# Patient Record
Sex: Female | Born: 1990 | Race: Black or African American | Hispanic: No | Marital: Single | Smoking: Former smoker
Health system: Southern US, Community
[De-identification: ages and names within clinical notes are randomized; demographics above are authoritative.]

## PROBLEM LIST (undated history)

## (undated) ENCOUNTER — Inpatient Hospital Stay (HOSPITAL_COMMUNITY): Payer: Self-pay

## (undated) DIAGNOSIS — B999 Unspecified infectious disease: Secondary | ICD-10-CM

## (undated) DIAGNOSIS — R87629 Unspecified abnormal cytological findings in specimens from vagina: Secondary | ICD-10-CM

## (undated) DIAGNOSIS — A749 Chlamydial infection, unspecified: Secondary | ICD-10-CM

## (undated) DIAGNOSIS — N390 Urinary tract infection, site not specified: Secondary | ICD-10-CM

## (undated) DIAGNOSIS — I2699 Other pulmonary embolism without acute cor pulmonale: Secondary | ICD-10-CM

## (undated) DIAGNOSIS — I10 Essential (primary) hypertension: Secondary | ICD-10-CM

## (undated) HISTORY — PX: COLPOSCOPY: SHX161

## (undated) HISTORY — DX: Morbid (severe) obesity due to excess calories: E66.01

---
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
## 2009-04-24 ENCOUNTER — Emergency Department (HOSPITAL_COMMUNITY): Admission: EM | Admit: 2009-04-24 | Discharge: 2009-04-24 | Payer: Self-pay | Admitting: Emergency Medicine

## 2009-06-18 ENCOUNTER — Emergency Department (HOSPITAL_BASED_OUTPATIENT_CLINIC_OR_DEPARTMENT_OTHER): Admission: EM | Admit: 2009-06-18 | Discharge: 2009-06-18 | Payer: Self-pay | Admitting: Emergency Medicine

## 2009-06-18 ENCOUNTER — Ambulatory Visit: Payer: Self-pay | Admitting: Interventional Radiology

## 2010-09-17 NOTE — H&P (Signed)
Faith [name], Faith [name] NO.:  [id number]   MEDICAL RECORD NO.:  [id number]          PATIENT TYPE:  INP   LOCATION:  9372                          [facility]ILITY:  [facility]   PHYSICIAN:  Kathreen Cosier, M.D.DATE OF BIRTH:  04-25-1991   DATE OF ADMISSION:  12/21/2006  DATE OF DISCHARGE:                              HISTORY & PHYSICAL   The patient is a 20 year old primigravida, ADC December 19, 2006.  She was  admitted in labor.  GBS was negative.  At 7:20 a.m. on December 21, 2006,  her cervix was 6 cm, 100%, vertex -1.  Membranes were artificially  ruptured.  Her fluid was clear.  An IUPC was inserted.  She had an  epidural at that time.  The patient, at 11:55 a.m., was __________  and  molded to +1 station.  Potassium was 3.1.  She was started on 40 mEq of  potassium IV.  Her diastolic blood pressure was up to 103, and she was  started on magnesium sulfate 4 g loading, 2 g per hour.  PIH labs were  normal.  By 4 p.m., she had pushed for 2 hours and 40 minutes and had  not brought it down beyond a +1-2 station with a lot of molding.  It was  decided that she would delivery by C-section for CPD.   PHYSICAL EXAMINATION:  GENERAL:  Well-developed female in labor.  HEENT:  Negative.  LUNGS:  Clear.  HEART:  Regular rhythm.  No murmurs or gallops.  BREASTS:  No masses.  ABDOMEN:  Term.  Estimated fetal weight 7 pounds 7 ounces.  PELVIC:  As described above.  EXTREMITIES:  Negative.           ______________________________  Kathreen Cosier, M.D.     BAM/MEDQ  D:  12/21/2006  T:  12/22/2006  Job:  [id number]

## 2010-09-17 NOTE — Op Note (Signed)
NAMEANGLES, TREVIZO NO.:  [id number]   MEDICAL RECORD NO.:  [id number]          PATIENT TYPE:  INP   LOCATION:  9372                          [facility]   PHYSICIAN:  Kathreen Cosier, M.D.DATE OF BIRTH:  06-Jun-1990   DATE OF PROCEDURE:  12/21/2006  DATE OF DISCHARGE:                               OPERATIVE REPORT   PREOPERATIVE DIAGNOSIS:  Cephalopelvic disproportion.   POSTOPERATIVE DIAGNOSIS:   OPERATION PERFORMED:   SURGEON:  Kathreen Cosier, M.D.   ANESTHESIA:  Epidural.   DESCRIPTION OF PROCEDURE:  Patient placed on the operating table in  supine position.  Abdomen prepped and draped.  Bladder emptied with  Foley catheter.  Transverse suprapubic incision made and carried down to  the rectus fascia.  Fascia cleaned and incised the length of the  incision.  Recti muscles were retracted laterally.  Peritoneum incised  longitudinally.  Transverse incision made in viscera peritoneum over the  bladder.  Bladder mobilized and retrieved.  Transverse lower uterine  incision made.  It was not possible to deliver the baby abdominally, so  the patient's legs were frogged and the nurse pushed on the vertex from  below until delivery was effected above.  She had a female, Apgars 9 and  9 weighing 8 pounds 15 ounces from the OP position.  Fluid was clear.  Team was in attendance.  Placenta was anterior mobile, sent to labor and  delivery.  Uterine cavity clean and dry.  Uterine incision closed in one  layer with continuous suture of #1 chromic.  Hemostasis satisfactory.  Bladder flap reattached with 2-0 chromic.  Uterus was contracted.  Tubes  and ovaries normal.  Abdomen closed in layers, peritoneum continuous  suture of 0 chromic, fascia continuous suture 0 Dexon.  Skin closed with  subcuticular stitch of 4-0 Monocryl. Blood loss was 500 mL.  The patient  tolerated procedure well, taken to recovery room in good condition.     ______________________________  Kathreen Cosier, M.D.     BAM/MEDQ  D:  12/21/2006  T:  12/22/2006  Job:  [id number]

## 2010-09-20 NOTE — Discharge Summary (Signed)
NAMETYNETTA, BACHMANN NO.:  [id number]   MEDICAL RECORD NO.:  [id number]          PATIENT TYPE:  INP   LOCATION:  9126                          [facility]   PHYSICIAN:  Kathreen Cosier, M.D.DATE OF BIRTH:  07-13-1990   DATE OF ADMISSION:  12/21/2006  DATE OF DISCHARGE:  12/24/2006                               DISCHARGE SUMMARY   Patient is a 20 year old primigravida, EDC December 19, 2006; negative  GBS, who was admitted in labor, 6 cm, 100%, vertex, -1.  Patient  developed PIH and was started on magnesium sulfate 4 g loading, 2 g per  hour.  After she became fully dilated, she pushed for two hours to +2  station and there was no progress.  She was delivered by C-section of a  female, Apgars 9 and 9, weight 8 pounds 15 ounces, from the OP position.  Postoperative hemoglobin was 7.9 and diastolics were between 90 and 70  and her magnesium sulfate was continued until day #2, when she was  started on ferrous sulfate twice daily.  She did well and the third day  she was discharged home on Tylox one p.o. q. 4 hours p.r.n., ferrous  sulfate one p.o. daily.   DISCHARGE DIAGNOSES:  Status post primary low transverse cesarean  section, term, for cephalopelvic disproportion and pregnancy-induced  hypertension.           ______________________________  Kathreen Cosier, M.D.     BAM/MEDQ  D:  01/20/2007  T:  01/20/2007  Job:  [id number]

## 2011-02-03 LAB — DIFFERENTIAL
Basophils Absolute: 0
Basophils Relative: 0
Lymphocytes Relative: 11 — ABNORMAL LOW
Monocytes Absolute: 0.6
Monocytes Relative: 5
Neutro Abs: 10.2 — ABNORMAL HIGH
Neutrophils Relative %: 84 — ABNORMAL HIGH

## 2011-02-03 LAB — RAPID URINE DRUG SCREEN, HOSP PERFORMED
Amphetamines: NOT DETECTED
Barbiturates: NOT DETECTED
Benzodiazepines: NOT DETECTED
Opiates: NOT DETECTED
Tetrahydrocannabinol: NOT DETECTED

## 2011-02-03 LAB — RAPID HIV SCREEN (WH-MAU): Rapid HIV Screen: NONREACTIVE

## 2011-02-03 LAB — URINALYSIS, ROUTINE W REFLEX MICROSCOPIC
Ketones, ur: 40 — AB
Nitrite: POSITIVE — AB
Protein, ur: 100 — AB
Specific Gravity, Urine: 1.02
Urobilinogen, UA: 1

## 2011-02-03 LAB — COMPREHENSIVE METABOLIC PANEL
ALT: 9
AST: 17
Alkaline Phosphatase: 180 — ABNORMAL HIGH
CO2: 21
Calcium: 9
Chloride: 106
GFR calc Af Amer: >60
GFR calc non Af Amer: >60
Potassium: 3.9
Sodium: 135

## 2011-02-03 LAB — RPR: RPR Ser Ql: NONREACTIVE

## 2011-02-03 LAB — CBC
HCT: 27.1 — ABNORMAL LOW
Hemoglobin: 8.7 — ABNORMAL LOW
MCV: 78.7
Platelets: 197
RBC: 3.44 — ABNORMAL LOW
RBC: 4.44
WBC: 12.2 — ABNORMAL HIGH
WBC: 14.9 — ABNORMAL HIGH

## 2011-02-03 LAB — RUBELLA SCREEN: Rubella: 43.3 — ABNORMAL HIGH

## 2011-02-03 LAB — URINE CULTURE: Colony Count: >=100000

## 2011-02-03 LAB — URINE MICROSCOPIC-ADD ON

## 2011-02-03 LAB — TYPE AND SCREEN

## 2011-02-14 LAB — URINE MICROSCOPIC-ADD ON

## 2011-02-14 LAB — COMPREHENSIVE METABOLIC PANEL
ALT: <8
ALT: <8
AST: 20
AST: 24
Albumin: 1.9 — ABNORMAL LOW
Albumin: 2.8 — ABNORMAL LOW
Alkaline Phosphatase: 118
BUN: 1 — ABNORMAL LOW
CO2: 22
Calcium: 9.1
Chloride: 105
Chloride: 107
Chloride: 108
Creatinine, Ser: 0.47
Creatinine, Ser: 0.59
Creatinine, Ser: 0.71
Potassium: 3.7
Sodium: 137
Sodium: 138
Total Bilirubin: 0.5
Total Bilirubin: 0.6
Total Bilirubin: 0.6

## 2011-02-14 LAB — URINALYSIS, ROUTINE W REFLEX MICROSCOPIC
Glucose, UA: NEGATIVE
Ketones, ur: NEGATIVE
Protein, ur: >300 — AB

## 2011-02-14 LAB — CBC
HCT: 30.3 — ABNORMAL LOW
Hemoglobin: 10.4 — ABNORMAL LOW
MCHC: 33.6
MCHC: 33.7
MCV: 81.1 — ABNORMAL LOW
Platelets: 179
RBC: 3.73 — ABNORMAL LOW
RBC: 3.8
WBC: 11.4 — ABNORMAL HIGH
WBC: 13.1 — ABNORMAL HIGH
WBC: 23.1 — ABNORMAL HIGH

## 2011-02-14 LAB — RPR: RPR Ser Ql: NONREACTIVE

## 2011-02-14 LAB — URINE CULTURE: Colony Count: 4000

## 2011-02-14 LAB — LACTATE DEHYDROGENASE
LDH: 123
LDH: 297 — ABNORMAL HIGH

## 2011-02-14 LAB — URIC ACID: Uric Acid, Serum: 5.3

## 2011-04-14 ENCOUNTER — Encounter (HOSPITAL_COMMUNITY): Payer: Self-pay | Admitting: *Deleted

## 2011-04-14 ENCOUNTER — Inpatient Hospital Stay (HOSPITAL_COMMUNITY)
Admission: AD | Admit: 2011-04-14 | Discharge: 2011-04-14 | Disposition: A | Discharge status: Home / Self Care | Payer: Self-pay | Source: Ambulatory Visit | Attending: Obstetrics & Gynecology | Admitting: Obstetrics & Gynecology

## 2011-04-14 DIAGNOSIS — R102 Pelvic and perineal pain: Secondary | ICD-10-CM

## 2011-04-14 DIAGNOSIS — N72 Inflammatory disease of cervix uteri: Secondary | ICD-10-CM | POA: Insufficient documentation

## 2011-04-14 DIAGNOSIS — N949 Unspecified condition associated with female genital organs and menstrual cycle: Secondary | ICD-10-CM | POA: Insufficient documentation

## 2011-04-14 DIAGNOSIS — Z30432 Encounter for removal of intrauterine contraceptive device: Secondary | ICD-10-CM | POA: Insufficient documentation

## 2011-04-14 DIAGNOSIS — A5901 Trichomonal vulvovaginitis: Secondary | ICD-10-CM | POA: Insufficient documentation

## 2011-04-14 DIAGNOSIS — R109 Unspecified abdominal pain: Secondary | ICD-10-CM | POA: Insufficient documentation

## 2011-04-14 HISTORY — DX: Urinary tract infection, site not specified: N39.0

## 2011-04-14 HISTORY — DX: Essential (primary) hypertension: I10

## 2011-04-14 HISTORY — DX: Chlamydial infection, unspecified: A74.9

## 2011-04-14 LAB — URINE MICROSCOPIC-ADD ON

## 2011-04-14 LAB — DIFFERENTIAL
Basophils Absolute: 0 10*3/uL (ref 0.0–0.1)
Basophils Relative: 0 % (ref 0–1)
Neutro Abs: 5.1 10*3/uL (ref 1.7–7.7)
Neutrophils Relative %: 58 % (ref 43–77)

## 2011-04-14 LAB — URINALYSIS, ROUTINE W REFLEX MICROSCOPIC
Bilirubin Urine: NEGATIVE
Glucose, UA: NEGATIVE mg/dL
Ketones, ur: NEGATIVE mg/dL
Nitrite: NEGATIVE
Protein, ur: NEGATIVE mg/dL
Specific Gravity, Urine: 1.01 (ref 1.005–1.030)
Urobilinogen, UA: 0.2 mg/dL (ref 0.0–1.0)
pH: 7 (ref 5.0–8.0)

## 2011-04-14 LAB — POCT PREGNANCY, URINE: Preg Test, Ur: NEGATIVE

## 2011-04-14 LAB — CBC
Hemoglobin: 12.4 g/dL (ref 12.0–15.0)
MCHC: 31.9 g/dL (ref 30.0–36.0)
Platelets: 277 10*3/uL (ref 150–400)
RDW: 13.7 % (ref 11.5–15.5)

## 2011-04-14 LAB — WET PREP, GENITAL: Yeast Wet Prep HPF POC: NONE SEEN

## 2011-04-14 MED ORDER — CEFTRIAXONE SODIUM 250 MG IJ SOLR
250.0000 mg | Freq: Once | INTRAMUSCULAR | Status: AC
  Administered 2011-04-14: 250 mg via INTRAMUSCULAR
  Filled 2011-04-14: qty 250

## 2011-04-14 MED ORDER — METRONIDAZOLE 500 MG PO TABS: 500.0000 mg | ORAL_TABLET | Freq: Two times a day (BID) | ORAL | Status: AC

## 2011-04-14 MED ORDER — AZITHROMYCIN 250 MG PO TABS
1000.0000 mg | ORAL_TABLET | Freq: Once | ORAL | Status: AC
  Administered 2011-04-14: 1000 mg via ORAL
  Filled 2011-04-14: qty 1
  Filled 2011-04-14: qty 3

## 2011-04-14 MED ORDER — KETOROLAC TROMETHAMINE 60 MG/2ML IM SOLN
60.0000 mg | Freq: Once | INTRAMUSCULAR | Status: AC
  Administered 2011-04-14: 60 mg via INTRAMUSCULAR
  Filled 2011-04-14: qty 2

## 2011-04-14 NOTE — ED Provider Notes (Signed)
History     CSN: [id number] Arrival date & time: 04/14/2011  3:06 PM   None     Chief Complaint  Patient presents with  . Abdominal Pain    HPI Faith [name] is a 20 y.o. female who presents to [facility] for [facility]. [facility] and has been having problems for the past 6 months with cramping and pain with sexual intercourse, spotting. Complains of nausea. The history was provided by the patient.  Past Medical History  Diagnosis Date  . Hypertension   . Urinary tract infection   . Chlamydia     Past Surgical History  Procedure Date  . Cesarean section     Family History  Problem Relation Age of Onset  . Anesthesia problems Neg Hx     History  Substance Use Topics  . Smoking status: Current Some Day Smoker -- 0.2 packs/day  . Smokeless tobacco: Never Used  . Alcohol Use: Yes     Every other weekend    OB History    Grav Para Term Preterm Abortions TAB SAB Ect Mult Living   2 2 2  0 0 0 0 0 0 2      Review of Systems  Constitutional: Negative for fever, chills, diaphoresis and fatigue.  HENT: Negative for ear pain, congestion, sore throat, facial swelling, neck pain, neck stiffness, dental problem and sinus pressure.   Eyes: Negative for photophobia, pain and discharge.  Respiratory: Negative for cough, chest tightness and wheezing.   Cardiovascular: Negative.   Gastrointestinal: Positive for nausea. Negative for vomiting, diarrhea, constipation and abdominal distention.       Cramping  Genitourinary: Positive for vaginal bleeding, vaginal discharge and pelvic pain. Negative for dysuria, frequency, flank pain and difficulty urinating.  Musculoskeletal: Positive for back pain. Negative for myalgias and gait problem.  Skin: Negative for color change and rash.  Neurological: Negative for dizziness, speech difficulty, weakness, light-headedness, numbness and headaches.  Psychiatric/Behavioral: Negative for confusion and  agitation. The patient is not nervous/anxious.     Allergies  Review of patient's allergies indicates no known allergies.  Home Medications  No current outpatient prescriptions on file.  BP 119/79  Pulse 86  Temp(Src) 98.5 F (36.9 C) (Oral)  Resp 20  Ht 5\' 2"  (1.575 m)  Wt 215 lb 9.6 oz (97.796 kg)  BMI 39.43 kg/m2  SpO2 99%  Physical Exam  Nursing note and vitals reviewed. Constitutional: She is oriented to person, place, and time. She appears well-developed and well-nourished.  HENT:  Head: Normocephalic.  Eyes: EOM are normal.  Neck: Neck supple.  Cardiovascular: Normal rate.   Pulmonary/Chest: Effort normal.  Abdominal: Soft. There is no tenderness.  Genitourinary:       External genitalia without lesions. Scant blood vaginal vault, yellow discharge, cervix friable, positive CMT, no adnexal mass palpated. Uterus without palpable enlargement. IUD string visualized.  Musculoskeletal: Normal range of motion.  Neurological: She is alert and oriented to person, place, and time. No cranial nerve deficit.  Skin: Skin is warm and dry.  Psychiatric: She has a normal mood and affect. Her behavior is normal. Judgment and thought content normal.   Assessment: Cervicitis   Pelvic pain   Trichomonas    Plan:  IUD removal   Rocephin 250 mg IM   Zithromax 1 gram po   Toradol 60 mg IM   GC, Chlamydia cultures pending   Rx Flagyl   ED Course  Procedures  Using ring forceps IUD removed without difficulty. Discussed in detail with patient need for alternative birth control since IUD has been removed. She will follow up with [facility].   MDM          Kerrie Buffalo, NP 04/14/11 [phone]

## 2011-04-14 NOTE — Progress Notes (Signed)
Patient states she has had a Mirena since 2009 that was placed by [facility]. Had an appointment today and was late and could not be seen. States she has been having abdominal pain for a while and wants the IUD taken out. Having pain with intercourse.

## 2011-04-15 LAB — GC/CHLAMYDIA PROBE AMP, GENITAL
Chlamydia, DNA Probe: POSITIVE — AB
GC Probe Amp, Genital: NEGATIVE

## 2011-04-16 ENCOUNTER — Telehealth (HOSPITAL_COMMUNITY): Payer: Self-pay | Admitting: Nurse Practitioner

## 2011-04-16 NOTE — Telephone Encounter (Signed)
Telephone call to patient regarding positive chlamydia culture, patient notified.  Patient was treated at time of visit.  Instructed patient to notify her partner for treatment.  Report of treatment [facility]ed to [facility].  

## 2011-04-17 ENCOUNTER — Other Ambulatory Visit: Payer: Self-pay | Admitting: Obstetrics & Gynecology

## 2011-04-17 ENCOUNTER — Telehealth: Payer: Self-pay | Admitting: *Deleted

## 2011-04-17 DIAGNOSIS — A749 Chlamydial infection, unspecified: Secondary | ICD-10-CM

## 2011-04-17 MED ORDER — AZITHROMYCIN 1 G PO PACK: 1.0000 | PACK | Freq: Once | ORAL | Status: AC

## 2011-04-17 NOTE — Telephone Encounter (Signed)
Message left for pt that I am calling with test results and treatment information. Please call back and leave a message of when we can speak with you.  *Note: pt needs to be informed of + chlamydia, Rx to be called in as prescribed and partner needs treatment.

## 2011-04-17 NOTE — Telephone Encounter (Signed)
Message copied by Jill Side on Thu Apr 17, 2011  4:10 PM ------      Message from: Adam Phenix      Created: Thu Apr 17, 2011  9:03 AM       Pos CT, needs tx with Zithromax, ordered

## 2011-04-22 NOTE — Telephone Encounter (Signed)
Upon chart review there is a note from [facility] stating that patient was already treated at the time of visit and was notified. See Tel Note from [facility].

## 2011-06-09 ENCOUNTER — Telehealth: Payer: Self-pay | Admitting: Obstetrics and Gynecology

## 2011-06-09 NOTE — Telephone Encounter (Signed)
Returned call to patient. No answer- left message that we are returning call and to call us back. In her message that she left; there was no request other than for [place] to call her back.

## 2011-06-10 NOTE — Telephone Encounter (Signed)
Called pt and left message indicating that this is our second attempt on returning her call.  If she still has questions or concerns to please give [place] a call and leave a message on our nurse callback line.

## 2011-06-15 ENCOUNTER — Encounter (HOSPITAL_COMMUNITY): Payer: Self-pay | Admitting: Adult Health

## 2011-06-15 ENCOUNTER — Emergency Department (HOSPITAL_COMMUNITY)
Admission: EM | Admit: 2011-06-15 | Discharge: 2011-06-15 | Disposition: A | Discharge status: Home / Self Care | Payer: Self-pay | Attending: Emergency Medicine | Admitting: Emergency Medicine

## 2011-06-15 DIAGNOSIS — H5789 Other specified disorders of eye and adnexa: Secondary | ICD-10-CM | POA: Insufficient documentation

## 2011-06-15 DIAGNOSIS — F17200 Nicotine dependence, unspecified, uncomplicated: Secondary | ICD-10-CM | POA: Insufficient documentation

## 2011-06-15 DIAGNOSIS — R300 Dysuria: Secondary | ICD-10-CM | POA: Insufficient documentation

## 2011-06-15 DIAGNOSIS — R599 Enlarged lymph nodes, unspecified: Secondary | ICD-10-CM | POA: Insufficient documentation

## 2011-06-15 DIAGNOSIS — J029 Acute pharyngitis, unspecified: Secondary | ICD-10-CM | POA: Insufficient documentation

## 2011-06-15 DIAGNOSIS — H109 Unspecified conjunctivitis: Secondary | ICD-10-CM | POA: Insufficient documentation

## 2011-06-15 DIAGNOSIS — I10 Essential (primary) hypertension: Secondary | ICD-10-CM | POA: Insufficient documentation

## 2011-06-15 DIAGNOSIS — H5710 Ocular pain, unspecified eye: Secondary | ICD-10-CM | POA: Insufficient documentation

## 2011-06-15 DIAGNOSIS — N390 Urinary tract infection, site not specified: Secondary | ICD-10-CM | POA: Insufficient documentation

## 2011-06-15 DIAGNOSIS — H579 Unspecified disorder of eye and adnexa: Secondary | ICD-10-CM | POA: Insufficient documentation

## 2011-06-15 DIAGNOSIS — R10819 Abdominal tenderness, unspecified site: Secondary | ICD-10-CM | POA: Insufficient documentation

## 2011-06-15 DIAGNOSIS — H02849 Edema of unspecified eye, unspecified eyelid: Secondary | ICD-10-CM | POA: Insufficient documentation

## 2011-06-15 LAB — URINALYSIS, ROUTINE W REFLEX MICROSCOPIC
Bilirubin Urine: NEGATIVE
Hgb urine dipstick: NEGATIVE
Specific Gravity, Urine: 1.032 — ABNORMAL HIGH (ref 1.005–1.030)
pH: 7 (ref 5.0–8.0)

## 2011-06-15 LAB — URINE MICROSCOPIC-ADD ON

## 2011-06-15 MED ORDER — PENICILLIN G BENZATHINE & PROC 1200000 UNIT/2ML IM SUSP: 1.2000 10*6.[IU] | Freq: Once | INTRAMUSCULAR | Status: DC

## 2011-06-15 MED ORDER — POLYMYXIN B-TRIMETHOPRIM 10000-0.1 UNIT/ML-% OP SOLN: 2.0000 [drp] | OPHTHALMIC | Status: AC

## 2011-06-15 MED ORDER — PHENAZOPYRIDINE HCL 200 MG PO TABS: 200.0000 mg | ORAL_TABLET | Freq: Three times a day (TID) | ORAL | Status: AC

## 2011-06-15 MED ORDER — PENICILLIN G BENZATHINE 1200000 UNIT/2ML IM SUSP: 1.2000 10*6.[IU] | Freq: Once | INTRAMUSCULAR | Status: DC

## 2011-06-15 MED ORDER — SULFAMETHOXAZOLE-TRIMETHOPRIM 800-160 MG PO TABS: 1.0000 | ORAL_TABLET | Freq: Two times a day (BID) | ORAL | Status: AC

## 2011-06-15 MED ORDER — PENICILLIN G BENZATHINE 1200000 UNIT/2ML IM SUSP
1.2000 10*6.[IU] | Freq: Once | INTRAMUSCULAR | Status: AC
  Administered 2011-06-15: 1.2 10*6.[IU] via INTRAMUSCULAR
  Filled 2011-06-15: qty 2

## 2011-06-15 NOTE — ED Provider Notes (Signed)
History     CSN: [id number]  Arrival date & time 06/15/11  1326   First MD Initiated Contact with Patient 06/15/11 1414      Chief Complaint  Patient presents with  . Sore Throat  . Conjunctivitis    (Consider location/radiation/quality/duration/timing/severity/associated sxs/prior treatment) HPI Comments: Patient reports she has had sore throat for one week, with pain with swallowing. She has also had dysuria for one week. Yesterday she began to have pain, itching, swelling and discharge from her right eye.  Patient states that her throat is sore but she is not having difficulty eating or drinking. Patient says she is having dysuria but is not having urinary frequency or urgency. She is also not having fevers, vomiting, abdominal pain, or abnormal vaginal discharge or bleeding. Last menstrual period was February 1.    Patient is a 21 y.o. female presenting with pharyngitis and conjunctivitis. The history is provided by the patient.  Sore Throat  Conjunctivitis   Sore Throat    Past Medical History  Diagnosis Date  . Hypertension   . Urinary tract infection   . Chlamydia     Past Surgical History  Procedure Date  . Cesarean section     Family History  Problem Relation Age of Onset  . Anesthesia problems Neg Hx     History  Substance Use Topics  . Smoking status: Current Some Day Smoker -- 0.2 packs/day  . Smokeless tobacco: Never Used  . Alcohol Use: Yes     Every other weekend    OB History    Grav Para Term Preterm Abortions TAB SAB Ect Mult Living   2 2 2  0 0 0 0 0 0 2      Review of Systems  All other systems reviewed and are negative.    Allergies  Review of patient's allergies indicates no known allergies.  Home Medications  No current outpatient prescriptions on file.  BP 123/62  Pulse 106  Temp(Src) 98 F (36.7 C) (Oral)  Resp 20  SpO2 100%  Physical Exam  Constitutional: She is oriented to person, place, and time. She appears  well-developed and well-nourished. No distress.  HENT:  Head: Normocephalic and atraumatic.  Mouth/Throat: Uvula is midline. No uvula swelling. Oropharyngeal exudate and posterior oropharyngeal erythema present. No posterior oropharyngeal edema or tonsillar abscesses.  Eyes: EOM are normal. Pupils are equal, round, and reactive to light. Right eye exhibits discharge and exudate. Left eye exhibits no discharge and no exudate. Right conjunctiva is injected. Right conjunctiva has no hemorrhage. Left conjunctiva is not injected. Left conjunctiva has no hemorrhage. No scleral icterus.       Right eye with eyelid edema, injected sclera, small amount of white discharge.    Neck: Trachea normal, normal range of motion and phonation normal. Neck supple. No tracheal tenderness present. No rigidity. No tracheal deviation and normal range of motion present.  Cardiovascular: Normal rate and regular rhythm.   Pulmonary/Chest: Effort normal and breath sounds normal. No stridor. No respiratory distress. She has no wheezes. She has no rales.  Abdominal: Soft. She exhibits no distension. There is tenderness in the suprapubic area. There is no rebound and no guarding.       Mild suprapubic tenderness  Lymphadenopathy:    She has cervical adenopathy.  Neurological: She is alert and oriented to person, place, and time.  Psychiatric: She has a normal mood and affect. Her behavior is normal. Judgment and thought content normal.    ED Course  Procedures (including critical care time)  Labs Reviewed  URINALYSIS, ROUTINE W REFLEX MICROSCOPIC - Abnormal; Notable for the following:    APPearance CLOUDY (*)    Specific Gravity, Urine 1.032 (*)    Leukocytes, UA SMALL (*)    All other components within normal limits  URINE MICROSCOPIC-ADD ON - Abnormal; Notable for the following:    Bacteria, UA MANY (*)    All other components within normal limits  RAPID STREP SCREEN  LAB REPORT - SCANNED   No results  found.   1. Pharyngitis   2. Conjunctivitis, right eye   3. UTI (lower urinary tract infection)       MDM  Nontoxic patient with upper respiratory symptoms for 1 week, now with right eye c/w conjunctivitis. Per Cent[facility] criteria - patient has tonsillar exudate, anterior cervical lymphadenopathy, and absence of cough - will treat patient for strep throat.  Patient also with dysuria, UA c/w UTI.  Pt denied any abnormal vaginal discharge or complaints and states she was just treated for GC/Chlamydia a few weeks ago, her partner was also treated at the same time and they abstained from sex for at least a week following treatment.  Pt discharged home.  Patient verbalizes understanding and agrees with plan.         Dillard Cannon [place], [place] 06/16/11 2154

## 2011-06-15 NOTE — ED Notes (Signed)
Sore throat for one week associated with a headache, redness, itching to right eye that began yesterday.

## 2011-06-17 NOTE — ED Provider Notes (Signed)
Medical screening examination/treatment/procedure(s) were performed by non-physician practitioner and as supervising physician I was immediately available for consultation/collaboration.  Doug Sou, MD 06/17/11 [phone]

## 2011-08-16 ENCOUNTER — Encounter (HOSPITAL_COMMUNITY): Payer: Self-pay | Admitting: Emergency Medicine

## 2011-08-16 ENCOUNTER — Emergency Department (HOSPITAL_COMMUNITY)
Admission: EM | Admit: 2011-08-16 | Discharge: 2011-08-16 | Disposition: A | Discharge status: Home / Self Care | Payer: Self-pay | Attending: Emergency Medicine | Admitting: Emergency Medicine

## 2011-08-16 DIAGNOSIS — IMO0002 Reserved for concepts with insufficient information to code with codable children: Secondary | ICD-10-CM | POA: Insufficient documentation

## 2011-08-16 DIAGNOSIS — Y9229 Other specified public building as the place of occurrence of the external cause: Secondary | ICD-10-CM | POA: Insufficient documentation

## 2011-08-16 DIAGNOSIS — S1093XA Contusion of unspecified part of neck, initial encounter: Secondary | ICD-10-CM | POA: Insufficient documentation

## 2011-08-16 DIAGNOSIS — R51 Headache: Secondary | ICD-10-CM | POA: Insufficient documentation

## 2011-08-16 DIAGNOSIS — S0003XA Contusion of scalp, initial encounter: Secondary | ICD-10-CM | POA: Insufficient documentation

## 2011-08-16 DIAGNOSIS — S0990XA Unspecified injury of head, initial encounter: Secondary | ICD-10-CM | POA: Insufficient documentation

## 2011-08-16 MED ORDER — NAPROXEN 500 MG PO TABS: 500.0000 mg | ORAL_TABLET | Freq: Two times a day (BID) | ORAL | Status: DC

## 2011-08-16 MED ORDER — IBUPROFEN 800 MG PO TABS
800.0000 mg | ORAL_TABLET | Freq: Once | ORAL | Status: AC
  Administered 2011-08-16: 800 mg via ORAL
  Filled 2011-08-16: qty 1
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
## 2011-08-20 ENCOUNTER — Encounter (HOSPITAL_COMMUNITY): Payer: Self-pay | Admitting: *Deleted

## 2011-08-20 ENCOUNTER — Emergency Department (HOSPITAL_COMMUNITY): Payer: Self-pay

## 2011-08-20 ENCOUNTER — Emergency Department (HOSPITAL_COMMUNITY)
Admission: EM | Admit: 2011-08-20 | Discharge: 2011-08-20 | Disposition: A | Discharge status: Home / Self Care | Payer: Self-pay | Attending: Emergency Medicine | Admitting: Emergency Medicine

## 2011-08-20 DIAGNOSIS — F17200 Nicotine dependence, unspecified, uncomplicated: Secondary | ICD-10-CM | POA: Insufficient documentation

## 2011-08-20 DIAGNOSIS — R42 Dizziness and giddiness: Secondary | ICD-10-CM | POA: Insufficient documentation

## 2011-08-20 DIAGNOSIS — S0990XA Unspecified injury of head, initial encounter: Secondary | ICD-10-CM | POA: Insufficient documentation

## 2011-08-20 DIAGNOSIS — IMO0002 Reserved for concepts with insufficient information to code with codable children: Secondary | ICD-10-CM | POA: Insufficient documentation

## 2011-08-20 DIAGNOSIS — R110 Nausea: Secondary | ICD-10-CM | POA: Insufficient documentation

## 2011-08-20 DIAGNOSIS — I10 Essential (primary) hypertension: Secondary | ICD-10-CM | POA: Insufficient documentation

## 2011-08-20 DIAGNOSIS — R51 Headache: Secondary | ICD-10-CM | POA: Insufficient documentation

## 2011-08-20 MED ORDER — OXYCODONE-ACETAMINOPHEN 5-325 MG PO TABS
1.0000 | ORAL_TABLET | Freq: Once | ORAL | Status: AC
  Administered 2011-08-20: 1 via ORAL
  Filled 2011-08-20: qty 1

## 2011-08-20 MED ORDER — TRAMADOL HCL 50 MG PO TABS: 50.0000 mg | ORAL_TABLET | Freq: Four times a day (QID) | ORAL | Status: AC | PRN

## 2011-08-20 NOTE — Discharge Instructions (Signed)
RESOURCE GUIDE  Dental Problems  Patients with Medicaid: [facility]                     [facility] [address]                                           [address] Phone:  [phone]                                                  Phone:  [phone]  If unable to pay or uninsured, contact:  [facility] or [facility] to become qualified for the adult dental clinic.  Chronic Pain Problems Contact [facility]  [phone] Patients need to be referred by their primary care doctor.  Insufficient Money for Medicine Contact [employer]:  call "211" or [facility] 271-5999.  No Primary Care Doctor Call [facility]  832-8000 Other agencies that provide inexpensive medical care    [facility]  832-8035    [facility]  [phone]    [facility]  [phone]    Women[facility] Clinic  832-4777    [facility]  [phone]    [facility]  [phone]  Psychological Services [facility]  [phone] [facility]  [phone] [facility]   [phone] (emergency services 641-4993)  Substance Abuse Resources [facility]  336-882-2125 [facility] 336-784-9470 [facility] 336-285-9073 [facility] 336-845-3988 Residential & Outpatient Substance Abuse Program  800-659-3381  Abuse/Neglect [place] Child Abuse Hotline (336) 641-3795 [place] Child Abuse Hotline 800-378-5315 (After Hours)  Emergency Shelter [facility] (336) 271-5985  Maternity Homes [facility] (336) 275-9566 [facility] (704) 372-4663  MRSA Hotline #:   832-7006    [place] Resources  [facility] [place]     [employer]                          [facility] [address] [place]                       [address]      [address]  [place]                                                [place]                            [place] Phone:  [phone]                                   Phone:  [phone]                 Phone:  [phone]  [facility] Phone:  [phone]    [facility] Child Abuse Hotline 9041123495 908 064 3974 (After Hours)    Take the prescription as directed.  No sports for the next 2 weeks, until your are seen by your regular medical doctor in follow up.  Call your regular medical doctor today to schedule a follow up appointment within the next week.  Return to the Emergency Department immediately sooner if worsening.
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
## 2011-08-20 NOTE — Progress Notes (Signed)
56 F self pay Came in with headache since after 08/16/11 ED visit CT on 08/20/11 with normal results Pt listed as self pay with no insurance coverage Pt confirms she is self pay [place] resident.  CM and [facility] community liaison spoke with her Pt offered [facility] services to assist with finding a [place] provider Accepted [facility] information
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
## 2012-02-15 ENCOUNTER — Emergency Department (HOSPITAL_COMMUNITY)
Admission: EM | Admit: 2012-02-15 | Discharge: 2012-02-15 | Disposition: A | Discharge status: Home / Self Care | Payer: Self-pay | Attending: Emergency Medicine | Admitting: Emergency Medicine

## 2012-02-15 ENCOUNTER — Encounter (HOSPITAL_COMMUNITY): Payer: Self-pay | Admitting: Emergency Medicine

## 2012-02-15 DIAGNOSIS — N61 Mastitis without abscess: Secondary | ICD-10-CM | POA: Insufficient documentation

## 2012-02-15 DIAGNOSIS — I10 Essential (primary) hypertension: Secondary | ICD-10-CM | POA: Insufficient documentation

## 2012-02-15 DIAGNOSIS — F17200 Nicotine dependence, unspecified, uncomplicated: Secondary | ICD-10-CM | POA: Insufficient documentation

## 2012-02-15 DIAGNOSIS — L0390 Cellulitis, unspecified: Secondary | ICD-10-CM

## 2012-02-15 MED ORDER — CEPHALEXIN 500 MG PO CAPS: 500.0000 mg | ORAL_CAPSULE | Freq: Four times a day (QID) | ORAL | Status: DC

## 2012-02-15 MED ORDER — SULFAMETHOXAZOLE-TRIMETHOPRIM 800-160 MG PO TABS: 1.0000 | ORAL_TABLET | Freq: Two times a day (BID) | ORAL | Status: DC

## 2012-02-15 NOTE — ED Notes (Signed)
Pt. Stated, I have a knot that is painful on my rt. Breast for 2 days

## 2012-02-15 NOTE — ED Notes (Signed)
Pt discharged to home with prescriptions. Pt voiced understanding of discharge instructions. NAD 

## 2012-02-15 NOTE — ED Provider Notes (Signed)
History   This chart was scribed for Faith Kaplan, MD by Faith [name] . The patient was seen in room TR07C/TR07C. Patient's care was started at 1409.    CSN: [id number]  Arrival date & time 02/15/12  1409   First MD Initiated Contact with Patient 02/15/12 1424      Chief Complaint  Patient presents with  . Abscess    (Consider location/radiation/quality/duration/timing/severity/associated sxs/prior treatment) HPI Comments: Faith [name] is a 21 y.o. female who presents to the Emergency Department complaining of lesion on her right breast that began 2 days ago. Patient reports no h/o similar symptoms. She reports associated symptoms of nausea, pain, erythema. She denies any vomiting, drainage, itching, fever and chills. Patients states the abscess changes in size, fluctuating with different positions of her breast. Patient denies history of yeast infections, fungal infections around breast or abscess. She is not currently pregnant or breast feeding. No family h/o breast cancer.     Patient is a 21 y.o. female presenting with abscess. The history is provided by the patient. No language interpreter was used.  Abscess  This is a new problem. The current episode started yesterday. The onset was gradual. The abscess is present on the torso. The problem is mild. Pertinent negatives include no fever and no vomiting.    Past Medical History  Diagnosis Date  . Hypertension   . Urinary tract infection   . Chlamydia     Past Surgical History  Procedure Date  . Cesarean section     Family History  Problem Relation Age of Onset  . Anesthesia problems Neg Hx     History  Substance Use Topics  . Smoking status: Current Some Day Smoker -- 0.2 packs/day  . Smokeless tobacco: Never Used  . Alcohol Use: Yes     Every other weekend    OB History    Grav Para Term Preterm Abortions TAB SAB Ect Mult Living   2 2 2  0 0 0 0 0 0 2      Review of Systems    Constitutional: Negative for fever and chills.  Respiratory: Negative for shortness of breath.   Cardiovascular: Negative for chest pain.  Gastrointestinal: Positive for nausea. Negative for vomiting.  Skin: Positive for wound.  Neurological: Negative for weakness.  All other systems reviewed and are negative.    Allergies  Review of patient's allergies indicates no known allergies.  Home Medications  No current outpatient prescriptions on file.  BP 120/57  Pulse 73  Temp 98.3 F (36.8 C) (Oral)  Resp 15  SpO2 98%  LMP 02/10/2012  Physical Exam  Nursing note and vitals reviewed. Constitutional: She is oriented to person, place, and time. She appears well-developed and well-nourished. No distress.  HENT:  Head: Normocephalic and atraumatic.  Eyes: EOM are normal.  Neck: Neck supple. No tracheal deviation present.  Cardiovascular: Normal rate, regular rhythm and normal heart sounds.   No murmur heard. Pulmonary/Chest: Effort normal and breath sounds normal. No respiratory distress. She has no wheezes. She has no rhonchi. She has no rales.       Lungs clear to auscultation bilaterally.   Musculoskeletal: Normal range of motion.  Lymphadenopathy:    She has no axillary adenopathy.  Neurological: She is alert and oriented to person, place, and time.  Skin: Skin is warm and dry.       On right breast over folds at 4 o'clock position, a 3 cm knot with mild overlying erythema.  Mild tenderness to palpation. No fluctuance. Lesion is soft but fixed.   Psychiatric: She has a normal mood and affect. Her behavior is normal.    ED Course  Procedures (including critical care time)  DIAGNOSTIC STUDIES: Oxygen Saturation is 98% on room air, normal by my interpretation.    COORDINATION OF CARE:  14:40-Discussed planned course of treatment with the patient including d/c home with antibiotics, who is agreeable at this time. Discussed f/u with PCP or ED for worsening symptoms.      Labs Reviewed - No data to display No results found.   No diagnosis found.    MDM  Medical screening examination/treatment/procedure(s) were performed by me as the supervising physician. Scribe service was utilized for documentation only.  Pt comes in with cc of nodule to the right breast. The nodule doesn't appear cancerous, although i explained to the patient that she needs to see a primary care doctor for a better answer on whether she has cancer or not, The nodule is tender, there is some erythema, no fluctuance - so i think she has a mild infection no abscess that is discernable on exam.  The plan is to start her on AB. She has no pcp, so i have advised her that if the symptoms get worse, or have not resolved in 2 weeks, she should return to the ER, as we might have to get an Ultrasound or specialist to see her for this cosmetically sensitive area evaluation.        Faith Kaplan, MD 02/15/12 1507

## 2012-02-16 ENCOUNTER — Inpatient Hospital Stay (HOSPITAL_COMMUNITY)
Admission: AD | Admit: 2012-02-16 | Discharge: 2012-02-16 | Disposition: A | Discharge status: Home / Self Care | Payer: Self-pay | Source: Ambulatory Visit | Attending: Obstetrics and Gynecology | Admitting: Obstetrics and Gynecology

## 2012-02-16 ENCOUNTER — Encounter (HOSPITAL_COMMUNITY): Payer: Self-pay | Admitting: *Deleted

## 2012-02-16 DIAGNOSIS — N6459 Other signs and symptoms in breast: Secondary | ICD-10-CM | POA: Insufficient documentation

## 2012-02-16 DIAGNOSIS — N61 Mastitis without abscess: Secondary | ICD-10-CM | POA: Insufficient documentation

## 2012-02-16 MED ORDER — HYDROCODONE-ACETAMINOPHEN 5-325 MG PO TABS: 1.0000 | ORAL_TABLET | Freq: Four times a day (QID) | ORAL | Status: DC | PRN

## 2012-02-16 NOTE — MAU Note (Signed)
Pt states she has had extreme pain & tenderness in her R breast x 2 days, was seen @ [facility] last night, does not feel like her questions were answered.  Unsure if pregnant.

## 2012-02-16 NOTE — MAU Note (Signed)
Pt has a swollen area under her right breast and it is warm to touch.

## 2012-02-16 NOTE — MAU Provider Note (Signed)
History     CSN: [id number]  Arrival date and time: 02/16/12 1157   First Provider Initiated Contact with Patient 02/16/12 1244      Chief Complaint  Patient presents with  . Breast Pain   HPI 21 y.o. [id number] with painful erythematous "lump" under right breast, seen at [facility] ED last night for same c/o, diagnosed with skin infection, given rx for two antibiotics, started those today.    Past Medical History  Diagnosis Date  . Hypertension   . Urinary tract infection   . Chlamydia     Past Surgical History  Procedure Date  . Cesarean section     Family History  Problem Relation Age of Onset  . Anesthesia problems Neg Hx     History  Substance Use Topics  . Smoking status: Current Some Day Smoker -- 0.2 packs/day  . Smokeless tobacco: Never Used  . Alcohol Use: Yes     Every other weekend    Allergies: No Known Allergies  Prescriptions prior to admission  Medication Sig Dispense Refill  . cephALEXin (KEFLEX) 500 MG capsule Take 1 capsule (500 mg total) by mouth 4 (four) times daily.  40 capsule  0  . sulfamethoxazole-trimethoprim (SEPTRA DS) 800-160 MG per tablet Take 1 tablet by mouth 2 (two) times daily.  28 tablet  0    Review of Systems  Constitutional: Negative.   Respiratory: Negative.   Cardiovascular: Negative.   Gastrointestinal: Negative for nausea, vomiting, abdominal pain, diarrhea and constipation.  Genitourinary: Negative for dysuria, urgency, frequency, hematuria and flank pain.       Negative for vaginal bleeding, vaginal discharge  Musculoskeletal: Negative.   Neurological: Negative.   Psychiatric/Behavioral: Negative.    Physical Exam   Blood pressure 171/92, pulse 96, temperature 97.1 F (36.2 C), temperature source Oral, resp. rate 18, height 5\' 1"  (1.549 m), weight 216 lb 3.2 oz (98.068 kg), last menstrual period 02/10/2012.  Physical Exam  Nursing note and vitals reviewed. Constitutional: She appears well-developed and  well-nourished. No distress.  Cardiovascular: Normal rate.   Respiratory: Effort normal.    Skin: Skin is warm and dry.    MAU Course  Procedures  Results for orders placed during the hospital encounter of 02/16/12 (from the past 24 hour(s))  POCT PREGNANCY, URINE     Status: Normal   Collection Time   02/16/12 12:17 PM      Component Value Range   Preg Test, Ur NEGATIVE  NEGATIVE     Assessment and Plan   1. Breast infection in female       Medication List     As of 02/16/2012  2:53 PM    START taking these medications         HYDROcodone-acetaminophen 5-325 MG per tablet   Commonly known as: NORCO/VICODIN   Take 1 tablet by mouth every 6 (six) hours as needed for pain.      CONTINUE taking these medications         cephALEXin 500 MG capsule   Commonly known as: KEFLEX   Take 1 capsule (500 mg total) by mouth 4 (four) times daily.      sulfamethoxazole-trimethoprim 800-160 MG per tablet   Commonly known as: BACTRIM DS,SEPTRA DS   Take 1 tablet by mouth 2 (two) times daily.          Where to get your medications    These are the prescriptions that you need to pick up.   You may get  these medications from any pharmacy.         HYDROcodone-acetaminophen 5-325 MG per tablet            Follow-up Information    Follow up with [facility]-EMERGENCY DEPT. (As needed)    Contact information:   [address] [id number] mc [place] [address] [phone]           FRAZIER,NATALIE 02/16/2012, 12:45 PM

## 2012-02-17 NOTE — MAU Provider Note (Signed)
Attestation of Attending Supervision of Advanced Practitioner (CNM/NP): Evaluation and management procedures were performed by the Advanced Practitioner under my supervision and collaboration.  I have reviewed the Advanced Practitioner's note and chart, and I agree with the management and plan.  Brisia Schuermann 02/17/2012 5:20 AM

## 2012-07-25 ENCOUNTER — Encounter (HOSPITAL_COMMUNITY): Payer: Self-pay | Admitting: *Deleted

## 2012-07-25 ENCOUNTER — Inpatient Hospital Stay (HOSPITAL_COMMUNITY)
Admission: AD | Admit: 2012-07-25 | Discharge: 2012-07-26 | Disposition: A | Discharge status: Home / Self Care | Payer: Self-pay | Source: Ambulatory Visit | Attending: Obstetrics and Gynecology | Admitting: Obstetrics and Gynecology

## 2012-07-25 DIAGNOSIS — O98819 Other maternal infectious and parasitic diseases complicating pregnancy, unspecified trimester: Secondary | ICD-10-CM | POA: Insufficient documentation

## 2012-07-25 DIAGNOSIS — A5901 Trichomonal vulvovaginitis: Secondary | ICD-10-CM

## 2012-07-25 DIAGNOSIS — R109 Unspecified abdominal pain: Secondary | ICD-10-CM | POA: Insufficient documentation

## 2012-07-25 LAB — URINALYSIS, ROUTINE W REFLEX MICROSCOPIC
Bilirubin Urine: NEGATIVE
Glucose, UA: NEGATIVE mg/dL
Hgb urine dipstick: NEGATIVE
Leukocytes, UA: NEGATIVE
Specific Gravity, Urine: >1.03 — ABNORMAL HIGH (ref 1.005–1.030)
Urobilinogen, UA: 0.2 mg/dL (ref 0.0–1.0)
pH: 6 (ref 5.0–8.0)

## 2012-07-25 NOTE — MAU Note (Signed)
Pt LMP 06/25/2012, having nausea after eating, fatigue, upper and lower abd pain and pressure and elevated blood pressure.

## 2012-07-25 NOTE — MAU Note (Addendum)
PT SAYS  SHE HAS BEEN NAUSEATED X2 WEEKS - BUT NO VOMITING.    CRAMPING   STARTED ON Friday-  DID NOT FEEL ENOUGH PAIN TO TAKE MED- CRAMPS - NOT CONSTANT.  HAD DIZZINESS ON MON , WED , AND Friday-  .  FEELS TIRED. LAST SEX WAS  Tuesday-    NO BIRTH CONTROL-  BECAUSE OF BP.   HOME PREG TEST ON Friday-  NEG.   ON Friday- NIGHT  HAD 1 SPOT OF LIGHT PINK WHEN SHE WIPED.

## 2012-07-25 NOTE — MAU Provider Note (Signed)
History     CSN: [id number]  Arrival date and time: 07/25/12 2156   First Provider Initiated Contact with Patient 07/25/12 2358      Chief Complaint  Patient presents with  . Fatigue  . Abdominal Pain  . Nausea  . Hypertension   HPI Faith [name] is a 22 y.o. female who presents to MAU with abdominal pain @ 4.[redacted] weeks gestation. The pain started about a week ago. She describes the pain as cramping like a period. The pain comes and goes. At worst the pain is 7/10, currently there is no pain. Patient's last menstrual period was 06/25/2012.   OB History   Grav Para Term Preterm Abortions TAB SAB Ect Mult Living   2 2 2  0 0 0 0 0 0 2      Past Medical History  Diagnosis Date  . Hypertension   . Urinary tract infection   . Chlamydia     Past Surgical History  Procedure Laterality Date  . Cesarean section      Family History  Problem Relation Age of Onset  . Anesthesia problems Neg Hx     History  Substance Use Topics  . Smoking status: Current Some Day Smoker -- 0.25 packs/day  . Smokeless tobacco: Never Used  . Alcohol Use: Yes     Comment: Every other weekend- wine    Allergies: No Known Allergies  Prescriptions prior to admission  Medication Sig Dispense Refill  . cephALEXin (KEFLEX) 500 MG capsule Take 1 capsule (500 mg total) by mouth 4 (four) times daily.  40 capsule  0  . HYDROcodone-acetaminophen (NORCO/VICODIN) 5-325 MG per tablet Take 1 tablet by mouth every 6 (six) hours as needed for pain.  10 tablet  0  . sulfamethoxazole-trimethoprim (SEPTRA DS) 800-160 MG per tablet Take 1 tablet by mouth 2 (two) times daily.  28 tablet  0    Review of Systems  Constitutional: Negative for fever and chills. Malaise/fatigue: feeling tired.  Eyes: Negative for blurred vision.  Respiratory: Negative for cough and wheezing.   Cardiovascular: Negative for chest pain and palpitations.  Gastrointestinal: Positive for nausea and abdominal pain (cramping).  Negative for vomiting.  Musculoskeletal: Negative for back pain.  Skin: Negative for rash.  Neurological: Negative for dizziness and headaches.  Psychiatric/Behavioral: Negative for depression. The patient is not nervous/anxious.    Blood pressure 126/63, pulse 89, temperature 98.3 F (36.8 C), resp. rate 18, height 5\' 1"  (1.549 m), weight 235 lb 3.2 oz (106.686 kg), last menstrual period 06/25/2012.  Physical Exam  Nursing note and vitals reviewed. Constitutional: She is oriented to person, place, and time. She appears well-developed and well-nourished. No distress.  HENT:  Head: Normocephalic and atraumatic.  Eyes: EOM are normal.  Neck: Neck supple.  Cardiovascular: Normal rate.   Respiratory: Effort normal.  GI: Soft. There is no tenderness.  Unable to reproduce the pain the patient has had.  Genitourinary:  External genitalia without lesions. White discharge vaginal vault. Cervix long, closed, no CMT, no adnexal tenderness. Uterus without palpable enlargement.  Musculoskeletal: Normal range of motion.  Neurological: She is alert and oriented to person, place, and time.  Skin: Skin is warm and dry.  Psychiatric: She has a normal mood and affect. Her behavior is normal. Judgment and thought content normal.   Results for orders placed during the hospital encounter of 07/25/12 (from the past 24 hour(s))  URINALYSIS, ROUTINE W REFLEX MICROSCOPIC     Status: Abnormal   Collection  Time    07/25/12 11:00 PM      Result Value Range   Color, Urine YELLOW  YELLOW   APPearance CLEAR  CLEAR   Specific Gravity, Urine >1.030 (*) 1.005 - 1.030   pH 6.0  5.0 - 8.0   Glucose, UA NEGATIVE  NEGATIVE mg/dL   Hgb urine dipstick NEGATIVE  NEGATIVE   Bilirubin Urine NEGATIVE  NEGATIVE   Ketones, ur NEGATIVE  NEGATIVE mg/dL   Protein, ur NEGATIVE  NEGATIVE mg/dL   Urobilinogen, UA 0.2  0.0 - 1.0 mg/dL   Nitrite NEGATIVE  NEGATIVE   Leukocytes, UA NEGATIVE  NEGATIVE  POCT PREGNANCY, URINE      Status: Abnormal   Collection Time    07/25/12 11:25 PM      Result Value Range   Preg Test, Ur POSITIVE (*) NEGATIVE  CBC WITH DIFFERENTIAL     Status: Abnormal   Collection Time    07/25/12 11:51 PM      Result Value Range   WBC 10.2  4.0 - 10.5 K/uL   RBC 4.42  3.87 - 5.11 MIL/uL   Hemoglobin 11.6 (*) 12.0 - 15.0 g/dL   HCT 16.1  09.6 - 04.5 %   MCV 82.8  78.0 - 100.0 fL   MCH 26.2  26.0 - 34.0 pg   MCHC 31.7  30.0 - 36.0 g/dL   RDW 40.9  81.1 - 91.4 %   Platelets 293  150 - 400 K/uL   Neutrophils Relative 60  43 - 77 %   Neutro Abs 6.1  1.7 - 7.7 K/uL   Lymphocytes Relative 34  12 - 46 %   Lymphs Abs 3.4  0.7 - 4.0 K/uL   Monocytes Relative 5  3 - 12 %   Monocytes Absolute 0.5  0.1 - 1.0 K/uL   Eosinophils Relative 2  0 - 5 %   Eosinophils Absolute 0.2  0.0 - 0.7 K/uL   Basophils Relative 0  0 - 1 %   Basophils Absolute 0.0  0.0 - 0.1 K/uL  HCG, QUANTITATIVE, PREGNANCY     Status: Abnormal   Collection Time    07/25/12 11:51 PM      Result Value Range   hCG, Beta Chain, Quant, S 86 (*) <5 mIU/mL  WET PREP, GENITAL     Status: Abnormal   Collection Time    07/26/12 12:10 AM      Result Value Range   Yeast Wet Prep HPF POC NONE SEEN  NONE SEEN   Trich, Wet Prep FEW (*) NONE SEEN   Clue Cells Wet Prep HPF POC NONE SEEN  NONE SEEN   WBC, Wet Prep HPF POC FEW (*) NONE SEEN    Assessment: 22 y.o. female with abdominal cramping by history    Pregnancy symptoms   Positive pregnancy test   Trichomonas vaginosis  Plan:  Rx Flagyl   Rx Phenergan   Return 2 days for Bhcg   Ectopic precautions   Discussed with the patient and all questioned fully answered. She will return  if any problems arise.    Medication List    STOP taking these medications       cephALEXin 500 MG capsule  Commonly known as:  KEFLEX     HYDROcodone-acetaminophen 5-325 MG per tablet  Commonly known as:  NORCO/VICODIN     sulfamethoxazole-trimethoprim 800-160 MG per tablet  Commonly known  as:  SEPTRA DS      TAKE these medications  metroNIDAZOLE 500 MG tablet  Commonly known as:  FLAGYL  Take 1 tablet (500 mg total) by mouth 2 (two) times daily.     promethazine 25 MG tablet  Commonly known as:  PHENERGAN  Take 0.5 tablets (12.5 mg total) by mouth every 6 (six) hours as needed for nausea.        Procedures  Tahmid Stonehocker, RN, FNP, [facility] 07/25/2012, 11:58 PM

## 2012-07-26 LAB — CBC WITH DIFFERENTIAL/PLATELET
Basophils Absolute: 0 10*3/uL (ref 0.0–0.1)
HCT: 36.6 % (ref 36.0–46.0)
Lymphocytes Relative: 34 % (ref 12–46)
Monocytes Absolute: 0.5 10*3/uL (ref 0.1–1.0)
Neutro Abs: 6.1 10*3/uL (ref 1.7–7.7)
Neutrophils Relative %: 60 % (ref 43–77)
RDW: 13.8 % (ref 11.5–15.5)
WBC: 10.2 10*3/uL (ref 4.0–10.5)

## 2012-07-26 LAB — HCG, QUANTITATIVE, PREGNANCY: hCG, Beta Chain, Quant, S: 86 m[IU]/mL — ABNORMAL HIGH (ref <5)

## 2012-07-26 LAB — WET PREP, GENITAL
Clue Cells Wet Prep HPF POC: NONE SEEN
Yeast Wet Prep HPF POC: NONE SEEN

## 2012-07-26 MED ORDER — PROMETHAZINE HCL 25 MG PO TABS: 12.5000 mg | ORAL_TABLET | Freq: Four times a day (QID) | ORAL | Status: DC | PRN

## 2012-07-26 MED ORDER — METRONIDAZOLE 500 MG PO TABS: 500.0000 mg | ORAL_TABLET | Freq: Two times a day (BID) | ORAL | Status: DC

## 2012-07-27 NOTE — MAU Provider Note (Signed)
Attestation of Attending Supervision of Advanced Practitioner: Evaluation and management procedures were performed by the PA/NP/CNM/OB Fellow under my supervision/collaboration. Chart reviewed and agree with management and plan.  Iris Tatsch V 07/27/2012 8:55 PM

## 2012-07-28 ENCOUNTER — Inpatient Hospital Stay (HOSPITAL_COMMUNITY)
Admission: AD | Admit: 2012-07-28 | Discharge: 2012-07-28 | Disposition: A | Discharge status: Home / Self Care | Payer: Self-pay | Source: Ambulatory Visit | Attending: Obstetrics & Gynecology | Admitting: Obstetrics & Gynecology

## 2012-07-28 ENCOUNTER — Encounter (HOSPITAL_COMMUNITY): Payer: Self-pay

## 2012-07-28 DIAGNOSIS — Z09 Encounter for follow-up examination after completed treatment for conditions other than malignant neoplasm: Secondary | ICD-10-CM | POA: Insufficient documentation

## 2012-07-28 DIAGNOSIS — O99891 Other specified diseases and conditions complicating pregnancy: Secondary | ICD-10-CM | POA: Insufficient documentation

## 2012-07-28 DIAGNOSIS — O200 Threatened abortion: Secondary | ICD-10-CM

## 2012-07-28 LAB — HCG, QUANTITATIVE, PREGNANCY: hCG, Beta Chain, Quant, S: 258 m[IU]/mL — ABNORMAL HIGH (ref <5)

## 2012-07-28 NOTE — MAU Provider Note (Signed)
  History     CSN: [id number]  Arrival date and time: 07/28/12 1622   None     Chief Complaint  Patient presents with  . Follow-up   HPI This is a 21 y.o. female at [redacted]w[redacted]d who presents for followup HCG level. She was seen 3 days ago and had a quant and pelvic exam done. Quant was 86.  RN Note: Patient to MAU for repeat BHCG. Patient denies any pain or bleeding.        OB History   Grav Para Term Preterm Abortions TAB SAB Ect Mult Living   3 2 2  0 0 0 0 0 0 2      Past Medical History  Diagnosis Date  . Hypertension   . Urinary tract infection   . Chlamydia     Past Surgical History  Procedure Laterality Date  . Cesarean section      Family History  Problem Relation Age of Onset  . Anesthesia problems Neg Hx     History  Substance Use Topics  . Smoking status: Current Some Day Smoker -- 0.25 packs/day  . Smokeless tobacco: Never Used  . Alcohol Use: Yes     Comment: Every other weekend- wine    Allergies: No Known Allergies  Prescriptions prior to admission  Medication Sig Dispense Refill  . metroNIDAZOLE (FLAGYL) 500 MG tablet Take 1 tablet (500 mg total) by mouth 2 (two) times daily.  14 tablet  0  . promethazine (PHENERGAN) 25 MG tablet Take 0.5 tablets (12.5 mg total) by mouth every 6 (six) hours as needed for nausea.  20 tablet  0    Review of Systems  Constitutional: Negative for fever, chills and malaise/fatigue.  Gastrointestinal: Negative for nausea, vomiting, abdominal pain, diarrhea and constipation.  Genitourinary: Negative for dysuria.  Musculoskeletal: Negative for myalgias.  Neurological: Negative for dizziness.   Physical Exam   Blood pressure 124/71, pulse 95, temperature 98 F (36.7 C), temperature source Oral, resp. rate 18, last menstrual period 06/25/2012, SpO2 100.00%.  Physical Exam  Constitutional: She is oriented to person, place, and time. She appears well-developed and well-nourished. No distress.  Cardiovascular:  Normal rate.   Respiratory: Effort normal.  Neurological: She is alert and oriented to person, place, and time.  Skin: Skin is warm and dry.  Psychiatric: She has a normal mood and affect.    MAU Course  Procedures  MDM Results for orders placed during the hospital encounter of 07/28/12 (from the past 24 hour(s))  HCG, QUANTITATIVE, PREGNANCY     Status: Abnormal   Collection Time    07/28/12  4:55 PM      Result Value Range   hCG, Beta Chain, Quant, S 258 (*) <5 mIU/mL   Quant 3 days ago was 86, this represents more than doubling  Assessment and Plan  A:  Pregnancy at [redacted]w[redacted]d       Doubling Quantitative HCG levels  P:  Plan repeat US in one week, when quant should be around 4000   [facility] 07/28/2012, 6:17 PM

## 2012-07-28 NOTE — MAU Note (Signed)
Patient to MAU for repeat BHCG. Patient denies any pain or bleeding.  

## 2012-08-06 ENCOUNTER — Ambulatory Visit (HOSPITAL_COMMUNITY)
Admission: RE | Admit: 2012-08-06 | Discharge: 2012-08-06 | Disposition: A | Discharge status: Home / Self Care | Payer: Medicaid Other | Source: Ambulatory Visit | Attending: Advanced Practice Midwife | Admitting: Advanced Practice Midwife

## 2012-08-06 ENCOUNTER — Inpatient Hospital Stay (HOSPITAL_COMMUNITY)
Admission: AD | Admit: 2012-08-06 | Discharge: 2012-08-06 | Disposition: A | Discharge status: Home / Self Care | Payer: Medicaid Other | Source: Ambulatory Visit | Attending: Obstetrics and Gynecology | Admitting: Obstetrics and Gynecology

## 2012-08-06 ENCOUNTER — Other Ambulatory Visit (HOSPITAL_COMMUNITY): Payer: Self-pay | Admitting: Advanced Practice Midwife

## 2012-08-06 DIAGNOSIS — O209 Hemorrhage in early pregnancy, unspecified: Secondary | ICD-10-CM | POA: Insufficient documentation

## 2012-08-06 DIAGNOSIS — O200 Threatened abortion: Secondary | ICD-10-CM

## 2012-08-06 DIAGNOSIS — Z09 Encounter for follow-up examination after completed treatment for conditions other than malignant neoplasm: Secondary | ICD-10-CM

## 2012-08-06 DIAGNOSIS — O418X11 Other specified disorders of amniotic fluid and membranes, first trimester, fetus 1: Secondary | ICD-10-CM

## 2012-08-06 DIAGNOSIS — Z3689 Encounter for other specified antenatal screening: Secondary | ICD-10-CM | POA: Insufficient documentation

## 2012-08-06 NOTE — MAU Note (Signed)
Pt here for F/U U/S, pt denies pain or bleeding.

## 2012-08-06 NOTE — MAU Provider Note (Signed)
  History     CSN: [id number]  Arrival date and time: 08/06/12 1552   None     Chief Complaint  Patient presents with  . Follow-up   HPI Faith [name] is 22 y.o. [id number] [redacted]w[redacted]d weeks presenting with follow up ultrasound.  She was seen 3/23-bhcg was 86 and then on 3/26 286.  She denies pain or bleeding today.      Past Medical History  Diagnosis Date  . Hypertension   . Urinary tract infection   . Chlamydia     Past Surgical History  Procedure Laterality Date  . Cesarean section      Family History  Problem Relation Age of Onset  . Anesthesia problems Neg Hx     History  Substance Use Topics  . Smoking status: Current Some Day Smoker -- 0.25 packs/day  . Smokeless tobacco: Never Used  . Alcohol Use: Yes     Comment: Every other weekend- wine    Allergies: No Known Allergies  Prescriptions prior to admission  Medication Sig Dispense Refill  . metroNIDAZOLE (FLAGYL) 500 MG tablet Take 1 tablet (500 mg total) by mouth 2 (two) times daily.  14 tablet  0  . promethazine (PHENERGAN) 25 MG tablet Take 0.5 tablets (12.5 mg total) by mouth every 6 (six) hours as needed for nausea.  20 tablet  0    Review of Systems  Gastrointestinal: Negative for abdominal pain.  Genitourinary:       Neg for vaginal bleeding   Physical Exam   Blood pressure 107/51, pulse 90, temperature 98.4 F (36.9 C), temperature source Oral, resp. rate 16, last menstrual period 06/25/2012.  Physical Exam  Constitutional: She is oriented to person, place, and time. She appears well-developed and well-nourished. No distress.  HENT:  Head: Normocephalic.  Genitourinary:  Not indicated  Neurological: She is alert and oriented to person, place, and time.  Skin: Skin is warm and dry.  Psychiatric: She has a normal mood and affect. Her behavior is normal.    MAU Course  Procedures  MDM  Discussed MSE and U/S results with Dr. Jolayne Panther.  Order given to discuss miscarriage possibility  with U/S findings. Assessment and Plan  A;  Moderate Subchorionic hemorrhage at [redacted]w[redacted]d gestation      No fetal pole or yolk sac yet seen in small IUGS  P:  F/U ultrasound to prove IUP in 10 days     Discussed threatened miscarriage with [facility]  KEY,EVE M 08/06/2012, 4:43 PM

## 2012-08-06 NOTE — MAU Provider Note (Signed)
Attestation of Attending Supervision of Advanced Practitioner (CNM/NP): Evaluation and management procedures were performed by the Advanced Practitioner under my supervision and collaboration.  I have reviewed the Advanced Practitioner's note and chart, and I agree with the management and plan.  Zuri Lascala 08/06/2012 7:51 PM

## 2012-08-18 ENCOUNTER — Inpatient Hospital Stay (HOSPITAL_COMMUNITY)
Admission: AD | Admit: 2012-08-18 | Discharge: 2012-08-18 | Disposition: A | Discharge status: Home / Self Care | Payer: Medicaid Other | Source: Ambulatory Visit | Attending: Obstetrics and Gynecology | Admitting: Obstetrics and Gynecology

## 2012-08-18 ENCOUNTER — Encounter (HOSPITAL_COMMUNITY): Payer: Self-pay

## 2012-08-18 ENCOUNTER — Ambulatory Visit (HOSPITAL_COMMUNITY)
Admission: RE | Admit: 2012-08-18 | Discharge: 2012-08-18 | Disposition: A | Discharge status: Home / Self Care | Payer: Medicaid Other | Source: Ambulatory Visit | Attending: Gynecology | Admitting: Gynecology

## 2012-08-18 ENCOUNTER — Ambulatory Visit (HOSPITAL_COMMUNITY): Payer: Self-pay

## 2012-08-18 DIAGNOSIS — O3680X0 Pregnancy with inconclusive fetal viability, not applicable or unspecified: Secondary | ICD-10-CM | POA: Insufficient documentation

## 2012-08-18 DIAGNOSIS — Z3689 Encounter for other specified antenatal screening: Secondary | ICD-10-CM | POA: Insufficient documentation

## 2012-08-18 DIAGNOSIS — O365990 Maternal care for other known or suspected poor fetal growth, unspecified trimester, not applicable or unspecified: Secondary | ICD-10-CM | POA: Insufficient documentation

## 2012-08-18 DIAGNOSIS — O039 Complete or unspecified spontaneous abortion without complication: Secondary | ICD-10-CM

## 2012-08-18 MED ORDER — OXYCODONE-ACETAMINOPHEN 5-325 MG PO TABS: 2.0000 | ORAL_TABLET | ORAL | Status: DC | PRN

## 2012-08-18 MED ORDER — IBUPROFEN 600 MG PO TABS: 600.0000 mg | ORAL_TABLET | Freq: Four times a day (QID) | ORAL | Status: DC | PRN

## 2012-08-18 NOTE — MAU Provider Note (Signed)
Ms. RAELYNN CORRON is a 22 y.o. G3P2002 at [redacted]w[redacted]d who presents to MAU today for follow-up US for viability. [place] on 08/06/12 showed poor prognostic factors for possible failed pregnancy. The patient denies vaginal bleeding or abdominal pain today. She denies fever. She has some nausea without vomiting that is consistent since last visit and is well-controlled with phenergan.   BP 126/71  Pulse 87  Resp 20  LMP 06/25/2012 GENERAL: Well-developed, well-nourished female in no acute distress. Patient is visibly upset with the news.  HEENT: Normocephalic, atraumatic.   LUNGS: Effort normal HEART: Regular rate  SKIN: Warm, dry and without erythema PSYCH: Normal mood and affect    MDM Discussed with Dr. Jolayne Panther. Follow-up in clinic in 2 weeks  A: Failed pregnancy  P: Discharge home Rx for ibuprofen and percocet given to patient PRN pain, Rx phenergan previously prescribed Patient will follow-up in [facility] clinic in 2 weeks Bleeding precautions discussed Patient may return to MAU as needed or if her condition were to change or worsen  Freddi Starr, PA-C 08/18/2012 2:12 PM

## 2012-08-18 NOTE — MAU Note (Signed)
Patient to MAU after ultrasound. Patient denies pain or bleeding.  

## 2012-08-19 NOTE — MAU Provider Note (Signed)
Attestation of Attending Supervision of Advanced Practitioner (CNM/NP): Evaluation and management procedures were performed by the Advanced Practitioner under my supervision and collaboration.  I have reviewed the Advanced Practitioner's note and chart, and I agree with the management and plan.  Brendan Gadson 08/19/2012 12:55 PM

## 2012-09-01 ENCOUNTER — Ambulatory Visit (INDEPENDENT_AMBULATORY_CARE_PROVIDER_SITE_OTHER): Payer: Self-pay | Admitting: Obstetrics and Gynecology

## 2012-09-01 ENCOUNTER — Encounter: Payer: Self-pay | Admitting: Obstetrics and Gynecology

## 2012-09-01 VITALS — BP 131/82 | HR 88 | Temp 97.3°F | Ht 61.0 in | Wt 236.5 lb

## 2012-09-01 DIAGNOSIS — O021 Missed abortion: Secondary | ICD-10-CM

## 2012-09-01 MED ORDER — MISOPROSTOL 200 MCG PO TABS: ORAL_TABLET | ORAL | Status: DC

## 2012-09-01 NOTE — Progress Notes (Signed)
Patient began spotting on 08/21/12.  She reports passing one clot the size of a quarter.  She is currently bleeding and is not in any pain.

## 2012-09-01 NOTE — Progress Notes (Signed)
  Subjective:    Patient ID: RAFAEL QUESADA, female    DOB: 1990-07-01, 22 y.o.   MRN: [id number]  HPI 22 yo [id number] presenting today as MAU follow up on missed abortion. Patient was diagnosed with a failed pregnancy on 4/16 and chose expectant management. Patient states that she started to bleed on 4/19 until present. Her bleeding was initially heavy but seems to have slowed down. Patient is otherwise doing well. This was not a planned pregnancy and patient is not interested in birth control at this time.  Past Medical History  Diagnosis Date  . Hypertension   . Urinary tract infection   . Chlamydia    Past Surgical History  Procedure Laterality Date  . Cesarean section     Family History  Problem Relation Age of Onset  . Anesthesia problems Neg Hx   . Diabetes Sister   . Hypertension Paternal Grandmother    History  Substance Use Topics  . Smoking status: Current Some Day Smoker -- 0.25 packs/day  . Smokeless tobacco: Never Used  . Alcohol Use: No     Comment: Every other weekend- wine      Review of Systems  All other systems reviewed and are negative.       Objective:   Physical Exam  Declined      Assessment & Plan:  22 yo with missed abortion - Will check quant HCG today - Pelvic ultrasound ordered - Patient provided with a prescription of 1000 mcg cytotec. Patient was instructed to await results of ultrasound and will be contacted as to whether or not to take cytotec if there is retained POC. with missed abortion - Will check quant HCG today - Pelvic ultrasound ordered - Patient provided with a prescription of 1000 mcg cytotec. Patient was instructed to await results of ultrasound and will be contacted as to whether or not to take cytotec if there is retained POC.

## 2012-09-02 ENCOUNTER — Other Ambulatory Visit: Payer: Self-pay | Admitting: Obstetrics and Gynecology

## 2012-09-02 ENCOUNTER — Ambulatory Visit (HOSPITAL_COMMUNITY)
Admission: RE | Admit: 2012-09-02 | Discharge: 2012-09-02 | Disposition: A | Discharge status: Home / Self Care | Payer: Medicaid Other | Source: Ambulatory Visit | Attending: Obstetrics and Gynecology | Admitting: Obstetrics and Gynecology

## 2012-09-02 DIAGNOSIS — O021 Missed abortion: Secondary | ICD-10-CM

## 2012-09-02 DIAGNOSIS — O209 Hemorrhage in early pregnancy, unspecified: Secondary | ICD-10-CM | POA: Insufficient documentation

## 2012-09-02 LAB — HCG, QUANTITATIVE, PREGNANCY: hCG, Beta Chain, Quant, S: 164 m[IU]/mL

## 2012-09-02 MED ORDER — MISOPROSTOL 200 MCG PO TABS: ORAL_TABLET | ORAL | Status: DC

## 2012-09-02 NOTE — Progress Notes (Addendum)
Patient ID: Faith [name], female   DOB: 1990-07-21, 22 y.o.   MRN: [id number] Patient seen in ultrasound suite today secondary to ultrasound demonstrating an incomplete abortion. Patient was informed of the meaning of this findings. Patient agreed to medical management with cytotec, as previously discussed. An appointment will be scheduled for her to follow-up in the office in 1 week. All questions were answered.

## 2012-09-02 NOTE — Addendum Note (Signed)
Addended by: Catalina Antigua on: 09/02/2012 03:01 PM   Modules accepted: Orders

## 2012-09-09 ENCOUNTER — Ambulatory Visit (INDEPENDENT_AMBULATORY_CARE_PROVIDER_SITE_OTHER): Payer: Self-pay | Admitting: Obstetrics & Gynecology

## 2012-09-09 ENCOUNTER — Encounter: Payer: Self-pay | Admitting: Obstetrics & Gynecology

## 2012-09-09 VITALS — BP 135/82 | HR 86 | Temp 98.0°F | Ht 61.0 in | Wt 234.4 lb

## 2012-09-09 DIAGNOSIS — O039 Complete or unspecified spontaneous abortion without complication: Secondary | ICD-10-CM

## 2012-09-09 MED ORDER — PROMETHAZINE HCL 25 MG PO TABS: 12.5000 mg | ORAL_TABLET | Freq: Four times a day (QID) | ORAL | Status: DC | PRN

## 2012-09-09 NOTE — Patient Instructions (Signed)
Return to clinic for any scheduled appointments or for any gynecologic concerns as needed.   

## 2012-09-10 NOTE — Progress Notes (Signed)
GYNECOLOGY CLINIC PROGRESS NOTE  History:  22 y.o. [id number] here today for followup after treatment of SAB with cytotec.  Ultrasound showed IUGS in lower uterine segment and cervical canal, and patient was given cytotec to complete the process. She reported having a little bleeding but did not pass any tissue.  She is concerned it is still there.  No current bleeding. Reports having nausea and wants refill of Phenergan.  The following portions of the patient's history were reviewed and updated as appropriate: allergies, current medications, past family history, past medical history, past social history, past surgical history and problem list.  Review of Systems:  Pertinent items are noted in HPI.  Objective:  Physical Exam BP 135/82  Pulse 86  Temp(Src) 98 F (36.7 C)  Ht 5\' 1"  (1.549 m)  Wt 234 lb 6.4 oz (106.323 kg)  BMI 44.31 kg/m2  LMP 06/25/2012  Breastfeeding? No Gen: NAD Abd: Soft, nontender and nondistended Pelvic: Deferred  Assessment & Plan:  SAB; will obtain ultrasound to verify completion.  If still present, offer 2nd dose of cytotec vs D&E.  Phenergan refilled.

## 2012-09-15 ENCOUNTER — Ambulatory Visit (HOSPITAL_COMMUNITY)
Admission: RE | Admit: 2012-09-15 | Discharge: 2012-09-15 | Disposition: A | Discharge status: Home / Self Care | Payer: Medicaid Other | Source: Ambulatory Visit | Attending: Obstetrics & Gynecology | Admitting: Obstetrics & Gynecology

## 2012-09-15 ENCOUNTER — Other Ambulatory Visit: Payer: Self-pay | Admitting: Obstetrics & Gynecology

## 2012-09-15 DIAGNOSIS — O039 Complete or unspecified spontaneous abortion without complication: Secondary | ICD-10-CM

## 2012-09-15 DIAGNOSIS — O021 Missed abortion: Secondary | ICD-10-CM | POA: Insufficient documentation

## 2012-09-16 ENCOUNTER — Telehealth: Payer: Self-pay

## 2012-09-16 NOTE — Telephone Encounter (Signed)
Called pt and informed pt of her results of [place].  Pt decided that she wanted to have a D&E.  Pt also has appt scheduled already on 09/30/12 @ 1500 for results with [facility] where she can discuss surgical consult.  I advised pt that I would send a message to the provider and if the provider decides to do anything different from her appt on 09/30/12 then we would give her a call.  Pt stated understanding and did not have any other questions.

## 2012-09-16 NOTE — Telephone Encounter (Signed)
Message copied by Faythe Casa on Thu Sep 16, 2012  3:04 PM ------      Message from: Jaynie Collins A      Created: Wed Sep 15, 2012  5:12 PM       Ultrasound shows that the gestational sac is still present in the lower uterine segment.  Please call patient to offer her another dose of misoprostol vs D&E. ------

## 2012-09-29 ENCOUNTER — Ambulatory Visit: Payer: Self-pay | Admitting: Obstetrics and Gynecology

## 2012-09-30 ENCOUNTER — Ambulatory Visit: Payer: Self-pay | Admitting: Obstetrics & Gynecology

## 2012-10-04 ENCOUNTER — Encounter (HOSPITAL_COMMUNITY): Payer: Self-pay | Admitting: *Deleted

## 2012-10-04 ENCOUNTER — Ambulatory Visit (HOSPITAL_COMMUNITY): Payer: Medicaid Other | Admitting: Anesthesiology

## 2012-10-04 ENCOUNTER — Encounter (HOSPITAL_COMMUNITY)
Admission: AD | Disposition: A | Discharge status: Home / Self Care | Payer: Self-pay | Source: Ambulatory Visit | Attending: Obstetrics & Gynecology

## 2012-10-04 ENCOUNTER — Ambulatory Visit (HOSPITAL_COMMUNITY): Payer: Medicaid Other

## 2012-10-04 ENCOUNTER — Ambulatory Visit (HOSPITAL_COMMUNITY)
Admission: AD | Admit: 2012-10-04 | Discharge: 2012-10-04 | Disposition: A | Discharge status: Home / Self Care | Payer: Medicaid Other | Source: Ambulatory Visit | Attending: Obstetrics & Gynecology | Admitting: Obstetrics & Gynecology

## 2012-10-04 ENCOUNTER — Encounter (HOSPITAL_COMMUNITY): Payer: Self-pay | Admitting: Anesthesiology

## 2012-10-04 DIAGNOSIS — O034 Incomplete spontaneous abortion without complication: Secondary | ICD-10-CM | POA: Insufficient documentation

## 2012-10-04 DIAGNOSIS — Z538 Procedure and treatment not carried out for other reasons: Secondary | ICD-10-CM | POA: Insufficient documentation

## 2012-10-04 HISTORY — PX: DILATION AND EVACUATION: SHX1459

## 2012-10-04 LAB — PREGNANCY, URINE: Preg Test, Ur: NEGATIVE

## 2012-10-04 LAB — CBC
MCHC: 32.4 g/dL (ref 30.0–36.0)
MCV: 82.5 fL (ref 78.0–100.0)
Platelets: 284 10*3/uL (ref 150–400)
RDW: 13.6 % (ref 11.5–15.5)
WBC: 6.5 10*3/uL (ref 4.0–10.5)

## 2012-10-04 SURGERY — DILATION AND EVACUATION, UTERUS
Anesthesia: Monitor Anesthesia Care | Site: Vagina | Wound class: Clean Contaminated

## 2012-10-04 MED ORDER — MIDAZOLAM HCL 2 MG/2ML IJ SOLN
INTRAMUSCULAR | Status: AC
  Filled 2012-10-04: qty 2

## 2012-10-04 MED ORDER — ONDANSETRON HCL 4 MG/2ML IJ SOLN
INTRAMUSCULAR | Status: AC
  Filled 2012-10-04: qty 2

## 2012-10-04 MED ORDER — DOXYCYCLINE HYCLATE 100 MG IV SOLR
100.0000 mg | Freq: Once | INTRAVENOUS | Status: DC
  Filled 2012-10-04: qty 100

## 2012-10-04 MED ORDER — FENTANYL CITRATE 0.05 MG/ML IJ SOLN
INTRAMUSCULAR | Status: AC
  Filled 2012-10-04: qty 5

## 2012-10-04 MED ORDER — PROPOFOL 10 MG/ML IV EMUL
INTRAVENOUS | Status: AC
  Filled 2012-10-04: qty 20

## 2012-10-04 MED ORDER — DEXAMETHASONE SODIUM PHOSPHATE 10 MG/ML IJ SOLN
INTRAMUSCULAR | Status: AC
  Filled 2012-10-04: qty 1

## 2012-10-04 MED ORDER — LIDOCAINE HCL (CARDIAC) 20 MG/ML IV SOLN
INTRAVENOUS | Status: AC
  Filled 2012-10-04: qty 5

## 2012-10-04 MED ORDER — LACTATED RINGERS IV SOLN
INTRAVENOUS | Status: DC
  Administered 2012-10-04: 50 mL/h via INTRAVENOUS

## 2012-10-04 SURGICAL SUPPLY — 22 items
CATH ROBINSON RED A/P 16FR (CATHETERS) ×2 IMPLANT
CLOTH BEACON ORANGE TIMEOUT ST (SAFETY) ×2 IMPLANT
DECANTER SPIKE VIAL GLASS SM (MISCELLANEOUS) ×2 IMPLANT
GAUZE PACKING 1 X5 YD ST (GAUZE/BANDAGES/DRESSINGS) ×1 IMPLANT
GLOVE BIO SURGEON STRL SZ7 (GLOVE) ×2 IMPLANT
GLOVE BIOGEL PI IND STRL 7.0 (GLOVE) ×1 IMPLANT
GLOVE BIOGEL PI INDICATOR 7.0 (GLOVE) ×1
GOWN STRL REIN XL XLG (GOWN DISPOSABLE) ×4 IMPLANT
KIT BERKELEY 1ST TRIMESTER 3/8 (MISCELLANEOUS) ×2 IMPLANT
NDL SPNL 22GX3.5 QUINCKE BK (NEEDLE) ×1 IMPLANT
NEEDLE SPNL 22GX3.5 QUINCKE BK (NEEDLE) ×2 IMPLANT
NS IRRIG 1000ML POUR BTL (IV SOLUTION) ×2 IMPLANT
PACK VAGINAL MINOR WOMEN LF (CUSTOM PROCEDURE TRAY) ×2 IMPLANT
PAD OB MATERNITY 4.3X12.25 (PERSONAL CARE ITEMS) ×2 IMPLANT
PAD PREP 24X48 CUFFED NSTRL (MISCELLANEOUS) ×2 IMPLANT
SET BERKELEY SUCTION TUBING (SUCTIONS) ×2 IMPLANT
SYR CONTROL 10ML LL (SYRINGE) ×2 IMPLANT
TOWEL OR 17X24 6PK STRL BLUE (TOWEL DISPOSABLE) ×4 IMPLANT
VACURETTE 10 RIGID CVD (CANNULA) IMPLANT
VACURETTE 7MM CVD STRL WRAP (CANNULA) IMPLANT
VACURETTE 8 RIGID CVD (CANNULA) IMPLANT
VACURETTE 9 RIGID CVD (CANNULA) IMPLANT

## 2012-10-04 NOTE — H&P (Signed)
Faith [name] is an 22 y.o. female [phone] with missed abortion s/p cytotec.  Pt did not pass products based on f/u US.  Pt bled heavily last Saturday (this after the last [place]).  Pt is not bleeding now.  UPT is negative today.  US shows that the gestational has passed.  Lining is 14 mm.  No evidence of retained POC.    Pertinent Gynecological History:   Menstrual History:  Patient's last menstrual period was 06/25/2012.    Past Medical History  Diagnosis Date  . Hypertension   . Urinary tract infection   . Chlamydia     Past Surgical History  Procedure Laterality Date  . Cesarean section      Family History  Problem Relation Age of Onset  . Anesthesia problems Neg Hx   . Diabetes Sister   . Hypertension Paternal Grandmother     Social History:  reports that she has been smoking.  She has never used smokeless tobacco. She reports that she does not drink alcohol or use illicit drugs.  Allergies: No Known Allergies  Prescriptions prior to admission  Medication Sig Dispense Refill  . ibuprofen (ADVIL,MOTRIN) 600 MG tablet Take 1 tablet (600 mg total) by mouth every 6 (six) hours as needed for pain.  30 tablet  0    Review of Systems  Constitutional: Negative.   Respiratory: Negative.   Cardiovascular: Negative.   Gastrointestinal: Negative.   Genitourinary: Negative.     Blood pressure 131/79, pulse 79, temperature 97.9 F (36.6 C), temperature source Oral, resp. rate 18, height 5\' 1"  (1.549 m), weight 236 lb (107.049 kg), last menstrual period 06/25/2012, SpO2 100.00%. Physical Exam  Vitals reviewed. Constitutional: She appears well-developed. No distress.  HENT:  Head: Normocephalic and atraumatic.  Neck: No thyromegaly present.  Cardiovascular: Normal rate and regular rhythm.   Respiratory: Breath sounds normal.  GI: Soft. She exhibits no distension. There is no tenderness. There is no rebound.  Skin: Skin is warm and dry.  Psychiatric: She has a normal  mood and affect.    Results for orders placed during the hospital encounter of 10/04/12 (from the past 24 hour(s))  CBC     Status: Abnormal   Collection Time    10/04/12  9:40 AM      Result Value Range   WBC 6.5  4.0 - 10.5 K/uL   RBC 4.45  3.87 - 5.11 MIL/uL   Hemoglobin 11.9 (*) 12.0 - 15.0 g/dL   HCT 45.4  09.8 - 11.9 %   MCV 82.5  78.0 - 100.0 fL   MCH 26.7  26.0 - 34.0 pg   MCHC 32.4  30.0 - 36.0 g/dL   RDW 14.7  82.9 - 56.2 %   Platelets 284  150 - 400 K/uL    No results found.  Assessment/Plan: 22 yo female [id number] with complete abortion. UPT negative.  US shows trhat gestational sac is passed with no retained POC Pt wants to be pregnant again.  Advised to continue prenatal vitamins and to have one normal menses before trying to conceive.  Pt has f/u appt June 23rd in gyn clinic.  Josehua Hammar H. 10/04/2012, 11:23 AM

## 2012-10-04 NOTE — Anesthesia Preprocedure Evaluation (Addendum)
Anesthesia Evaluation  Patient identified by MRN, date of birth, ID band Patient awake    Reviewed: Allergy & Precautions, H&P , NPO status , Patient's Chart, lab work & pertinent test results, reviewed documented beta blocker date and time   History of Anesthesia Complications Negative for: history of anesthetic complications  Airway Mallampati: I TM Distance: >3 FB Neck ROM: full    Dental  (+) Teeth Intact   Pulmonary Current Smoker,  breath sounds clear to auscultation        Cardiovascular Exercise Tolerance: Good hypertension (h/o, never on meds), Rhythm:regular Rate:Normal     Neuro/Psych negative neurological ROS  negative psych ROS   GI/Hepatic negative GI ROS, Neg liver ROS,   Endo/Other  Morbid obesity  Renal/GU negative Renal ROS  negative genitourinary   Musculoskeletal   Abdominal   Peds  Hematology negative hematology ROS (+)   Anesthesia Other Findings   Reproductive/Obstetrics (+) Pregnancy (incomplete ab at 5 weeks, failed cytotec)                         Anesthesia Physical Anesthesia Plan  ASA: III  Anesthesia Plan: MAC   Post-op Pain Management:    Induction:   Airway Management Planned:   Additional Equipment:   Intra-op Plan:   Post-operative Plan:   Informed Consent: I have reviewed the patients History and Physical, chart, labs and discussed the procedure including the risks, benefits and alternatives for the proposed anesthesia with the patient or authorized representative who has indicated his/her understanding and acceptance.     Plan Discussed with: Surgeon and CRNA  Anesthesia Plan Comments:         Anesthesia Quick Evaluation

## 2012-10-04 NOTE — Preoperative (Signed)
Beta Blockers   Reason not to administer Beta Blockers:Not Applicable 

## 2012-10-04 NOTE — OR Nursing (Signed)
Pt case cancelled per surgeon she no longer needs surgery

## 2012-10-05 ENCOUNTER — Encounter (HOSPITAL_COMMUNITY): Payer: Self-pay | Admitting: Obstetrics & Gynecology

## 2012-10-14 ENCOUNTER — Ambulatory Visit: Payer: Self-pay | Admitting: Obstetrics & Gynecology

## 2012-10-25 ENCOUNTER — Ambulatory Visit: Payer: Self-pay | Admitting: Family Medicine

## 2012-10-25 ENCOUNTER — Ambulatory Visit: Payer: Self-pay | Admitting: Obstetrics & Gynecology

## 2013-01-04 ENCOUNTER — Encounter (HOSPITAL_COMMUNITY): Payer: Self-pay

## 2013-01-04 ENCOUNTER — Inpatient Hospital Stay (HOSPITAL_COMMUNITY)
Admission: AD | Admit: 2013-01-04 | Discharge: 2013-01-04 | Disposition: A | Discharge status: Home / Self Care | Payer: Medicaid Other | Source: Ambulatory Visit | Attending: Obstetrics & Gynecology | Admitting: Obstetrics & Gynecology

## 2013-01-04 ENCOUNTER — Inpatient Hospital Stay (HOSPITAL_COMMUNITY): Payer: Medicaid Other

## 2013-01-04 DIAGNOSIS — O26899 Other specified pregnancy related conditions, unspecified trimester: Secondary | ICD-10-CM

## 2013-01-04 DIAGNOSIS — O210 Mild hyperemesis gravidarum: Secondary | ICD-10-CM | POA: Insufficient documentation

## 2013-01-04 DIAGNOSIS — O219 Vomiting of pregnancy, unspecified: Secondary | ICD-10-CM

## 2013-01-04 DIAGNOSIS — R109 Unspecified abdominal pain: Secondary | ICD-10-CM | POA: Insufficient documentation

## 2013-01-04 LAB — URINALYSIS, ROUTINE W REFLEX MICROSCOPIC
Ketones, ur: NEGATIVE mg/dL
Protein, ur: NEGATIVE mg/dL
Urobilinogen, UA: 0.2 mg/dL (ref 0.0–1.0)

## 2013-01-04 LAB — POCT PREGNANCY, URINE: Preg Test, Ur: POSITIVE — AB

## 2013-01-04 MED ORDER — PROMETHAZINE HCL 12.5 MG PO TABS: 12.5000 mg | ORAL_TABLET | Freq: Four times a day (QID) | ORAL | Status: DC | PRN

## 2013-01-04 MED ORDER — PRENATAL PLUS 27-1 MG PO TABS: 1.0000 | ORAL_TABLET | Freq: Every day | ORAL | Status: DC

## 2013-01-04 NOTE — MAU Provider Note (Signed)
Attestation of Attending Supervision of Advanced Practitioner (CNM/NP): Evaluation and management procedures were performed by the Advanced Practitioner under my supervision and collaboration.  I have reviewed the Advanced Practitioner's note and chart, and I agree with the management and plan.  HARRAWAY-SMITH, Karita Dralle 8:23 PM

## 2013-01-04 NOTE — MAU Note (Signed)
Patient is in with c/o lower abdominal cramping and unsure lmp. She thinks last menstrual was in June. Denies vaginal bleeding.

## 2013-01-04 NOTE — MAU Provider Note (Signed)
Chief Complaint:  Abdominal Cramping   First Provider Initiated Contact with Patient 01/04/13 1817      HPI: Faith [name] is a 22 y.o. [id number] at [redacted]w[redacted]d by unsure LMP who presents to maternity admissions reporting intermittent lower abdominal cramping, nausea for a month and positive HPT. Denies pain at present and no vaginal bleeding. No vomiting today.    Denies dysuria, urgency or frequency of urination, hematuria. No irritative vaginal discharge and declines a pelvic exam.   Past Medical History: Past Medical History  Diagnosis Date  . Hypertension   . Urinary tract infection   . Chlamydia     Past obstetric history: OB History  Gravida Para Term Preterm AB SAB TAB Ectopic Multiple Living  5 2 2  0 2 2 0 0 0 2    # Outcome Date GA Lbr Len/2nd Weight Sex Delivery Anes PTL Lv  5 CUR           4 TRM 01/21/08    F SVD   Y  3 TRM 12/21/06    F LTCS   Y  2 SAB           1 SAB              Comments: System Generated. Please review and update pregnancy details.      Past Surgical History: Past Surgical History  Procedure Laterality Date  . Cesarean section    . Dilation and evacuation N/A 10/04/2012    Procedure: DILATATION AND EVACUATION;  Surgeon: Lesly Dukes, MD;  Location: [facility];  Service: Gynecology;  Laterality: N/A;     Family History: Family History  Problem Relation Age of Onset  . Anesthesia problems Neg Hx   . Diabetes Sister   . Hypertension Paternal Grandmother     Social History: History  Substance Use Topics  . Smoking status: Current Some Day Smoker -- 0.25 packs/day  . Smokeless tobacco: Never Used  . Alcohol Use: No     Comment: Every other weekend- wine    Allergies: No Known Allergies  Meds:  No prescriptions prior to admission    ROS: Pertinent findings in history of present illness. Mild constipation.  Physical Exam  Last menstrual period 10/04/2012. There were no vitals filed for this visit. GENERAL: Well-developed,  well-nourished female in no acute distress.  HEENT: normocephalic HEART: normal rate RESP: normal effort ABDOMEN: Soft, non-tender, obese EXTREMITIES: Nontender, no edema NEURO: alert and oriented    FHT: not heard with Doppler  Labs: Results for orders placed during the hospital encounter of 01/04/13 (from the past 24 hour(s))  URINALYSIS, ROUTINE W REFLEX MICROSCOPIC     Status: Abnormal   Collection Time    01/04/13  4:35 PM      Result Value Range   Color, Urine YELLOW  YELLOW   APPearance HAZY (*) CLEAR   Specific Gravity, Urine >1.030 (*) 1.005 - 1.030   pH 6.0  5.0 - 8.0   Glucose, UA NEGATIVE  NEGATIVE mg/dL   Hgb urine dipstick NEGATIVE  NEGATIVE   Bilirubin Urine NEGATIVE  NEGATIVE   Ketones, ur NEGATIVE  NEGATIVE mg/dL   Protein, ur NEGATIVE  NEGATIVE mg/dL   Urobilinogen, UA 0.2  0.0 - 1.0 mg/dL   Nitrite NEGATIVE  NEGATIVE   Leukocytes, UA SMALL (*) NEGATIVE  POCT PREGNANCY, URINE     Status: Abnormal   Collection Time    01/04/13  5:21 PM      Result Value  Range   Preg Test, Ur POSITIVE (*) NEGATIVE    Imaging:  Bedside US by me> early viable IUP  No results found. US Ob Comp Less 14 Wks  01/04/2013   *RADIOLOGY REPORT*  Clinical Data: Cramping.  Estimated gestational age by LMP is 13 weeks 1 day, but LMP is reportedly uncertain.  OBSTETRIC <14 WK ULTRASOUND  Technique:  Transabdominal ultrasound was performed for evaluation of the gestation as well as the maternal uterus and adnexal regions.  Comparison:  Pelvic ultrasound 10/04/2012  Intrauterine gestational sac: Visualized Yolk sac: Visualized Embryo: Visualized Cardiac Activity: Visualized Heart Rate: 165 bpm  CRL:  1.98 mm  8 w  4 d       [place] EDC: 08/12/2013  Maternal uterus/Adnexae: The left and right maternal ovaries have normal appearances.  No adnexal mass is identified.  Negative for subchorionic hemorrhage. No free pelvic fluid is identified.  IMPRESSION: Single living intrauterine pregnancy.  Estimated  gestational age by crown-rump length is 8 weeks 4 days with an EDC of 08/12/2013.  Ultrasound for fetal anatomic evaluation at 18-[redacted] weeks gestational age is recommended.   Original Report Authenticated By: Britta Mccreedy, M.D.      Assessment: 1. Abdominal pain in pregnancy   2. Nausea and vomiting in pregnancy prior to [redacted] weeks gestation   G5P2022 at [redacted]w[redacted]d viable IUP  Plan: Pregnancy verification and eligibility information given Discharge home     Medication List         prenatal vitamin w/FE, FA 27-1 MG Tabs tablet  Take 1 tablet by mouth daily.     promethazine 12.5 MG tablet  Commonly known as:  PHENERGAN  Take 1 tablet (12.5 mg total) by mouth every 6 (six) hours as needed for nausea.       Follow-up Information   Schedule an appointment as soon as possible for a visit with Kathreen Cosier, MD.   Specialty:  Obstetrics and Gynecology   Contact information:   [address] [place] [address] [phone]       Faith [name], CNM 01/04/2013 6:18 PM

## 2013-01-06 ENCOUNTER — Other Ambulatory Visit: Payer: Self-pay | Admitting: Obstetrics & Gynecology

## 2013-01-06 DIAGNOSIS — O2341 Unspecified infection of urinary tract in pregnancy, first trimester: Secondary | ICD-10-CM

## 2013-01-06 LAB — URINE CULTURE: Colony Count: >=100000

## 2013-01-06 MED ORDER — CEPHALEXIN 500 MG PO CAPS: 500.0000 mg | ORAL_CAPSULE | Freq: Four times a day (QID) | ORAL | Status: DC

## 2013-01-06 NOTE — Progress Notes (Signed)
Called [facility] home number and heard message at subscriber's request is not accepting calls. Called work number and left a message with a female for Jaleea to call clinic.

## 2013-01-07 NOTE — Progress Notes (Signed)
Called patient, no answer- left message to call us back at the clinics 

## 2013-01-10 ENCOUNTER — Encounter: Payer: Self-pay | Admitting: Obstetrics and Gynecology

## 2013-01-10 ENCOUNTER — Encounter: Payer: Self-pay | Admitting: General Practice

## 2013-01-10 NOTE — Progress Notes (Signed)
Called patient, no answer- left message to call us back at the clinics. Will send patient letter as this is the third attempt

## 2013-01-13 ENCOUNTER — Encounter: Payer: Self-pay | Admitting: *Deleted

## 2013-01-17 LAB — OB RESULTS CONSOLE RPR: RPR: NONREACTIVE

## 2013-01-17 LAB — OB RESULTS CONSOLE HIV ANTIBODY (ROUTINE TESTING): HIV: NONREACTIVE

## 2013-01-31 ENCOUNTER — Encounter (HOSPITAL_COMMUNITY): Payer: Self-pay | Admitting: General Practice

## 2013-01-31 ENCOUNTER — Inpatient Hospital Stay (HOSPITAL_COMMUNITY)
Admission: AD | Admit: 2013-01-31 | Discharge: 2013-01-31 | Disposition: A | Discharge status: Home / Self Care | Payer: Medicaid Other | Source: Ambulatory Visit | Attending: Obstetrics | Admitting: Obstetrics

## 2013-01-31 DIAGNOSIS — O98819 Other maternal infectious and parasitic diseases complicating pregnancy, unspecified trimester: Secondary | ICD-10-CM | POA: Insufficient documentation

## 2013-01-31 DIAGNOSIS — A5901 Trichomonal vulvovaginitis: Secondary | ICD-10-CM | POA: Insufficient documentation

## 2013-01-31 DIAGNOSIS — A599 Trichomoniasis, unspecified: Secondary | ICD-10-CM

## 2013-01-31 DIAGNOSIS — O209 Hemorrhage in early pregnancy, unspecified: Secondary | ICD-10-CM | POA: Insufficient documentation

## 2013-01-31 DIAGNOSIS — R109 Unspecified abdominal pain: Secondary | ICD-10-CM | POA: Insufficient documentation

## 2013-01-31 DIAGNOSIS — K5900 Constipation, unspecified: Secondary | ICD-10-CM | POA: Insufficient documentation

## 2013-01-31 LAB — URINALYSIS, ROUTINE W REFLEX MICROSCOPIC
Nitrite: NEGATIVE
Protein, ur: NEGATIVE mg/dL
Urobilinogen, UA: 0.2 mg/dL (ref 0.0–1.0)

## 2013-01-31 LAB — WET PREP, GENITAL

## 2013-01-31 LAB — URINE MICROSCOPIC-ADD ON

## 2013-01-31 MED ORDER — METRONIDAZOLE 500 MG PO TABS
2000.0000 mg | ORAL_TABLET | Freq: Once | ORAL | Status: AC
  Administered 2013-01-31: 2000 mg via ORAL
  Filled 2013-01-31: qty 4

## 2013-01-31 NOTE — MAU Provider Note (Signed)
History     CSN: [id number]  Arrival date and time: 01/31/13 1746   First Provider Initiated Contact with Patient 01/31/13 1829      Chief Complaint  Patient presents with  . Vaginal Bleeding   HPI [name] is a 22 y.o. [id number] at [redacted]w[redacted]d who presents to MAU today with complaint of abdominal pain and vaginal bleeding. The patient states that she had one episode of diffuse abdominal pain that lasted ~ 15 minutes yesterday after eating. She had more episodes today while walking. She states that she had on episode of spotting around 5 pm today. She states that she noted bleeding after wiping, she felt that it was like a light period, but did not require a pad. She denies other vaginal discharge, fever, UTI symptoms or heartburn. She has not had recent intercourse or an exam. She was recently treated for Chlamydia and UTI by Dr. Gaynell Face. She has frequent constipation and nausea without vomiting or diarrhea.   OB History   Grav Para Term Preterm Abortions TAB SAB Ect Mult Living   5 2 2  0 2 0 2 0 0 2      Past Medical History  Diagnosis Date  . Hypertension   . Urinary tract infection   . Chlamydia     Past Surgical History  Procedure Laterality Date  . Cesarean section    . Dilation and evacuation N/A 10/04/2012    Procedure: DILATATION AND EVACUATION;  Surgeon: Lesly Dukes, MD;  Location: [facility];  Service: Gynecology;  Laterality: N/A;    Family History  Problem Relation Age of Onset  . Anesthesia problems Neg Hx   . Diabetes Sister   . Hypertension Paternal Grandmother     History  Substance Use Topics  . Smoking status: Former Smoker -- 0.25 packs/day    Quit date: 10/31/2012  . Smokeless tobacco: Never Used  . Alcohol Use: No     Comment: Every other weekend- wine    Allergies: No Known Allergies  Prescriptions prior to admission  Medication Sig Dispense Refill  . cephALEXin (KEFLEX) 500 MG capsule Take 1 capsule (500 mg total) by mouth 4  (four) times daily.  28 capsule  2  . prenatal vitamin w/FE, FA (PRENATAL 1 + 1) 27-1 MG TABS tablet Take 1 tablet by mouth daily.  30 each  0  . promethazine (PHENERGAN) 12.5 MG tablet Take 1 tablet (12.5 mg total) by mouth every 6 (six) hours as needed for nausea.  30 tablet  0    Review of Systems  Constitutional: Negative for fever and malaise/fatigue.  Gastrointestinal: Positive for nausea, abdominal pain and constipation. Negative for vomiting and diarrhea.  Genitourinary: Negative for dysuria, urgency and frequency.       + vaginal bleeding Neg - vaginal discharge   Physical Exam   Blood pressure 118/54, pulse 101, temperature 98 F (36.7 C), temperature source Oral, resp. rate 18, last menstrual period 10/04/2012.  Physical Exam  Constitutional: She is oriented to person, place, and time. She appears well-developed and well-nourished. No distress.  HENT:  Head: Normocephalic and atraumatic.  Cardiovascular: Normal rate, regular rhythm and normal heart sounds.   Respiratory: Effort normal and breath sounds normal. No respiratory distress.  GI: Soft. Bowel sounds are normal. She exhibits no distension and no mass. There is tenderness (very mild tenderness to palpation of the lower abdomen at midline). There is no rebound and no guarding.  Genitourinary: Uterus is enlarged (appropriate for  GA). Uterus is not tender. Cervix exhibits friability. Cervix exhibits no motion tenderness and no discharge. Right adnexum displays no mass and no tenderness. Left adnexum displays no mass and no tenderness. No bleeding around the vagina. Vaginal discharge (scant thin, white, mucus discharge noted) found.  Neurological: She is alert and oriented to person, place, and time.  Skin: Skin is warm and dry. No erythema.  Cervix: closed, long, thick and posterior  Results for orders placed during the hospital encounter of 01/31/13 (from the past 24 hour(s))  WET PREP, GENITAL     Status: Abnormal    Collection Time    01/31/13  6:43 PM      Result Value Range   Yeast Wet Prep HPF POC NONE SEEN  NONE SEEN   Trich, Wet Prep FEW (*) NONE SEEN   Clue Cells Wet Prep HPF POC NONE SEEN  NONE SEEN   WBC, Wet Prep HPF POC MANY (*) NONE SEEN  URINALYSIS, ROUTINE W REFLEX MICROSCOPIC     Status: Abnormal   Collection Time    01/31/13  6:44 PM      Result Value Range   Color, Urine YELLOW  YELLOW   APPearance CLEAR  CLEAR   Specific Gravity, Urine >1.030 (*) 1.005 - 1.030   pH 6.0  5.0 - 8.0   Glucose, UA NEGATIVE  NEGATIVE mg/dL   Hgb urine dipstick MODERATE (*) NEGATIVE   Bilirubin Urine NEGATIVE  NEGATIVE   Ketones, ur 40 (*) NEGATIVE mg/dL   Protein, ur NEGATIVE  NEGATIVE mg/dL   Urobilinogen, UA 0.2  0.0 - 1.0 mg/dL   Nitrite NEGATIVE  NEGATIVE   Leukocytes, UA TRACE (*) NEGATIVE  URINE MICROSCOPIC-ADD ON     Status: Abnormal   Collection Time    01/31/13  6:44 PM      Result Value Range   Squamous Epithelial / LPF FEW (*) RARE   WBC, UA 0-2  <3 WBC/hpf   RBC / HPF 7-10  <3 RBC/hpf   Bacteria, UA RARE  RARE   Urine-Other MUCOUS PRESENT       MAU Course  Procedures None  MDM +FHTs - 156 bpm UA, Wet prep, GC/Chlamydia today Patient treated for Trichomonas in MAU today with 2G Flagyl Assessment and Plan  A: Constipation Trichomonas  P: Discharge home Patient treated in MAU for trichomonas. Partner treatment advised GC/Chlamydia pending Patient advised to increase PO hydration as tolerated AVS contains information on fiber content in foods Patient advised to keep scheduled appointment for routine prenatal care with Dr. Gaynell Face Patient may return to MAU as needed or if her condition were to change or worsen  Freddi Starr, PA-C  01/31/2013, 7:50 PM

## 2013-01-31 NOTE — MAU Note (Signed)
Pt in for abdominal pain periodically since yesterday. Pt states the pain was sharp that lasted for 15-20 minutes. Pt also states some vaginal bleeding today like a light period.

## 2013-02-01 LAB — GC/CHLAMYDIA PROBE AMP
CT Probe RNA: NEGATIVE
GC Probe RNA: NEGATIVE

## 2013-02-24 ENCOUNTER — Encounter: Payer: Self-pay | Admitting: *Deleted

## 2013-03-06 ENCOUNTER — Inpatient Hospital Stay (HOSPITAL_COMMUNITY)
Admission: AD | Admit: 2013-03-06 | Discharge: 2013-03-06 | Disposition: A | Discharge status: Home / Self Care | Payer: Medicaid Other | Source: Ambulatory Visit | Attending: Obstetrics | Admitting: Obstetrics

## 2013-03-06 ENCOUNTER — Encounter (HOSPITAL_COMMUNITY): Payer: Self-pay | Admitting: *Deleted

## 2013-03-06 DIAGNOSIS — J3489 Other specified disorders of nose and nasal sinuses: Secondary | ICD-10-CM | POA: Insufficient documentation

## 2013-03-06 DIAGNOSIS — R509 Fever, unspecified: Secondary | ICD-10-CM | POA: Insufficient documentation

## 2013-03-06 DIAGNOSIS — R05 Cough: Secondary | ICD-10-CM | POA: Insufficient documentation

## 2013-03-06 DIAGNOSIS — R059 Cough, unspecified: Secondary | ICD-10-CM | POA: Insufficient documentation

## 2013-03-06 DIAGNOSIS — J069 Acute upper respiratory infection, unspecified: Secondary | ICD-10-CM

## 2013-03-06 MED ORDER — DM-GUAIFENESIN ER 30-600 MG PO TB12
1.0000 | ORAL_TABLET | Freq: Two times a day (BID) | ORAL | Status: DC
  Administered 2013-03-06: 1 via ORAL
  Filled 2013-03-06 (×2): qty 1

## 2013-03-06 MED ORDER — HYDROCODONE-ACETAMINOPHEN 5-325 MG PO TABS
2.0000 | ORAL_TABLET | Freq: Once | ORAL | Status: AC
  Administered 2013-03-06: 2 via ORAL
  Filled 2013-03-06: qty 2

## 2013-03-06 MED ORDER — DM-GUAIFENESIN ER 30-600 MG PO TB12: 1.0000 | ORAL_TABLET | Freq: Two times a day (BID) | ORAL | Status: DC

## 2013-03-06 NOTE — MAU Note (Signed)
Pt reports cough and congestion x 3 days, productive cough. Today states she had a temp of 99.2, and feels short of breath. Denies history of asthma. Nasal congestion.

## 2013-03-06 NOTE — MAU Provider Note (Signed)
History     CSN: [id number]  Arrival date and time: 03/06/13 2031   First Provider Initiated Contact with Patient 03/06/13 2055      Chief Complaint  Patient presents with  . Cough  . Nasal Congestion   HPI  Faith [name] is a 22 y.o. [id number] at [redacted]w[redacted]d who presents today with cough, congestion and low grade fever since Friday. She states that she took tylenol yesterday for the fever and aches. She has been using a vaporizer and Vicks. She did not get a flu shot.   Past Medical History  Diagnosis Date  . Hypertension   . Urinary tract infection   . Chlamydia     Past Surgical History  Procedure Laterality Date  . Cesarean section    . Dilation and evacuation N/A 10/04/2012    Procedure: DILATATION AND EVACUATION;  Surgeon: Lesly Dukes, MD;  Location: [facility];  Service: Gynecology;  Laterality: N/A;    Family History  Problem Relation Age of Onset  . Anesthesia problems Neg Hx   . Diabetes Sister   . Hypertension Paternal Grandmother     History  Substance Use Topics  . Smoking status: Former Smoker -- 0.25 packs/day    Quit date: 10/31/2012  . Smokeless tobacco: Never Used  . Alcohol Use: No     Comment: Every other weekend- wine    Allergies: No Known Allergies  Prescriptions prior to admission  Medication Sig Dispense Refill  . cephALEXin (KEFLEX) 500 MG capsule Take 1 capsule (500 mg total) by mouth 4 (four) times daily.  28 capsule  2  . prenatal vitamin w/FE, FA (PRENATAL 1 + 1) 27-1 MG TABS tablet Take 1 tablet by mouth daily.  30 each  0  . promethazine (PHENERGAN) 12.5 MG tablet Take 1 tablet (12.5 mg total) by mouth every 6 (six) hours as needed for nausea.  30 tablet  0    ROS Physical Exam   Blood pressure 115/93, pulse 102, temperature 98.2 F (36.8 C), resp. rate 22, height 5\' 1"  (1.549 m), weight 105.688 kg (233 lb), last menstrual period 10/04/2012, SpO2 100.00%.  Physical Exam  Nursing note and vitals reviewed. Constitutional:  She is oriented to person, place, and time. She appears well-developed and well-nourished. No distress.  Cardiovascular: Normal rate, regular rhythm and normal heart sounds.   Respiratory: Effort normal. No respiratory distress. She has no wheezes. She has no rales. She exhibits no tenderness.  GI: Soft. There is no tenderness.  Neurological: She is alert and oriented to person, place, and time.  Skin: Skin is warm and dry.  Psychiatric: She has a normal mood and affect.    MAU Course  Procedures  2150: Patient reports symptoms have improved with mucinex and vocodin.   Assessment and Plan   1. Viral URI with cough      Medication List         acetaminophen 325 MG tablet  Commonly known as:  TYLENOL  Take 650 mg by mouth every 6 (six) hours as needed for pain.     dextromethorphan-guaiFENesin 30-600 MG per 12 hr tablet  Commonly known as:  MUCINEX DM  Take 1 tablet by mouth 2 (two) times daily.     sodium chloride 0.65 % nasal spray  Commonly known as:  OCEAN  Place 1 spray into the nose as needed for congestion.     VICKS VAPORUB EX  Apply 1 application topically 2 (two) times daily as needed (congestion).  FU with Dr. Gaynell Face as needed Discussed comfort measures Return to MAU as needed   Tawnya Crook 03/06/2013, 8:57 PM

## 2013-05-05 NOTE — L&D Delivery Note (Signed)
Delivery Note At 7:25 PM [name] viable female was delivered via Vaginal, Spontaneous Delivery (Presentation: Right Occiput Anterior).  APGAR: 9, 9.  Placenta status: Intact, Spontaneous via [name].  Cord: 3 vessels with the following complications: None.    Anesthesia: Epidural  Episiotomy: None Lacerations: 1st degree;Periurethral, labial Suture Repair: 2.0 3.0 vicryl rapide Est. Blood Loss (mL): 300 ml  Mom to postpartum.  Baby to Couplet care / Skin to Skin.  Faith [name],Faith [name] 08/07/2013, 7:57 PM

## 2013-05-14 ENCOUNTER — Encounter (HOSPITAL_COMMUNITY): Payer: Self-pay | Admitting: *Deleted

## 2013-05-14 ENCOUNTER — Inpatient Hospital Stay (HOSPITAL_COMMUNITY)
Admission: AD | Admit: 2013-05-14 | Discharge: 2013-05-14 | Disposition: A | Discharge status: Home / Self Care | Payer: Medicaid Other | Source: Ambulatory Visit | Attending: Obstetrics | Admitting: Obstetrics

## 2013-05-14 DIAGNOSIS — Z87891 Personal history of nicotine dependence: Secondary | ICD-10-CM | POA: Insufficient documentation

## 2013-05-14 DIAGNOSIS — O26859 Spotting complicating pregnancy, unspecified trimester: Secondary | ICD-10-CM | POA: Insufficient documentation

## 2013-05-14 DIAGNOSIS — O98819 Other maternal infectious and parasitic diseases complicating pregnancy, unspecified trimester: Secondary | ICD-10-CM | POA: Insufficient documentation

## 2013-05-14 DIAGNOSIS — A5901 Trichomonal vulvovaginitis: Secondary | ICD-10-CM

## 2013-05-14 LAB — URINALYSIS, ROUTINE W REFLEX MICROSCOPIC
Bilirubin Urine: NEGATIVE
GLUCOSE, UA: NEGATIVE mg/dL
KETONES UR: NEGATIVE mg/dL
NITRITE: NEGATIVE
PROTEIN: NEGATIVE mg/dL
Specific Gravity, Urine: 1.02 (ref 1.005–1.030)
UROBILINOGEN UA: 0.2 mg/dL (ref 0.0–1.0)
pH: 6 (ref 5.0–8.0)

## 2013-05-14 LAB — URINE MICROSCOPIC-ADD ON

## 2013-05-14 LAB — WET PREP, GENITAL
Clue Cells Wet Prep HPF POC: NONE SEEN
Yeast Wet Prep HPF POC: NONE SEEN

## 2013-05-14 MED ORDER — METRONIDAZOLE 500 MG PO TABS: 500.0000 mg | ORAL_TABLET | Freq: Two times a day (BID) | ORAL | Status: DC

## 2013-05-14 NOTE — MAU Provider Note (Signed)
History     CSN: [id number]  Arrival date and time: 05/14/13 4098   First Provider Initiated Contact with Patient 05/14/13 1903      Chief Complaint  Patient presents with  . Vaginal pressure   . Vaginal Discharge   HPI Faith [name] 23 y.o. [redacted]w[redacted]d   Comes to MAU today with spotting since 2 pm, increased discharge, and pelvic pressure.  Is worried and came to be checked.  OB History   Grav Para Term Preterm Abortions TAB SAB Ect Mult Living   4 2 2  0 1 0 1 0 0 2      Past Medical History  Diagnosis Date  . Hypertension   . Urinary tract infection   . Chlamydia     Past Surgical History  Procedure Laterality Date  . Cesarean section    . Dilation and evacuation N/A 10/04/2012    Procedure: DILATATION AND EVACUATION;  Surgeon: Lesly Dukes, MD;  Location: [facility];  Service: Gynecology;  Laterality: N/A;    Family History  Problem Relation Age of Onset  . Anesthesia problems Neg Hx   . Diabetes Sister   . Hypertension Paternal Grandmother     History  Substance Use Topics  . Smoking status: Former Smoker -- 0.25 packs/day    Quit date: 10/31/2012  . Smokeless tobacco: Never Used  . Alcohol Use: No     Comment: Every other weekend- wine    Allergies: No Known Allergies  Prescriptions prior to admission  Medication Sig Dispense Refill  . acetaminophen (TYLENOL) 325 MG tablet Take 650 mg by mouth every 6 (six) hours as needed for pain.      . Camphor-Eucalyptus-Menthol (VICKS VAPORUB EX) Apply 1 application topically 2 (two) times daily as needed (congestion).      Marland Kitchen dextromethorphan-guaiFENesin (MUCINEX DM) 30-600 MG per 12 hr tablet Take 1 tablet by mouth 2 (two) times daily.  60 tablet  0  . sodium chloride (OCEAN) 0.65 % nasal spray Place 1 spray into the nose as needed for congestion.        Review of Systems  Constitutional: Negative for fever.  Gastrointestinal: Positive for abdominal pain. Negative for nausea, vomiting, diarrhea and  constipation.  Genitourinary: Negative for dysuria.       Watery discharge Vaginal spotting. Pelvic pressure   Physical Exam   Blood pressure 124/59, pulse 108, temperature 98.1 F (36.7 C), temperature source Oral, resp. rate 18, last menstrual period 10/04/2012.  Physical Exam  Nursing note and vitals reviewed. Constitutional: She is oriented to person, place, and time. She appears well-developed and well-nourished.  HENT:  Head: Normocephalic.  Eyes: EOM are normal.  Neck: Neck supple.  GI: Soft. There is no tenderness.  No contractions palpated.  No contractions seen on monitor.  FHT baseline is 140 on monitor.  10x10 accels seen - reassuring.    Genitourinary:  Speculum exam: Vagina - Small amount of creamy discharge, no pooling Cervix - small amount of contact bleeding noted, no leaking with valsalva Bimanual exam: Cervix closed and thick Uterus gravid Adnexa non tender, no masses bilaterally Fern done, wet prep done Chaperone present for exam.  Musculoskeletal: Normal range of motion.  Neurological: She is alert and oriented to person, place, and time.  Skin: Skin is warm and dry.  Psychiatric: She has a normal mood and affect.    MAU Course  Procedures  MDM Results for orders placed during the hospital encounter of 05/14/13 (from the past 24  hour(s))  URINALYSIS, ROUTINE W REFLEX MICROSCOPIC     Status: Abnormal   Collection Time    05/14/13  6:40 PM      Result Value Range   Color, Urine YELLOW  YELLOW   APPearance CLEAR  CLEAR   Specific Gravity, Urine 1.020  1.005 - 1.030   pH 6.0  5.0 - 8.0   Glucose, UA NEGATIVE  NEGATIVE mg/dL   Hgb urine dipstick TRACE (*) NEGATIVE   Bilirubin Urine NEGATIVE  NEGATIVE   Ketones, ur NEGATIVE  NEGATIVE mg/dL   Protein, ur NEGATIVE  NEGATIVE mg/dL   Urobilinogen, UA 0.2  0.0 - 1.0 mg/dL   Nitrite NEGATIVE  NEGATIVE   Leukocytes, UA MODERATE (*) NEGATIVE  URINE MICROSCOPIC-ADD ON     Status: Abnormal   Collection  Time    05/14/13  6:40 PM      Result Value Range   Squamous Epithelial / LPF FEW (*) RARE   WBC, UA 3-6  <3 WBC/hpf   RBC / HPF 0-2  <3 RBC/hpf   Bacteria, UA FEW (*) RARE   Urine-Other TRICHOMONAS PRESENT    WET PREP, GENITAL     Status: Abnormal   Collection Time    05/14/13  7:20 PM      Result Value Range   Yeast Wet Prep HPF POC NONE SEEN  NONE SEEN   Trich, Wet Prep FEW (*) NONE SEEN   Clue Cells Wet Prep HPF POC NONE SEEN  NONE SEEN   WBC, Wet Prep HPF POC MANY (*) NONE SEEN     Assessment and Plan  Trichomonas No preterm labor  Plan Metronidazole 500 mg BID x 7 days (has had single dose treatments previously and may not be getting cleared) Discussed at length the importance of partner being treated. No sex until 7 days after partner is treated. Keep appointments as scheduled in the office. Return to MAU if you have increasing pain or bleeding.  Katiana Ruland 05/14/2013, 7:20 PM

## 2013-05-14 NOTE — Discharge Instructions (Signed)
No sex until 7 days after partner is treated. Keep appointments as scheduled in the office. Return to MAU if you have increasing pain or bleeding. Get your prescription filled and take as directed.

## 2013-05-16 LAB — URINE CULTURE

## 2013-07-07 LAB — OB RESULTS CONSOLE GC/CHLAMYDIA
CHLAMYDIA, DNA PROBE: NEGATIVE
GC PROBE AMP, GENITAL: NEGATIVE

## 2013-07-07 LAB — OB RESULTS CONSOLE GBS: STREP GROUP B AG: POSITIVE

## 2013-07-15 ENCOUNTER — Other Ambulatory Visit (HOSPITAL_COMMUNITY): Payer: Self-pay | Admitting: Obstetrics

## 2013-07-15 DIAGNOSIS — Z3689 Encounter for other specified antenatal screening: Secondary | ICD-10-CM

## 2013-07-18 ENCOUNTER — Ambulatory Visit (HOSPITAL_COMMUNITY)
Admission: RE | Admit: 2013-07-18 | Discharge: 2013-07-18 | Disposition: A | Discharge status: Home / Self Care | Payer: Medicaid Other | Source: Ambulatory Visit | Attending: Obstetrics | Admitting: Obstetrics

## 2013-07-18 DIAGNOSIS — O10019 Pre-existing essential hypertension complicating pregnancy, unspecified trimester: Secondary | ICD-10-CM | POA: Insufficient documentation

## 2013-07-18 DIAGNOSIS — O365990 Maternal care for other known or suspected poor fetal growth, unspecified trimester, not applicable or unspecified: Secondary | ICD-10-CM | POA: Insufficient documentation

## 2013-07-18 DIAGNOSIS — E669 Obesity, unspecified: Secondary | ICD-10-CM | POA: Insufficient documentation

## 2013-07-18 DIAGNOSIS — O99210 Obesity complicating pregnancy, unspecified trimester: Secondary | ICD-10-CM

## 2013-07-18 DIAGNOSIS — O34219 Maternal care for unspecified type scar from previous cesarean delivery: Secondary | ICD-10-CM | POA: Insufficient documentation

## 2013-07-18 DIAGNOSIS — Z3689 Encounter for other specified antenatal screening: Secondary | ICD-10-CM

## 2013-07-19 ENCOUNTER — Encounter (HOSPITAL_COMMUNITY): Payer: Self-pay | Admitting: Emergency Medicine

## 2013-07-19 ENCOUNTER — Emergency Department (HOSPITAL_COMMUNITY)
Admission: EM | Admit: 2013-07-19 | Discharge: 2013-07-19 | Disposition: A | Discharge status: Home / Self Care | Payer: Medicaid Other | Attending: Emergency Medicine | Admitting: Emergency Medicine

## 2013-07-19 DIAGNOSIS — N63 Unspecified lump in unspecified breast: Secondary | ICD-10-CM | POA: Insufficient documentation

## 2013-07-19 DIAGNOSIS — Z79899 Other long term (current) drug therapy: Secondary | ICD-10-CM | POA: Insufficient documentation

## 2013-07-19 DIAGNOSIS — Z87891 Personal history of nicotine dependence: Secondary | ICD-10-CM | POA: Insufficient documentation

## 2013-07-19 DIAGNOSIS — Z87440 Personal history of urinary (tract) infections: Secondary | ICD-10-CM | POA: Insufficient documentation

## 2013-07-19 DIAGNOSIS — R110 Nausea: Secondary | ICD-10-CM | POA: Insufficient documentation

## 2013-07-19 DIAGNOSIS — Z8619 Personal history of other infectious and parasitic diseases: Secondary | ICD-10-CM | POA: Insufficient documentation

## 2013-07-19 DIAGNOSIS — R079 Chest pain, unspecified: Secondary | ICD-10-CM | POA: Insufficient documentation

## 2013-07-19 DIAGNOSIS — O169 Unspecified maternal hypertension, unspecified trimester: Secondary | ICD-10-CM | POA: Insufficient documentation

## 2013-07-19 DIAGNOSIS — N6320 Unspecified lump in the left breast, unspecified quadrant: Secondary | ICD-10-CM

## 2013-07-19 DIAGNOSIS — O9989 Other specified diseases and conditions complicating pregnancy, childbirth and the puerperium: Secondary | ICD-10-CM | POA: Insufficient documentation

## 2013-07-19 NOTE — ED Notes (Signed)
Patient is [redacted] weeks pregnant, states she has been putting off seeking care for this problem, but for about a week she has had gradually worsening left breast pain  That she describes as soreness.  Denies fever, chills, nausea, vomiting

## 2013-07-19 NOTE — ED Provider Notes (Signed)
CSN: [id number]     Arrival date & time 07/19/13  0800 History   First MD Initiated Contact with Patient 07/19/13 0805     Chief Complaint  Patient presents with  . Breast Pain     (Consider location/radiation/quality/duration/timing/severity/associated sxs/prior Treatment) HPI Pt is a 23yo female with hx of HTN presenting today [redacted] week pregnant, c/o left breast mass and tenderness that started about 1 week ago. Pt also reports some increased swelling in left breast. Pain is left breast is gradually worsening, 7/10, aching, sore, and tender. Intermittent in nature. Denies nipple discharge. Denies warmth or redness to area of tenderness.  Denies fever or vomiting. Reports nausea but states consistent with her pregnancy.  Pt reports having f/u with OB/GYN on Thursday, 3/19.  Pt states she has not discussed left breast with OB/GYN yet.  Past Medical History  Diagnosis Date  . Hypertension   . Urinary tract infection   . Chlamydia    Past Surgical History  Procedure Laterality Date  . Cesarean section    . Dilation and evacuation N/A 10/04/2012    Procedure: DILATATION AND EVACUATION;  Surgeon: Lesly Dukes, MD;  Location: [facility];  Service: Gynecology;  Laterality: N/A;   Family History  Problem Relation Age of Onset  . Anesthesia problems Neg Hx   . Diabetes Sister   . Hypertension Paternal Grandmother    History  Substance Use Topics  . Smoking status: Former Smoker -- 0.25 packs/day    Quit date: 10/31/2012  . Smokeless tobacco: Never Used  . Alcohol Use: No   OB History   Grav Para Term Preterm Abortions TAB SAB Ect Mult Living   4 2 2  0 1 0 1 0 0 2     Review of Systems  Constitutional: Negative for fever and chills.  Respiratory: Negative for shortness of breath and stridor.   Cardiovascular: Positive for chest pain ( left breast pain). Negative for palpitations and leg swelling.  Gastrointestinal: Positive for nausea (consistent with pt's pregnancy, no new nausea).  Negative for vomiting and abdominal pain.  All other systems reviewed and are negative.      Allergies  Review of patient's allergies indicates no known allergies.  Home Medications   Current Outpatient Rx  Name  Route  Sig  Dispense  Refill  . Prenatal Vit-Fe Fumarate-FA (PRENATAL MULTIVITAMIN) TABS tablet   Oral   Take 1 tablet by mouth daily at 12 noon.          BP 125/65  Pulse 89  Temp(Src) 97.9 F (36.6 C) (Oral)  Resp 19  SpO2 100%  LMP 10/04/2012 Physical Exam  Nursing note and vitals reviewed. Constitutional: She appears well-developed and well-nourished. No distress.  Pt lying comfortably in exam bed, NAD.   HENT:  Head: Normocephalic and atraumatic.  Eyes: Conjunctivae are normal. No scleral icterus.  Neck: Normal range of motion.  Cardiovascular: Normal rate, regular rhythm and normal heart sounds.   Pulmonary/Chest: Effort normal and breath sounds normal. No respiratory distress. She has no wheezes. She has no rales. She exhibits no tenderness, no edema, no deformity, no swelling and no retraction. Right breast exhibits no inverted nipple, no mass, no nipple discharge, no skin change and no tenderness. Left breast exhibits mass and tenderness. Left breast exhibits no inverted nipple, no nipple discharge and no skin change.    Pea sized hard mass to left breast, tender to palpation. No warmth, mild erythema. No retraction of skin.  No red  streaking. No nipple discharge.  Musculoskeletal: Normal range of motion.  Neurological: She is alert.  Skin: Skin is warm and dry. She is not diaphoretic.  Psychiatric: She has a normal mood and affect. Her behavior is normal.    ED Course  Procedures (including critical care time) Labs Review Labs Reviewed - No data to display Imaging Review [facility] + 14 Wk  07/18/2013   OBSTETRICAL ULTRASOUND: This exam was performed within a [facility]. The OB US report was generated in the [facility] system, and  faxed to the ordering physician.   This report is also available in [facility]'s [employer] and in the [facility] [employer] PACS. See [facility] Obstetric US report.    EKG Interpretation None      MDM   Final diagnoses:  None    Pt is a 23yo female presenting to ED c/o gradually worsening left breast mass pain that started 1 week ago.  Reports hx of same in right breast 1 year ago and was tx with antibiotics. Pt denies fever or vomiting.  On exam, pea sized hard mass that is tender.  No retraction of skin. No nipple discharge.  Discussed pt with Dr. Patria Maneampos, will tx with warm compresses 3-4x daily and f/u as scheduled with OB/GYN in 2 days. Pt may also take tylenol as needed for pain. Return precautions provided. Pt verbalized understanding and agreement with tx plan.     Junius Finnerrin O'Malley, PA-C 07/19/13 [phone]

## 2013-07-19 NOTE — Discharge Instructions (Signed)
Use warm compresses 3-4 times a day to affected area.  You may take tylenol as needed for pain. Be sure to follow up with your OB/GYN on Thursday, as previously scheduled, and ask provided to recheck the mass in your breast.  If symptoms not improving, OB/GYN may recommend you follow up with breast center, information provided at the beginning of this packet.  Return to ER for NEW or worsening symptoms.

## 2013-07-19 NOTE — ED Provider Notes (Signed)
Medical screening examination/treatment/procedure(s) were conducted as a shared visit with non-physician practitioner(s) and myself.  I personally evaluated the patient during the encounter.   EKG Interpretation None      Possible early follicultiis. Doubt abscess. Home with warm compresses. Return instructions given  Lyanne CoKevin M Laporshia Hogen, MD 07/19/13 [phone]

## 2013-08-02 ENCOUNTER — Inpatient Hospital Stay (HOSPITAL_COMMUNITY)
Admission: AD | Admit: 2013-08-02 | Discharge: 2013-08-02 | Disposition: A | Discharge status: Home / Self Care | Payer: Medicaid Other | Source: Ambulatory Visit | Attending: Obstetrics | Admitting: Obstetrics

## 2013-08-02 ENCOUNTER — Encounter (HOSPITAL_COMMUNITY): Payer: Self-pay | Admitting: *Deleted

## 2013-08-02 DIAGNOSIS — K529 Noninfective gastroenteritis and colitis, unspecified: Secondary | ICD-10-CM

## 2013-08-02 DIAGNOSIS — K5289 Other specified noninfective gastroenteritis and colitis: Secondary | ICD-10-CM | POA: Insufficient documentation

## 2013-08-02 DIAGNOSIS — O10019 Pre-existing essential hypertension complicating pregnancy, unspecified trimester: Secondary | ICD-10-CM | POA: Insufficient documentation

## 2013-08-02 DIAGNOSIS — Z87891 Personal history of nicotine dependence: Secondary | ICD-10-CM | POA: Insufficient documentation

## 2013-08-02 DIAGNOSIS — O99891 Other specified diseases and conditions complicating pregnancy: Secondary | ICD-10-CM

## 2013-08-02 DIAGNOSIS — O9989 Other specified diseases and conditions complicating pregnancy, childbirth and the puerperium: Principal | ICD-10-CM

## 2013-08-02 HISTORY — DX: Unspecified infectious disease: B99.9

## 2013-08-02 HISTORY — DX: Unspecified abnormal cytological findings in specimens from vagina: R87.629

## 2013-08-02 LAB — URINALYSIS, ROUTINE W REFLEX MICROSCOPIC
BILIRUBIN URINE: NEGATIVE
Glucose, UA: NEGATIVE mg/dL
Hgb urine dipstick: NEGATIVE
Ketones, ur: 40 mg/dL — AB
LEUKOCYTES UA: NEGATIVE
NITRITE: NEGATIVE
PH: 6 (ref 5.0–8.0)
Protein, ur: NEGATIVE mg/dL
Urobilinogen, UA: 0.2 mg/dL (ref 0.0–1.0)

## 2013-08-02 MED ORDER — LACTATED RINGERS IV BOLUS (SEPSIS)
1000.0000 mL | Freq: Once | INTRAVENOUS | Status: AC
  Administered 2013-08-02: 1000 mL via INTRAVENOUS

## 2013-08-02 MED ORDER — ONDANSETRON HCL 4 MG/2ML IJ SOLN
4.0000 mg | Freq: Once | INTRAMUSCULAR | Status: AC
  Administered 2013-08-02: 4 mg via INTRAVENOUS
  Filled 2013-08-02: qty 2

## 2013-08-02 NOTE — MAU Note (Signed)
Patient states she has had nausea, no vomiting and diarrhea since yesterday. States she had vomiting twice on 2-26 but none since. States she is having abdominal pressure and low back pain. Denies bleeding or leaking and reports good fetal movement.

## 2013-08-02 NOTE — MAU Note (Signed)
Vomited twice on Thursday- nausea since. Diarrhea started yesterday.  No one else at home has it.

## 2013-08-02 NOTE — MAU Provider Note (Signed)
History     CSN: [id number]  Arrival date and time: 08/02/13 1544   First Provider Initiated Contact with Patient 08/02/13 1705      Chief Complaint  Patient presents with  . Nausea  . Diarrhea  . Back Pain  . Abdominal Pain   HPI  Pt is [id number] who prestns with nausea  since thurs but not vomiting.  Last night pt started with diarrhea.  Every time she eats something, It goes right through her.  Pt denies fever or vomiting, spotting or leakage of fluid.  Pt has occ ctx, baby active. Pt has not had diarrhea since In MAU. Pt denies vaginal discharge, bleeding or UTI sx.  Past Medical History  Diagnosis Date  . Hypertension   . Urinary tract infection   . Chlamydia   . Infection     UTI  . Vaginal Pap smear, abnormal     f/u was ok    Past Surgical History  Procedure Laterality Date  . Cesarean section    . Dilation and evacuation N/A 10/04/2012    Procedure: DILATATION AND EVACUATION;  Surgeon: Lesly Dukes, MD;  Location: [facility];  Service: Gynecology;  Laterality: N/A;    Family History  Problem Relation Age of Onset  . Anesthesia problems Neg Hx   . Hearing loss Neg Hx   . Diabetes Sister   . Hypertension Paternal Grandmother   . Cancer Paternal Grandfather     History  Substance Use Topics  . Smoking status: Former Smoker -- 0.25 packs/day    Quit date: 10/31/2012  . Smokeless tobacco: Never Used  . Alcohol Use: No    Allergies: No Known Allergies  Prescriptions prior to admission  Medication Sig Dispense Refill  . cephALEXin (KEFLEX) 500 MG capsule Take 500 mg by mouth 4 (four) times daily.      . Prenatal Vit-Fe Fumarate-FA (PRENATAL MULTIVITAMIN) TABS tablet Take 1 tablet by mouth daily at 12 noon.        Review of Systems  Constitutional: Negative for fever and chills.  Gastrointestinal: Positive for nausea, abdominal pain and diarrhea. Negative for vomiting and constipation.  Genitourinary: Negative for dysuria and urgency.   Physical Exam    Blood pressure 136/73, pulse 104, temperature 98.3 F (36.8 C), temperature source Oral, resp. rate 16, height 5' 2.5" (1.588 m), weight 107.502 kg (237 lb), last menstrual period 10/04/2012, SpO2 97.00%.  Physical Exam  Constitutional: She is oriented to person, place, and time. She appears well-developed and well-nourished. No distress.  HENT:  Head: Normocephalic.  Eyes: Pupils are equal, round, and reactive to light.  Neck: Normal range of motion. Neck supple.  Cardiovascular: Normal rate.   Respiratory: Effort normal.  GI: Soft.  Musculoskeletal: Normal range of motion.  Neurological: She is alert and oriented to person, place, and time.  Skin: Skin is warm and dry.  Psychiatric: She has a normal mood and affect.    MAU Course  Procedures Pt given Zofran ODt and 2 liters of IVF and felt much better- pt able to tolerate PO fluids and crackers without diarrhea or vomiting Pt ready to go home. Results for orders placed during the hospital encounter of 08/02/13 (from the past 24 hour(s))  URINALYSIS, ROUTINE W REFLEX MICROSCOPIC     Status: Abnormal   Collection Time    08/02/13  4:05 PM      Result Value Ref Range   Color, Urine YELLOW  YELLOW   APPearance CLEAR  CLEAR  Specific Gravity, Urine >1.030 (*) 1.005 - 1.030   pH 6.0  5.0 - 8.0   Glucose, UA NEGATIVE  NEGATIVE mg/dL   Hgb urine dipstick NEGATIVE  NEGATIVE   Bilirubin Urine NEGATIVE  NEGATIVE   Ketones, ur 40 (*) NEGATIVE mg/dL   Protein, ur NEGATIVE  NEGATIVE mg/dL   Urobilinogen, UA 0.2  0.0 - 1.0 mg/dL   Nitrite NEGATIVE  NEGATIVE   Leukocytes, UA NEGATIVE  NEGATIVE  FHR reactive baseline 120 bpm with 15x15 accelerations and no decelerations Assessment and Plan  Gastroenteritis D/C home- diet for diarrhea F/u with Dr. Gaynell FaceMarshall for OB appt  Akia Montalban 08/02/2013, 5:06 PM

## 2013-08-07 ENCOUNTER — Inpatient Hospital Stay (HOSPITAL_COMMUNITY): Payer: Medicaid Other | Admitting: Anesthesiology

## 2013-08-07 ENCOUNTER — Encounter (HOSPITAL_COMMUNITY): Payer: Medicaid Other | Admitting: Anesthesiology

## 2013-08-07 ENCOUNTER — Encounter (HOSPITAL_COMMUNITY): Payer: Self-pay | Admitting: Family

## 2013-08-07 ENCOUNTER — Inpatient Hospital Stay (HOSPITAL_COMMUNITY)
Admission: AD | Admit: 2013-08-07 | Discharge: 2013-08-09 | DRG: 774 | Disposition: A | Discharge status: Home / Self Care | Payer: Medicaid Other | Source: Ambulatory Visit | Attending: Obstetrics & Gynecology | Admitting: Obstetrics & Gynecology

## 2013-08-07 DIAGNOSIS — IMO0001 Reserved for inherently not codable concepts without codable children: Secondary | ICD-10-CM

## 2013-08-07 DIAGNOSIS — O1002 Pre-existing essential hypertension complicating childbirth: Secondary | ICD-10-CM | POA: Diagnosis present

## 2013-08-07 DIAGNOSIS — Z87891 Personal history of nicotine dependence: Secondary | ICD-10-CM

## 2013-08-07 DIAGNOSIS — O34219 Maternal care for unspecified type scar from previous cesarean delivery: Secondary | ICD-10-CM | POA: Diagnosis present

## 2013-08-07 DIAGNOSIS — E669 Obesity, unspecified: Secondary | ICD-10-CM | POA: Diagnosis present

## 2013-08-07 DIAGNOSIS — O99214 Obesity complicating childbirth: Secondary | ICD-10-CM

## 2013-08-07 LAB — CBC
HCT: 31 % — ABNORMAL LOW (ref 36.0–46.0)
HEMOGLOBIN: 10 g/dL — AB (ref 12.0–15.0)
MCH: 25.5 pg — AB (ref 26.0–34.0)
MCHC: 32.3 g/dL (ref 30.0–36.0)
MCV: 79.1 fL (ref 78.0–100.0)
Platelets: 228 10*3/uL (ref 150–400)
RBC: 3.92 MIL/uL (ref 3.87–5.11)
RDW: 15.8 % — ABNORMAL HIGH (ref 11.5–15.5)
WBC: 9.5 10*3/uL (ref 4.0–10.5)

## 2013-08-07 LAB — TYPE AND SCREEN
ABO/RH(D): B POS
ANTIBODY SCREEN: NEGATIVE

## 2013-08-07 LAB — RPR: RPR Ser Ql: NONREACTIVE

## 2013-08-07 MED ORDER — OXYCODONE-ACETAMINOPHEN 5-325 MG PO TABS: 1.0000 | ORAL_TABLET | ORAL | Status: DC | PRN

## 2013-08-07 MED ORDER — TETANUS-DIPHTH-ACELL PERTUSSIS 5-2.5-18.5 LF-MCG/0.5 IM SUSP: 0.5000 mL | Freq: Once | INTRAMUSCULAR | Status: DC

## 2013-08-07 MED ORDER — LACTATED RINGERS IV SOLN: 500.0000 mL | INTRAVENOUS | Status: DC | PRN

## 2013-08-07 MED ORDER — IBUPROFEN 600 MG PO TABS
600.0000 mg | ORAL_TABLET | Freq: Four times a day (QID) | ORAL | Status: DC
  Administered 2013-08-07 – 2013-08-09 (×7): 600 mg via ORAL
  Filled 2013-08-07 (×7): qty 1

## 2013-08-07 MED ORDER — TERBUTALINE SULFATE 1 MG/ML IJ SOLN: 0.2500 mg | Freq: Once | INTRAMUSCULAR | Status: DC | PRN

## 2013-08-07 MED ORDER — FLEET ENEMA 7-19 GM/118ML RE ENEM: 1.0000 | ENEMA | RECTAL | Status: DC | PRN

## 2013-08-07 MED ORDER — ONDANSETRON HCL 4 MG PO TABS: 4.0000 mg | ORAL_TABLET | ORAL | Status: DC | PRN

## 2013-08-07 MED ORDER — OXYTOCIN BOLUS FROM INFUSION
500.0000 mL | INTRAVENOUS | Status: DC
Start: 2013-08-07 — End: 2013-08-07
  Administered 2013-08-07: 500 mL via INTRAVENOUS

## 2013-08-07 MED ORDER — EPHEDRINE 5 MG/ML INJ
10.0000 mg | INTRAVENOUS | Status: DC | PRN
  Filled 2013-08-07: qty 2

## 2013-08-07 MED ORDER — CITRIC ACID-SODIUM CITRATE 334-500 MG/5ML PO SOLN: 30.0000 mL | ORAL | Status: DC | PRN

## 2013-08-07 MED ORDER — ONDANSETRON HCL 4 MG/2ML IJ SOLN: 4.0000 mg | INTRAMUSCULAR | Status: DC | PRN

## 2013-08-07 MED ORDER — LACTATED RINGERS IV SOLN
INTRAVENOUS | Status: DC
  Administered 2013-08-07 (×2): 125 mL/h via INTRAVENOUS

## 2013-08-07 MED ORDER — FENTANYL 2.5 MCG/ML BUPIVACAINE 1/10 % EPIDURAL INFUSION (WH - ANES)
14.0000 mL/h | INTRAMUSCULAR | Status: DC | PRN
  Filled 2013-08-07: qty 125

## 2013-08-07 MED ORDER — PENICILLIN G POTASSIUM 5000000 UNITS IJ SOLR
2.5000 10*6.[IU] | INTRAVENOUS | Status: DC
  Administered 2013-08-07: 2.5 10*6.[IU] via INTRAVENOUS
  Filled 2013-08-07 (×3): qty 2.5

## 2013-08-07 MED ORDER — DIBUCAINE 1 % RE OINT: 1.0000 "application " | TOPICAL_OINTMENT | RECTAL | Status: DC | PRN

## 2013-08-07 MED ORDER — OXYTOCIN 40 UNITS IN LACTATED RINGERS INFUSION - SIMPLE MED
1.0000 m[IU]/min | INTRAVENOUS | Status: DC
  Administered 2013-08-07: 1 m[IU]/min via INTRAVENOUS
  Filled 2013-08-07: qty 1000

## 2013-08-07 MED ORDER — DIPHENHYDRAMINE HCL 25 MG PO CAPS: 25.0000 mg | ORAL_CAPSULE | Freq: Four times a day (QID) | ORAL | Status: DC | PRN

## 2013-08-07 MED ORDER — OXYCODONE-ACETAMINOPHEN 5-325 MG PO TABS
1.0000 | ORAL_TABLET | ORAL | Status: DC | PRN
  Administered 2013-08-08: 1 via ORAL
  Administered 2013-08-09: 2 via ORAL
  Filled 2013-08-07 (×2): qty 2

## 2013-08-07 MED ORDER — EPHEDRINE 5 MG/ML INJ
10.0000 mg | INTRAVENOUS | Status: DC | PRN
  Filled 2013-08-07: qty 4
  Filled 2013-08-07: qty 2

## 2013-08-07 MED ORDER — IBUPROFEN 600 MG PO TABS: 600.0000 mg | ORAL_TABLET | Freq: Four times a day (QID) | ORAL | Status: DC | PRN

## 2013-08-07 MED ORDER — PHENYLEPHRINE 40 MCG/ML (10ML) SYRINGE FOR IV PUSH (FOR BLOOD PRESSURE SUPPORT)
80.0000 ug | PREFILLED_SYRINGE | INTRAVENOUS | Status: DC | PRN
  Filled 2013-08-07: qty 2

## 2013-08-07 MED ORDER — PHENYLEPHRINE 40 MCG/ML (10ML) SYRINGE FOR IV PUSH (FOR BLOOD PRESSURE SUPPORT)
80.0000 ug | PREFILLED_SYRINGE | INTRAVENOUS | Status: DC | PRN
  Filled 2013-08-07: qty 2
  Filled 2013-08-07: qty 10

## 2013-08-07 MED ORDER — DIPHENHYDRAMINE HCL 50 MG/ML IJ SOLN: 12.5000 mg | INTRAMUSCULAR | Status: DC | PRN

## 2013-08-07 MED ORDER — ZOLPIDEM TARTRATE 5 MG PO TABS
5.0000 mg | ORAL_TABLET | Freq: Every evening | ORAL | Status: DC | PRN
Start: 2013-08-07 — End: 2013-08-09

## 2013-08-07 MED ORDER — DEXTROSE 5 % IV SOLN
5.0000 10*6.[IU] | Freq: Once | INTRAVENOUS | Status: AC
  Administered 2013-08-07: 5 10*6.[IU] via INTRAVENOUS
  Filled 2013-08-07: qty 5

## 2013-08-07 MED ORDER — LIDOCAINE HCL (PF) 1 % IJ SOLN
30.0000 mL | INTRAMUSCULAR | Status: DC | PRN
  Administered 2013-08-07: 30 mL via SUBCUTANEOUS
  Filled 2013-08-07: qty 30

## 2013-08-07 MED ORDER — MEASLES, MUMPS & RUBELLA VAC ~~LOC~~ INJ
0.5000 mL | INJECTION | Freq: Once | SUBCUTANEOUS | Status: DC
  Filled 2013-08-07: qty 0.5

## 2013-08-07 MED ORDER — FENTANYL 2.5 MCG/ML BUPIVACAINE 1/10 % EPIDURAL INFUSION (WH - ANES)
INTRAMUSCULAR | Status: DC | PRN
  Administered 2013-08-07: 14 mL/h via EPIDURAL

## 2013-08-07 MED ORDER — LANOLIN HYDROUS EX OINT: TOPICAL_OINTMENT | CUTANEOUS | Status: DC | PRN

## 2013-08-07 MED ORDER — ONDANSETRON HCL 4 MG/2ML IJ SOLN: 4.0000 mg | Freq: Four times a day (QID) | INTRAMUSCULAR | Status: DC | PRN

## 2013-08-07 MED ORDER — WITCH HAZEL-GLYCERIN EX PADS: 1.0000 "application " | MEDICATED_PAD | CUTANEOUS | Status: DC | PRN

## 2013-08-07 MED ORDER — SENNOSIDES-DOCUSATE SODIUM 8.6-50 MG PO TABS
2.0000 | ORAL_TABLET | ORAL | Status: DC
  Administered 2013-08-07 – 2013-08-08 (×2): 2 via ORAL
  Filled 2013-08-07 (×2): qty 2

## 2013-08-07 MED ORDER — BENZOCAINE-MENTHOL 20-0.5 % EX AERO
1.0000 "application " | INHALATION_SPRAY | CUTANEOUS | Status: DC | PRN
  Administered 2013-08-08: 1 via TOPICAL
  Filled 2013-08-07: qty 56

## 2013-08-07 MED ORDER — MAGNESIUM HYDROXIDE 400 MG/5ML PO SUSP: 30.0000 mL | ORAL | Status: DC | PRN

## 2013-08-07 MED ORDER — LIDOCAINE HCL (PF) 1 % IJ SOLN
INTRAMUSCULAR | Status: DC | PRN
  Administered 2013-08-07: 9 mL
  Administered 2013-08-07: 8 mL

## 2013-08-07 MED ORDER — ACETAMINOPHEN 325 MG PO TABS: 650.0000 mg | ORAL_TABLET | ORAL | Status: DC | PRN

## 2013-08-07 MED ORDER — BUTORPHANOL TARTRATE 1 MG/ML IJ SOLN
2.0000 mg | INTRAMUSCULAR | Status: DC | PRN
Start: 2013-08-07 — End: 2013-08-07
  Administered 2013-08-07 (×2): 2 mg via INTRAVENOUS
  Filled 2013-08-07 (×2): qty 2

## 2013-08-07 MED ORDER — LACTATED RINGERS IV SOLN: 500.0000 mL | Freq: Once | INTRAVENOUS | Status: DC

## 2013-08-07 MED ORDER — FERROUS SULFATE 325 (65 FE) MG PO TABS
325.0000 mg | ORAL_TABLET | Freq: Two times a day (BID) | ORAL | Status: DC
  Administered 2013-08-08 – 2013-08-09 (×3): 325 mg via ORAL
  Filled 2013-08-07 (×3): qty 1

## 2013-08-07 MED ORDER — OXYTOCIN 40 UNITS IN LACTATED RINGERS INFUSION - SIMPLE MED: 62.5000 mL/h | INTRAVENOUS | Status: DC

## 2013-08-07 MED ORDER — PRENATAL MULTIVITAMIN CH
1.0000 | ORAL_TABLET | Freq: Every day | ORAL | Status: DC
  Administered 2013-08-08 – 2013-08-09 (×2): 1 via ORAL
  Filled 2013-08-07 (×2): qty 1

## 2013-08-07 NOTE — Anesthesia Preprocedure Evaluation (Signed)
Anesthesia Evaluation  Patient identified by MRN, date of birth, ID band Patient awake    Reviewed: Allergy & Precautions, H&P , NPO status , Patient's Chart, lab work & pertinent test results  Airway Mallampati: II TM Distance: >3 FB Neck ROM: full    Dental no notable dental hx.    Pulmonary former smoker,    Pulmonary exam normal       Cardiovascular hypertension,     Neuro/Psych negative neurological ROS  negative psych ROS   GI/Hepatic negative GI ROS, Neg liver ROS,   Endo/Other  Morbid obesity  Renal/GU negative Renal ROS     Musculoskeletal   Abdominal (+) + obese,   Peds  Hematology negative hematology ROS (+)   Anesthesia Other Findings   Reproductive/Obstetrics (+) Pregnancy                           Anesthesia Physical Anesthesia Plan  ASA: III  Anesthesia Plan: Epidural   Post-op Pain Management:    Induction:   Airway Management Planned:   Additional Equipment:   Intra-op Plan:   Post-operative Plan:   Informed Consent: I have reviewed the patients History and Physical, chart, labs and discussed the procedure including the risks, benefits and alternatives for the proposed anesthesia with the patient or authorized representative who has indicated his/her understanding and acceptance.     Plan Discussed with:   Anesthesia Plan Comments:         Anesthesia Quick Evaluation

## 2013-08-07 NOTE — H&P (Signed)
Marlyce HugeShaundra R Sparr is a 23 y.o. female presenting for contractions. Maternal Medical History:  Reason for admission: Contractions.   Contractions: Onset was 6-12 hours ago.   Frequency: regular.   Perceived severity is strong.    Fetal activity: Perceived fetal activity is normal.    Prenatal complications: no prenatal complications Prenatal Complications - Diabetes: none.    OB History   Grav Para Term Preterm Abortions TAB SAB Ect Mult Living   4 2 2  0 1 0 1 0 0 2     Past Medical History  Diagnosis Date  . Hypertension   . Urinary tract infection   . Chlamydia   . Infection     UTI  . Vaginal Pap smear, abnormal     f/u was ok   Past Surgical History  Procedure Laterality Date  . Cesarean section    . Dilation and evacuation N/A 10/04/2012    Procedure: DILATATION AND EVACUATION;  Surgeon: Lesly DukesKelly H Leggett, MD;  Location: [facility];  Service: Gynecology;  Laterality: N/A;   Family History: family history includes Cancer in her paternal grandfather; Diabetes in her sister; Hypertension in her paternal grandmother. There is no history of Anesthesia problems or Hearing loss. Social History:  reports that she quit smoking about 9 months ago. She has never used smokeless tobacco. She reports that she does not drink alcohol or use illicit drugs.     Review of Systems  Constitutional: Negative for fever.  Eyes: Negative for blurred vision.  Respiratory: Negative for shortness of breath.   Gastrointestinal: Negative for vomiting.  Skin: Negative for rash.  Neurological: Negative for headaches.    Dilation: 4 Effacement (%): 80 Station: -2 Exam by:: Dr. Tamela OddiJackson-Moore Blood pressure 137/69, pulse 89, temperature 98 F (36.7 C), temperature source Oral, resp. rate 20, height 5\' 2"  (1.575 m), weight 107.502 kg (237 lb), last menstrual period 10/04/2012. LOT, IUPC placed Maternal Exam:  Uterine Assessment: Contraction frequency is regular.   Abdomen: Patient reports no  abdominal tenderness. Estimated fetal weight is 3400 -3600 gm by Leopolds'.   Fetal presentation: vertex  Introitus: Normal vulva. Amniotic fluid character: clear.     Fetal Exam Fetal Monitor Review: Baseline rate: 140.  Variability: moderate (6-25 bpm).   Pattern: accelerations present and no decelerations.    Fetal State Assessment: Category I - tracings are normal.     Physical Exam  Constitutional: She appears well-developed.  HENT:  Head: Normocephalic.  Neck: Neck supple. No thyromegaly present.  Cardiovascular: Normal rate and regular rhythm.   Respiratory: Breath sounds normal.  GI: Soft. Bowel sounds are normal.  Skin: No rash noted.    Prenatal labs: ABO, Rh:   Antibody:   Rubella:   RPR:    HBsAg:    HIV:    GBS:     Assessment/Plan: Multipara at term, active labor, previous C/D, h/o VBAC, desires TOLAC, GBS pos, Category 1 FHT  Admit, PCN GBS prophylaxisanticipate an NSVD   JACKSON-MOORE,Per Beagley A 08/07/2013, 2:44 PM

## 2013-08-07 NOTE — MAU Note (Signed)
23 yo, G4P2 at 1549w2d, presents to MAU with c/o contractions every 5-7 minutes. Reports +FM; denies VB, LOF.  She desires TOLAC; no consent to date.

## 2013-08-07 NOTE — Anesthesia Procedure Notes (Signed)
Epidural Patient location during procedure: OB Start time: 08/07/2013 6:23 PM End time: 08/07/2013 6:27 PM  Staffing Anesthesiologist: Leilani AbleHATCHETT, Deanglo Hissong Performed by: anesthesiologist   Preanesthetic Checklist Completed: patient identified, surgical consent, pre-op evaluation, timeout performed, IV checked, risks and benefits discussed and monitors and equipment checked  Epidural Patient position: sitting Prep: site prepped and draped and DuraPrep Patient monitoring: continuous pulse ox and blood pressure Approach: midline Location: L3-L4 Injection technique: LOR air  Needle:  Needle type: Tuohy  Needle gauge: 17 G Needle length: 9 cm and 9 Needle insertion depth: 9 cm Catheter type: closed end flexible Catheter size: 19 Gauge Catheter at skin depth: 10 cm Test dose: negative and Other  Assessment Sensory level: T9 Events: blood not aspirated, injection not painful, no injection resistance, negative IV test and no paresthesia  Additional Notes Reason for block:procedure for pain

## 2013-08-08 LAB — CBC
HCT: 30 % — ABNORMAL LOW (ref 36.0–46.0)
Hemoglobin: 9.5 g/dL — ABNORMAL LOW (ref 12.0–15.0)
MCH: 25.1 pg — ABNORMAL LOW (ref 26.0–34.0)
MCHC: 31.7 g/dL (ref 30.0–36.0)
MCV: 79.2 fL (ref 78.0–100.0)
PLATELETS: 233 10*3/uL (ref 150–400)
RBC: 3.79 MIL/uL — AB (ref 3.87–5.11)
RDW: 15.9 % — AB (ref 11.5–15.5)
WBC: 13.4 10*3/uL — AB (ref 4.0–10.5)

## 2013-08-08 NOTE — Anesthesia Postprocedure Evaluation (Signed)
Anesthesia Post Note  Patient: Faith [name]  Procedure(s) Performed: * No procedures listed *  Anesthesia type: Epidural  Patient location: Mother/Baby  Post pain: Pain level controlled  Post assessment: Post-op Vital signs reviewed  Last Vitals:  Filed Vitals:   08/08/13 0640  BP: 108/69  Pulse: 93  Temp: 37.1 C  Resp: 20    Post vital signs: Reviewed  Level of consciousness:alert  Complications: No apparent anesthesia complications

## 2013-08-08 NOTE — Progress Notes (Signed)
Ur chart review completed.  

## 2013-08-08 NOTE — Progress Notes (Signed)
Patient ID: Faith [name], female   DOB: 1991/02/16, 23 y.o.   MRN: [id number] Postpartum day one Vital signs normal Fundus firm Lochia moderate Doing well and

## 2013-08-09 NOTE — Discharge Instructions (Signed)
Discharge instructions ° °· You can wash your hair °· Shower °· Eat what you want °· Drink what you want °· See me in 6 weeks °· Your ankles are going to swell more in the next 2 weeks than when pregnant °· No sex for 6 weeks ° ° °Raylen Ken A, MD 08/09/2013 ° ° °

## 2013-08-09 NOTE — Progress Notes (Signed)
Patient ID: Faith [name], female   DOB: Sep 20, 1990, 23 y.o.   MRN: [id number] Postpartum day 2 Vital signs normal Fundus firm Lochia moderate The legs negative discharge today

## 2013-08-09 NOTE — Discharge Summary (Signed)
Obstetric Discharge Summary Reason for Admission: onset of labor Prenatal Procedures: none Intrapartum Procedures: spontaneous vaginal delivery Postpartum Procedures: none Complications-Operative and Postpartum: none Hemoglobin  Date Value Ref Range Status  08/08/2013 9.5* 12.0 - 15.0 g/dL Final     HCT  Date Value Ref Range Status  08/08/2013 30.0* 36.0 - 46.0 % Final    Physical Exam:  General: alert Lochia: appropriate Uterine Fundus: firm Incision: healing well DVT Evaluation: No evidence of DVT seen on physical exam.  Discharge Diagnoses: Term Pregnancy-delivered  Discharge Information: Date: 08/09/2013 Activity: pelvic rest Diet: routine Medications: Percocet Condition: stable Instructions: refer to practice specific booklet Discharge to: home Follow-up Information   Follow up with Kathreen CosierMARSHALL,Amare Kontos A, MD.   Specialty:  Obstetrics and Gynecology   Contact information:   [address] [place] [address] [phone]       Newborn Data: Live born female  Birth Weight: 8 lb 1.5 oz (3671 g) APGAR: 9, 9  Home with mother.  Lili Harts A 08/09/2013, 7:08 AM

## 2014-03-06 ENCOUNTER — Encounter (HOSPITAL_COMMUNITY): Payer: Self-pay | Admitting: Family

## 2014-03-07 ENCOUNTER — Emergency Department (HOSPITAL_COMMUNITY): Payer: Medicaid Other

## 2014-03-07 ENCOUNTER — Encounter (HOSPITAL_COMMUNITY): Payer: Self-pay | Admitting: Emergency Medicine

## 2014-03-07 ENCOUNTER — Emergency Department (INDEPENDENT_AMBULATORY_CARE_PROVIDER_SITE_OTHER)
Admission: EM | Admit: 2014-03-07 | Discharge: 2014-03-07 | Disposition: A | Discharge status: Home / Self Care | Payer: Medicaid Other | Source: Home / Self Care

## 2014-03-07 DIAGNOSIS — M25562 Pain in left knee: Secondary | ICD-10-CM

## 2014-03-07 DIAGNOSIS — W19XXXA Unspecified fall, initial encounter: Secondary | ICD-10-CM

## 2014-03-07 NOTE — ED Provider Notes (Signed)
CSN: [id number]     Arrival date & time 03/07/14  1050 History   None    Chief Complaint  Patient presents with  . Knee Injury   (Consider location/radiation/quality/duration/timing/severity/associated sxs/prior Treatment) HPI  L knee pain. Started last night around 18:30. R leg slipped on dust pan and fel to L knee. Immediately painful and swollen. Elevated knee overnight and iced knee w/o much improvement. Worse w/ ambulation. Has not taken any medications. Stiff. Denies locking or popping. No h/o knee trauma.    Past Medical History  Diagnosis Date  . Hypertension   . Urinary tract infection   . Chlamydia   . Infection     UTI  . Vaginal Pap smear, abnormal     f/u was ok   Past Surgical History  Procedure Laterality Date  . Cesarean section    . Dilation and evacuation N/A 10/04/2012    Procedure: DILATATION AND EVACUATION;  Surgeon: Lesly DukesKelly H Leggett, MD;  Location: [facility];  Service: Gynecology;  Laterality: N/A;   Family History  Problem Relation Age of Onset  . Anesthesia problems Neg Hx   . Hearing loss Neg Hx   . Diabetes Sister   . Hypertension Paternal Grandmother   . Cancer Paternal Grandfather    History  Substance Use Topics  . Smoking status: Former Smoker -- 0.25 packs/day    Quit date: 10/31/2012  . Smokeless tobacco: Never Used  . Alcohol Use: No   OB History    Gravida Para Term Preterm AB TAB SAB Ectopic Multiple Living   4 3 3  0 1 0 1 0 0 3     Review of Systems Per HPI with all other pertinent systems negative.   Allergies  Review of patient's allergies indicates no known allergies.  Home Medications   Prior to Admission medications   Not on File   BP 110/76 mmHg  Pulse 95  Temp(Src) 97.9 F (36.6 C) (Oral)  Resp 16  SpO2 100%  LMP 02/19/2014 Physical Exam  Constitutional: She appears well-developed and well-nourished.  HENT:  Head: Normocephalic and atraumatic.  Eyes: EOM are normal. Pupils are equal, round, and reactive to  light.  Neck: Normal range of motion.  Cardiovascular: Normal rate, normal heart sounds and intact distal pulses.   No murmur heard. Pulmonary/Chest: Effort normal and breath sounds normal.  Abdominal: Soft.  Musculoskeletal: Normal range of motion.  L knee FROM, no effusion. ttp along the patella. Negative apprehension test. Valgus and varrus stresses w/o pain. Lachman negative.   Skin: Skin is warm.  Psychiatric: She has a normal mood and affect. Her behavior is normal. Judgment and thought content normal.    ED Course  Procedures (including critical care time) Labs Review Labs Reviewed - No data to display  Imaging Review No results found.   MDM   1. Left knee pain   2. Fall    Contusion of the L knee NSAIDs, ice, rest, ROM Work note given F/u in 2 wks if not better for plain film Precautions given and all questions answered  Shelly Flattenavid Anyah Swallow, MD Family Medicine 03/07/2014, 12:46 PM      Ozella Rocksavid J Champagne Paletta, MD 03/07/14 1246

## 2014-03-07 NOTE — Discharge Instructions (Signed)
You likely bruised your kneecap. This will take time to heal. Please take ibuprofen 600mg  every 6 hours for pain Ice your knee for 30 minutes 2-4 times a day Please remember to perform range of motion exercises on your leg multiple times per day Please come back if you are not better in 2 weeks for xrays

## 2014-03-07 NOTE — ED Notes (Signed)
Reports tripping on dust pan around 6:30 last night falling on to the left knee.  Pain with walking.  Swelling noted.  Used ice pack with mild relief.

## 2014-03-08 ENCOUNTER — Emergency Department (HOSPITAL_COMMUNITY)
Admission: EM | Admit: 2014-03-08 | Discharge: 2014-03-08 | Disposition: A | Discharge status: Home / Self Care | Payer: Medicaid Other | Attending: Emergency Medicine | Admitting: Emergency Medicine

## 2014-03-08 ENCOUNTER — Emergency Department (HOSPITAL_COMMUNITY): Payer: Medicaid Other

## 2014-03-08 ENCOUNTER — Encounter (HOSPITAL_COMMUNITY): Payer: Self-pay | Admitting: *Deleted

## 2014-03-08 DIAGNOSIS — I10 Essential (primary) hypertension: Secondary | ICD-10-CM | POA: Diagnosis not present

## 2014-03-08 DIAGNOSIS — S8992XA Unspecified injury of left lower leg, initial encounter: Secondary | ICD-10-CM | POA: Insufficient documentation

## 2014-03-08 DIAGNOSIS — Z87891 Personal history of nicotine dependence: Secondary | ICD-10-CM | POA: Insufficient documentation

## 2014-03-08 DIAGNOSIS — Z87440 Personal history of urinary (tract) infections: Secondary | ICD-10-CM | POA: Diagnosis not present

## 2014-03-08 DIAGNOSIS — W010XXA Fall on same level from slipping, tripping and stumbling without subsequent striking against object, initial encounter: Secondary | ICD-10-CM | POA: Insufficient documentation

## 2014-03-08 DIAGNOSIS — M25562 Pain in left knee: Secondary | ICD-10-CM

## 2014-03-08 DIAGNOSIS — Z8619 Personal history of other infectious and parasitic diseases: Secondary | ICD-10-CM | POA: Diagnosis not present

## 2014-03-08 DIAGNOSIS — Y9289 Other specified places as the place of occurrence of the external cause: Secondary | ICD-10-CM | POA: Diagnosis not present

## 2014-03-08 DIAGNOSIS — Y9389 Activity, other specified: Secondary | ICD-10-CM | POA: Insufficient documentation

## 2014-03-08 NOTE — ED Notes (Signed)
pts vital signs updated pt awaiting discharge paperwork

## 2014-03-08 NOTE — ED Provider Notes (Signed)
CSN: [id number]     Arrival date & time 03/08/14  1757 History  This chart was scribed for non-physician practitioner working with Toy BakerAnthony T Allen, MD, by Jarvis Morganaylor Ferguson, ED Scribe. This patient was seen in room TR09C/TR09C and the patient's care was started at 7:38 PM.    Chief Complaint  Patient presents with  . Fall  . Knee Pain    The history is provided by the patient. No language interpreter was used.    HPI Comments: Faith [name] is a [age] y.o. female who presents to the Emergency Department due to a fall that occurred 2 days ago. Pt states she was sweeping the floors and she fell and hurt her left knee. She fell directly onto her flexed kneecap. Pt notes some associated difficulty walking due to pain. [facility] and did not have imaging done and was told to go home and ice the area. Pt has been icing and taking Ibuprofen with no relief. She states that the pain is her left knee has been gradually getting worse and radiating into her left calf and left foot. She denies any head injury or loss of consciousness from the fall. She denies any swelling, numbness or color change of the left knee/left leg.    Past Medical History  Diagnosis Date  . Hypertension   . Urinary tract infection   . Chlamydia   . Infection     UTI  . Vaginal Pap smear, abnormal     f/u was ok   Past Surgical History  Procedure Laterality Date  . Cesarean section    . Dilation and evacuation N/A 10/04/2012    Procedure: DILATATION AND EVACUATION;  Surgeon: Lesly DukesKelly H Leggett, MD;  Location: [facility];  Service: Gynecology;  Laterality: N/A;   Family History  Problem Relation Age of Onset  . Anesthesia problems Neg Hx   . Hearing loss Neg Hx   . Diabetes Sister   . Hypertension Paternal Grandmother   . Cancer Paternal Grandfather    History  Substance Use Topics  . Smoking status: Former Smoker -- 0.25 packs/day    Quit date: 10/31/2012  . Smokeless tobacco: Never Used  . Alcohol  Use: No   OB History    Gravida Para Term Preterm AB TAB SAB Ectopic Multiple Living   4 3 3  0 1 0 1 0 0 3     Review of Systems  Musculoskeletal: Positive for arthralgias (L knee) and gait problem (due to pain). Negative for joint swelling.  Skin: Negative for color change.  Neurological: Negative for syncope, weakness, numbness and headaches.   Allergies  Review of patient's allergies indicates no known allergies.  Home Medications   Prior to Admission medications   Not on File   Triage Vitals: BP 149/69 mmHg  Pulse 104  Temp(Src) 98.3 F (36.8 C)  Resp 16  Ht 5\' 1"  (1.549 m)  Wt 220 lb (99.791 kg)  BMI 41.59 kg/m2  SpO2 98%  LMP 02/19/2014  Physical Exam  Constitutional: She appears well-developed and well-nourished.  HENT:  Head: Normocephalic and atraumatic.  Eyes: Pupils are equal, round, and reactive to light.  Neck: Normal range of motion. Neck supple.  Cardiovascular: Exam reveals no decreased pulses.   Musculoskeletal: She exhibits tenderness. She exhibits no edema.       Left hip: Normal.       Left knee: She exhibits normal range of motion and no swelling. Tenderness found. Medial joint line  and lateral joint line tenderness noted.       Left ankle: Normal.       Left upper leg: Normal.       Left lower leg: She exhibits tenderness. She exhibits no bony tenderness and no swelling.       Left foot: Normal.  Neurological: She is alert. No sensory deficit.  Motor, sensation, and vascular distal to the injury is fully intact.   Skin: Skin is warm and dry.  Psychiatric: She has a normal mood and affect.  Nursing note and vitals reviewed.   ED Course  Procedures (including critical care time)  DIAGNOSTIC STUDIES: Oxygen Saturation is 98% on RA, normal by my interpretation.    COORDINATION OF CARE:  Labs Review Labs Reviewed - No data to display  Imaging Review Dg Knee Complete 4 Views Left  03/08/2014   CLINICAL DATA:  Pain after fall 1 day prior   EXAM: LEFT KNEE - COMPLETE 4+ VIEW  COMPARISON:  None.  FINDINGS: Frontal, lateral, and bilateral oblique views were obtained. There is no fracture, dislocation, or effusion. Joint spaces appear intact. No erosive change.  IMPRESSION: No fracture or effusion.  No appreciable arthropathic change.   Electronically Signed   By: Bretta BangWilliam  Woodruff M.D.   On: 03/08/2014 19:29     EKG Interpretation None       Vital signs reviewed and are as follows: Filed Vitals:   03/08/14 1801  BP: 149/69  Pulse: 104  Temp: 98.3 F (36.8 C)  Resp: 16   Patient was counseled on RICE protocol and told to rest injury, use ice for no longer than 15 minutes every hour, compress the area, and elevate above the level of their heart as much as possible to reduce swelling. Questions answered. Patient verbalized understanding.       MDM   Final diagnoses:  Knee pain, acute, left   Patient with knee pain after injury. X-ray negative. No distal neurological injury suspected. Rice protocol with orthopedic follow-up if not improved.  I personally performed the services described in this documentation, which was scribed in my presence. The recorded information has been reviewed and is accurate.    Renne CriglerJoshua Mcgregor Tinnon, PA-C 03/08/14 1954  Toy BakerAnthony T Allen, MD 03/08/14 [phone]

## 2014-03-08 NOTE — Discharge Instructions (Signed)
Please read and follow all provided instructions.  Your diagnoses today include:  1. Knee pain, acute, left    Tests performed today include:  An x-ray of the affected area - does NOT show any broken bones  Vital signs. See below for your results today.   Medications prescribed:   None  Take any prescribed medications only as directed.  Home care instructions:   Follow any educational materials contained in this packet  Follow R.I.C.E. Protocol:  R - rest your injury   I  - use ice on injury without applying directly to skin  C - compress injury with bandage or splint  E - elevate the injury as much as possible  Follow-up instructions: Please follow-up with your primary care provider or the provided orthopedic physician (bone specialist) if you continue to have significant pain or trouble walking in 2 weeks. In this case you may have a severe injury that requires further care.   Return instructions:   Please return if your toes are numb or tingling, appear gray or blue, or you have severe pain (also elevate leg and loosen splint or wrap if you were given one)  Please return to the Emergency Department if you experience worsening symptoms.   Please return if you have any other emergent concerns.  Additional Information:  Your vital signs today were: BP 149/69 mmHg   Pulse 104   Temp(Src) 98.3 F (36.8 C)   Resp 16   Ht 5\' 1"  (1.549 m)   Wt 220 lb (99.791 kg)   BMI 41.59 kg/m2   SpO2 98%   LMP 02/19/2014 If your blood pressure (BP) was elevated above 135/85 this visit, please have this repeated by your doctor within one month. --------------

## 2014-03-08 NOTE — ED Notes (Signed)
Pt reports slipping and falling on Monday, [facility], reports being sent home without any xray done. No relief with ibuprofen and now reports pain to entire left leg.

## 2014-08-21 ENCOUNTER — Encounter (HOSPITAL_COMMUNITY): Payer: Self-pay | Admitting: *Deleted

## 2014-08-21 ENCOUNTER — Emergency Department (HOSPITAL_COMMUNITY)
Admission: EM | Admit: 2014-08-21 | Discharge: 2014-08-21 | Disposition: A | Discharge status: Home / Self Care | Payer: Medicaid Other | Source: Home / Self Care | Attending: Family Medicine | Admitting: Family Medicine

## 2014-08-21 DIAGNOSIS — J029 Acute pharyngitis, unspecified: Secondary | ICD-10-CM | POA: Diagnosis not present

## 2014-08-21 DIAGNOSIS — B349 Viral infection, unspecified: Secondary | ICD-10-CM | POA: Diagnosis not present

## 2014-08-21 DIAGNOSIS — J040 Acute laryngitis: Secondary | ICD-10-CM | POA: Diagnosis not present

## 2014-08-21 LAB — POCT RAPID STREP A: STREPTOCOCCUS, GROUP A SCREEN (DIRECT): NEGATIVE

## 2014-08-21 MED ORDER — PREDNISONE 20 MG PO TABS
ORAL_TABLET | ORAL | Status: AC
  Filled 2014-08-21: qty 3

## 2014-08-21 MED ORDER — PREDNISONE 20 MG PO TABS
60.0000 mg | ORAL_TABLET | Freq: Once | ORAL | Status: AC
  Administered 2014-08-21: 60 mg via ORAL

## 2014-08-21 MED ORDER — FLUTICASONE PROPIONATE 50 MCG/ACT NA SUSP: 2.0000 | Freq: Every day | NASAL | Status: DC

## 2014-08-21 MED ORDER — ONDANSETRON HCL 4 MG PO TABS: 4.0000 mg | ORAL_TABLET | Freq: Three times a day (TID) | ORAL | Status: DC | PRN

## 2014-08-21 MED ORDER — IPRATROPIUM BROMIDE 0.06 % NA SOLN: 2.0000 | Freq: Four times a day (QID) | NASAL | Status: DC

## 2014-08-21 NOTE — ED Notes (Signed)
Pt  Reports  Symptoms  Of  sorethroat   Body  Aches  As   Well  As  A  Headache        With    Onset  Of  Symptoms  3 days  Ago

## 2014-08-21 NOTE — Discharge Instructions (Signed)
Your strep test was negative. Will run a second strep test and call you if it is positive for antibiotics. Her symptoms are likely due to a viral illness and possibly the flu. Please take ibuprofen 600-800 mg every 6-8 hours. Please use the Zofran for upset stomach and nausea. Please use the Flonase at night for nasal congestion and use the Atrovent during the day for runny nose and postnasal drip. He should start improving in another 1-2 days. Please drink plenty of fluids and get lots of rest.

## 2014-08-21 NOTE — ED Provider Notes (Signed)
CSN: [id number]     Arrival date & time 08/21/14  1020 History   None    Chief Complaint  Patient presents with  . Sore Throat   (Consider location/radiation/quality/duration/timing/severity/associated sxs/prior Treatment) HPI  Three-day history of cough congestion sore throat chills and body aches. Denies sinus pain or pressure, headaches, lymphadenopathy, chest pain, shortness of breath, abdominal pain, dysuria, frequency, back pain, rash, dizziness. Associated with some nausea and decreased appetite. Patient works in a daycare center. Patient reports losing her voice today. Intermittent Motrin with little benefit. Overall symptoms are fairly constant and worse in the evening.  Past Medical History  Diagnosis Date  . Hypertension   . Urinary tract infection   . Chlamydia   . Infection     UTI  . Vaginal Pap smear, abnormal     f/u was ok   Past Surgical History  Procedure Laterality Date  . Cesarean section    . Dilation and evacuation N/A 10/04/2012    Procedure: DILATATION AND EVACUATION;  Surgeon: Lesly Dukes, MD;  Location: [facility];  Service: Gynecology;  Laterality: N/A;   Family History  Problem Relation Age of Onset  . Anesthesia problems Neg Hx   . Hearing loss Neg Hx   . Diabetes Sister   . Hypertension Paternal Grandmother   . Cancer Paternal Grandfather    History  Substance Use Topics  . Smoking status: Former Smoker -- 0.25 packs/day    Quit date: 10/31/2012  . Smokeless tobacco: Never Used  . Alcohol Use: No   OB History    Gravida Para Term Preterm AB TAB SAB Ectopic Multiple Living   0 1 0 1 0 0 3     Review of Systems Per HPI with all other pertinent systems negative.   Allergies  Review of patient's allergies indicates no known allergies.  Home Medications   Prior to Admission medications   Medication Sig Start Date End Date Taking? Authorizing Provider  Ibuprofen (MOTRIN PO) Take by mouth.   Yes Historical Provider, MD   fluticasone (FLONASE) 50 MCG/ACT nasal spray Place 2 sprays into both nostrils at bedtime. 08/21/14   Ozella Rocks, MD  ipratropium (ATROVENT) 0.06 % nasal spray Place 2 sprays into both nostrils 4 (four) times daily. 08/21/14   Ozella Rocks, MD  ondansetron (ZOFRAN) 4 MG tablet Take 1 tablet (4 mg total) by mouth every 8 (eight) hours as needed for nausea or vomiting. 08/21/14   Ozella Rocks, MD   BP 113/76 mmHg  Pulse 101  Temp(Src) 98.9 F (37.2 C) (Oral)  Resp 16  SpO2 99%  LMP 08/21/2014 Physical Exam Physical Exam  Constitutional: oriented to person, place, and time. appears well-developed and well-nourished. No distress.  HENT:  Head: Normocephalic and atraumatic.  1-2+ tonsils with minimal exudate, minimal pharyngeal cobblestoning. Nasal congestion. Maxillary and frontal sinuses nontender to palpation. No anterior cervical lymphadenopathy. Eyes: EOMI. PERRL.  Neck: Normal range of motion.  Cardiovascular: RRR, no m/r/g, 2+ distal pulses,  Pulmonary/Chest: Effort normal and breath sounds normal. No respiratory distress.  Abdominal: Soft. Bowel sounds are normal. NonTTP, no distension.  Musculoskeletal: Normal range of motion. Non ttp, no effusion.  Neurological: alert and oriented to person, place, and time.  Skin: Skin is warm. No rash noted. non diaphoretic.  Psychiatric: normal mood and affect. behavior is normal. Judgment and thought content normal.   ED Course  Procedures (including critical care time) Labs Review Labs Reviewed  POCT  RAPID STREP A ([facility] ONLY)    Imaging Review No results found.   MDM   1. Sore throat   2. Viral illness   3. Laryngitis    Rapid strep negative, strep culture sent. Prednisone 60 mg administered for laryngitis and overall inflammatory benefit of sore throat. Likely a viral etiology and cannot exclude the flu. Patient is outside the treatment window for Tamiflu. Patient to go home get plenty of rest and drink lots of  fluids continue higher regular dosing of Motrin. Nasal Flonase and Atrovent to be used for nasal congestion and postnasal drip symptoms. Zofran provided for nausea and upset stomach. Will call patient if strep culture is positive for antibiotic therapy.    Ozella Rocksavid J Tyreshia Ingman, MD 08/21/14 1302

## 2014-08-23 LAB — CULTURE, GROUP A STREP

## 2014-08-25 ENCOUNTER — Telehealth (HOSPITAL_COMMUNITY): Payer: Self-pay | Admitting: *Deleted

## 2014-08-25 MED ORDER — AMOXICILLIN 500 MG PO CAPS: 500.0000 mg | ORAL_CAPSULE | Freq: Three times a day (TID) | ORAL | Status: DC

## 2014-08-25 NOTE — ED Notes (Signed)
Throat culture: Strep beta hemolytic not group A.  I called pt. For clinical improvement.  Pt. verified x 2 and given results. Pt. Said she still has sore throat and has a bad laryngitis.  C/o runny nose and ears burning.  Feels worse.  Discussed with Dr. Piedad ClimesHonig and she e-prescribed Amoxicillin to pt.'s pharmacy. Pt. told where to pick up her Rx. Vassie MoselleYork, Zineb Glade M 08/25/2014

## 2014-08-28 ENCOUNTER — Encounter (HOSPITAL_COMMUNITY): Payer: Self-pay | Admitting: Emergency Medicine

## 2014-08-28 ENCOUNTER — Emergency Department (HOSPITAL_COMMUNITY): Payer: Medicaid Other

## 2014-08-28 ENCOUNTER — Emergency Department (HOSPITAL_COMMUNITY)
Admission: EM | Admit: 2014-08-28 | Discharge: 2014-08-28 | Disposition: A | Discharge status: Home / Self Care | Payer: Medicaid Other | Attending: Emergency Medicine | Admitting: Emergency Medicine

## 2014-08-28 DIAGNOSIS — I10 Essential (primary) hypertension: Secondary | ICD-10-CM | POA: Insufficient documentation

## 2014-08-28 DIAGNOSIS — Z8619 Personal history of other infectious and parasitic diseases: Secondary | ICD-10-CM | POA: Insufficient documentation

## 2014-08-28 DIAGNOSIS — Z87440 Personal history of urinary (tract) infections: Secondary | ICD-10-CM | POA: Diagnosis not present

## 2014-08-28 DIAGNOSIS — R0600 Dyspnea, unspecified: Secondary | ICD-10-CM | POA: Diagnosis present

## 2014-08-28 DIAGNOSIS — B349 Viral infection, unspecified: Secondary | ICD-10-CM | POA: Diagnosis not present

## 2014-08-28 DIAGNOSIS — Z79899 Other long term (current) drug therapy: Secondary | ICD-10-CM | POA: Insufficient documentation

## 2014-08-28 DIAGNOSIS — Z87891 Personal history of nicotine dependence: Secondary | ICD-10-CM | POA: Diagnosis not present

## 2014-08-28 NOTE — Discharge Instructions (Signed)

## 2014-08-28 NOTE — ED Provider Notes (Signed)
CSN: [id number]     Arrival date & time 08/28/14  1757 History   First MD Initiated Contact with Patient 08/28/14 1826     Chief Complaint  Patient presents with  . can't catch breath    The history is provided by the patient. No language interpreter was used.   This chart was scribed for nurse practitioner, Teressa Lower, NP working with Pricilla Loveless, MD, by Andrew Au, ED Scribe. This patient was seen in room WTR8/WTR8 and the patient's care was started at 6:29 PM.  Faith [name] is a 24 y.o. female who presents to the Emergency Department complaining of difficulty breathing. Pt states she is having spells of trouble breathing having a hard time catching her breath. She also report the sensation of something lodged in her throat. Pt has associated nasal congestion and burning otalgia.  She notes she had the flu, laryngitis and strep last week and is currently taking amoxicillin for strep that she began 3 days ago . Pt denies fever.  Past Medical History  Diagnosis Date  . Hypertension   . Urinary tract infection   . Chlamydia   . Infection     UTI  . Vaginal Pap smear, abnormal     f/u was ok   Past Surgical History  Procedure Laterality Date  . Cesarean section    . Dilation and evacuation N/A 10/04/2012    Procedure: DILATATION AND EVACUATION;  Surgeon: Lesly Dukes, MD;  Location: [facility];  Service: Gynecology;  Laterality: N/A;   Family History  Problem Relation Age of Onset  . Anesthesia problems Neg Hx   . Hearing loss Neg Hx   . Diabetes Sister   . Hypertension Paternal Grandmother   . Cancer Paternal Grandfather    History  Substance Use Topics  . Smoking status: Former Smoker -- 0.25 packs/day    Quit date: 10/31/2012  . Smokeless tobacco: Never Used  . Alcohol Use: No   OB History    Gravida Para Term Preterm AB TAB SAB Ectopic Multiple Living   0 1 0 1 0 0 3     Review of Systems  Constitutional: Negative for fever and chills.  HENT:  Positive for congestion and ear pain.    Allergies  Review of patient's allergies indicates no known allergies.  Home Medications   Prior to Admission medications   Medication Sig Start Date End Date Taking? Authorizing Provider  amoxicillin (AMOXIL) 500 MG capsule Take 1 capsule (500 mg total) by mouth 3 (three) times daily. 08/25/14   Charm Rings, MD  fluticasone (FLONASE) 50 MCG/ACT nasal spray Place 2 sprays into both nostrils at bedtime. 08/21/14   Ozella Rocks, MD  Ibuprofen (MOTRIN PO) Take by mouth.    Historical Provider, MD  ipratropium (ATROVENT) 0.06 % nasal spray Place 2 sprays into both nostrils 4 (four) times daily. 08/21/14   Ozella Rocks, MD  ondansetron (ZOFRAN) 4 MG tablet Take 1 tablet (4 mg total) by mouth every 8 (eight) hours as needed for nausea or vomiting. 08/21/14   Ozella Rocks, MD   BP 124/58 mmHg  Pulse 102  Temp(Src) 98.1 F (36.7 C) (Oral)  Resp 14  SpO2 100%  LMP 08/21/2014 Physical Exam  Constitutional: She is oriented to person, place, and time. She appears well-developed and well-nourished. No distress.  HENT:  Head: Normocephalic and atraumatic.  Right Ear: External ear normal.  Left Ear: External ear normal.  Mouth/Throat:  Posterior oropharyngeal erythema present.  Eyes: Conjunctivae and EOM are normal.  Neck: Normal range of motion. Neck supple.  Cardiovascular: Normal rate.   Pulmonary/Chest: Effort normal and breath sounds normal. She has no rales.  Abdominal: Soft. There is no tenderness.  Musculoskeletal: Normal range of motion.  Neurological: She is alert and oriented to person, place, and time.  Skin: Skin is warm and dry.  Psychiatric: She has a normal mood and affect. Her behavior is normal.  Nursing note and vitals reviewed.   ED Course  Procedures (including critical care time) DIAGNOSTIC STUDIES: Oxygen Saturation is 100% on RA, normal by my interpretation.    COORDINATION OF CARE: 6:39 PM- Pt advised of plan for  treatment and pt agrees.  Labs Review Labs Reviewed - No data to display  Imaging Review Dg Chest 2 View  08/28/2014   CLINICAL DATA:  Shortness of breath and cough. Fluid and strap last week.  EXAM: CHEST  2 VIEW  COMPARISON:  06/18/2009  FINDINGS: The heart size and mediastinal contours are within normal limits. Both lungs are clear. Subtle curvature of the thoracic spine convex right which may be partly positional nature.  IMPRESSION: No active cardiopulmonary disease.   Electronically Signed   By: Elberta Fortisaniel  Boyle M.D.   On: 08/28/2014 19:29     EKG Interpretation None      MDM   Final diagnoses:  Viral illness    No sign of pneumonia on x-ray. Pt is okay to follow up with pcp as needed.  I personally performed the services described in this documentation, which was scribed in my presence. The recorded information has been reviewed and is accurate.    Teressa LowerVrinda Sloan Takagi, NP 08/28/14 1958  Pricilla LovelessScott Goldston, MD 08/29/14 [phone]

## 2014-08-28 NOTE — ED Notes (Signed)
Per pt, on antibiotics for strep-states she feels like she cant catch her breath at times-has a lot of sinus pressure

## 2014-10-06 ENCOUNTER — Emergency Department (HOSPITAL_COMMUNITY)
Admission: EM | Admit: 2014-10-06 | Discharge: 2014-10-06 | Disposition: A | Discharge status: Home / Self Care | Payer: Medicaid Other | Source: Home / Self Care | Attending: Family Medicine | Admitting: Family Medicine

## 2014-10-06 ENCOUNTER — Encounter (HOSPITAL_COMMUNITY): Payer: Self-pay | Admitting: Emergency Medicine

## 2014-10-06 DIAGNOSIS — J020 Streptococcal pharyngitis: Secondary | ICD-10-CM

## 2014-10-06 LAB — POCT RAPID STREP A: STREPTOCOCCUS, GROUP A SCREEN (DIRECT): POSITIVE — AB

## 2014-10-06 MED ORDER — CEFDINIR 300 MG PO CAPS: 300.0000 mg | ORAL_CAPSULE | Freq: Two times a day (BID) | ORAL | Status: DC

## 2014-10-06 NOTE — ED Notes (Signed)
Pt has been suffering from a sore throat, ears burning, and generalized body aches since Tuesday.  She has tried Ibuprofen for the pain, but it has not helped.

## 2014-10-06 NOTE — Discharge Instructions (Signed)
Drink lots of fluids, take all of medicine, use lozenges as needed.return if needed °

## 2014-10-06 NOTE — ED Provider Notes (Signed)
CSN: [id number]     Arrival date & time 10/06/14  6578 History   First MD Initiated Contact with Patient 10/06/14 1058     Chief Complaint  Patient presents with  . Sore Throat  . Otalgia  . Generalized Body Aches   (Consider location/radiation/quality/duration/timing/severity/associated sxs/prior Treatment) Patient is a 24 y.o. female presenting with pharyngitis and ear pain. The history is provided by the patient.  Sore Throat This is a recurrent problem. The current episode started more than 2 days ago. The problem has been gradually worsening. The symptoms are aggravated by swallowing.  Otalgia Associated symptoms: fever and sore throat     Past Medical History  Diagnosis Date  . Hypertension   . Urinary tract infection   . Chlamydia   . Infection     UTI  . Vaginal Pap smear, abnormal     f/u was ok   Past Surgical History  Procedure Laterality Date  . Cesarean section    . Dilation and evacuation N/A 10/04/2012    Procedure: DILATATION AND EVACUATION;  Surgeon: Lesly Dukes, MD;  Location: [facility];  Service: Gynecology;  Laterality: N/A;   Family History  Problem Relation Age of Onset  . Anesthesia problems Neg Hx   . Hearing loss Neg Hx   . Diabetes Sister   . Hypertension Paternal Grandmother   . Cancer Paternal Grandfather    History  Substance Use Topics  . Smoking status: Former Smoker -- 0.25 packs/day    Quit date: 10/31/2012  . Smokeless tobacco: Never Used  . Alcohol Use: No   OB History    Gravida Para Term Preterm AB TAB SAB Ectopic Multiple Living   0 1 0 1 0 0 3     Review of Systems  Constitutional: Positive for fever and chills.  HENT: Positive for ear pain and sore throat.   Musculoskeletal: Positive for myalgias.  Hematological: Positive for adenopathy.    Allergies  Review of patient's allergies indicates no known allergies.  Home Medications   Prior to Admission medications   Medication Sig Start Date End Date Taking?  Authorizing Provider  Ibuprofen (MOTRIN PO) Take by mouth.   Yes Historical Provider, MD  amoxicillin (AMOXIL) 500 MG capsule Take 1 capsule (500 mg total) by mouth 3 (three) times daily. 08/25/14   Charm Rings, MD  cefdinir (OMNICEF) 300 MG capsule Take 1 capsule (300 mg total) by mouth 2 (two) times daily. 10/06/14   Linna Hoff, MD  fluticasone (FLONASE) 50 MCG/ACT nasal spray Place 2 sprays into both nostrils at bedtime. 08/21/14   Ozella Rocks, MD  ipratropium (ATROVENT) 0.06 % nasal spray Place 2 sprays into both nostrils 4 (four) times daily. 08/21/14   Ozella Rocks, MD  ondansetron (ZOFRAN) 4 MG tablet Take 1 tablet (4 mg total) by mouth every 8 (eight) hours as needed for nausea or vomiting. 08/21/14   Ozella Rocks, MD   BP 100/70 mmHg  Pulse 107  Temp(Src) 99.3 F (37.4 C) (Oral)  SpO2 99%  LMP 09/12/2014 (Approximate) Physical Exam  Constitutional: She is oriented to person, place, and time. She appears well-developed and well-nourished. She appears distressed.  HENT:  Right Ear: External ear normal.  Left Ear: External ear normal.  Mouth/Throat: Mucous membranes are normal. Oropharyngeal exudate and posterior oropharyngeal erythema present.  Eyes: Conjunctivae are normal. Pupils are equal, round, and reactive to light.  Neck: Normal range of motion. Neck supple.  Cardiovascular: Regular rhythm and normal heart sounds.   Pulmonary/Chest: Effort normal and breath sounds normal.  Abdominal: Soft. Bowel sounds are normal.  Lymphadenopathy:    She has cervical adenopathy.  Neurological: She is alert and oriented to person, place, and time.  Skin: Skin is warm and dry.  Nursing note and vitals reviewed.   ED Course  Procedures (including critical care time) Labs Review Labs Reviewed  POCT RAPID STREP A - Abnormal; Notable for the following:    Streptococcus, Group A Screen (Direct) POSITIVE (*)    All other components within normal limits    Imaging Review No  results found.   MDM   1. Streptococcal sore throat        Linna HoffJames D Tarrie Mcmichen, MD 10/06/14 1125

## 2014-10-28 IMAGING — US US OB TRANSVAGINAL
1 series · 13 of 25 positions shown · non-contrast
Comparison: none

[Series 1: us ob transvaginal · 25 acquisitions, 13 frames shown]
[im 1/25]
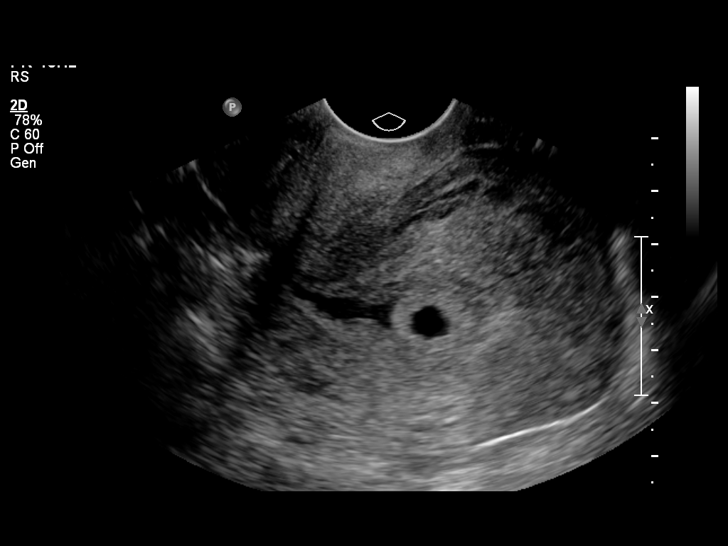
[im 3/25]
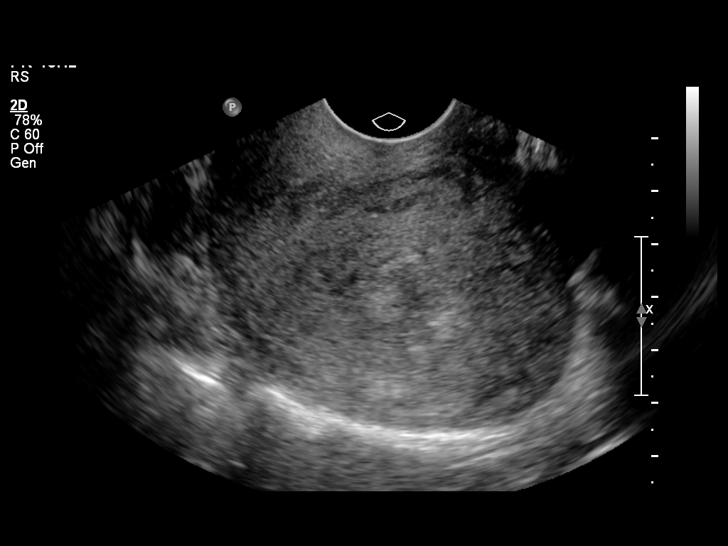
[im 5/25]
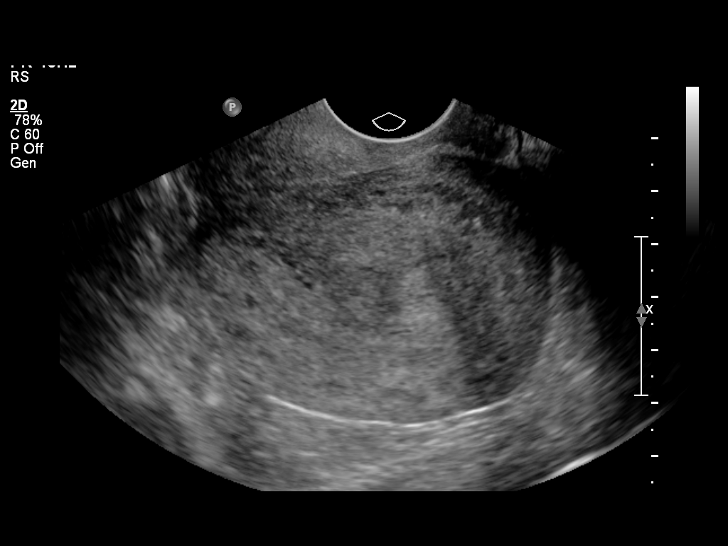
[im 7/25]
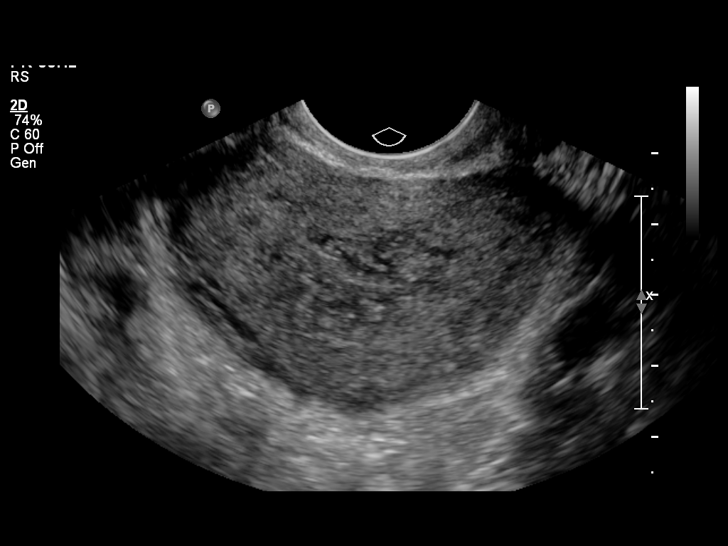
[im 9/25]
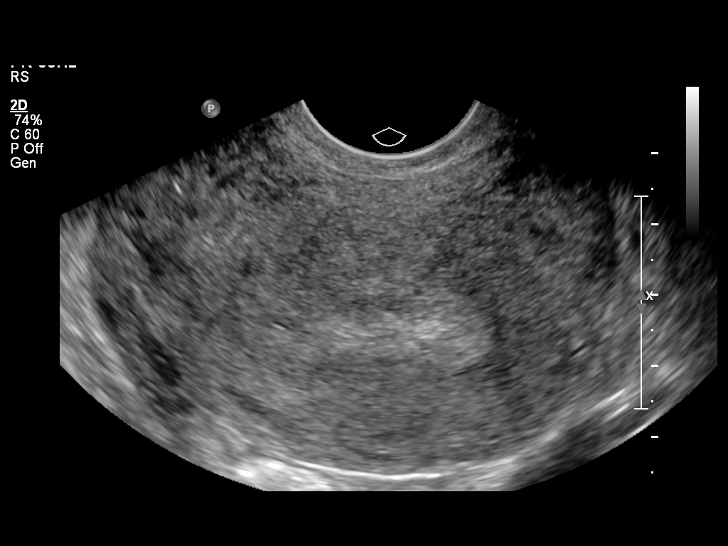
[im 11/25]
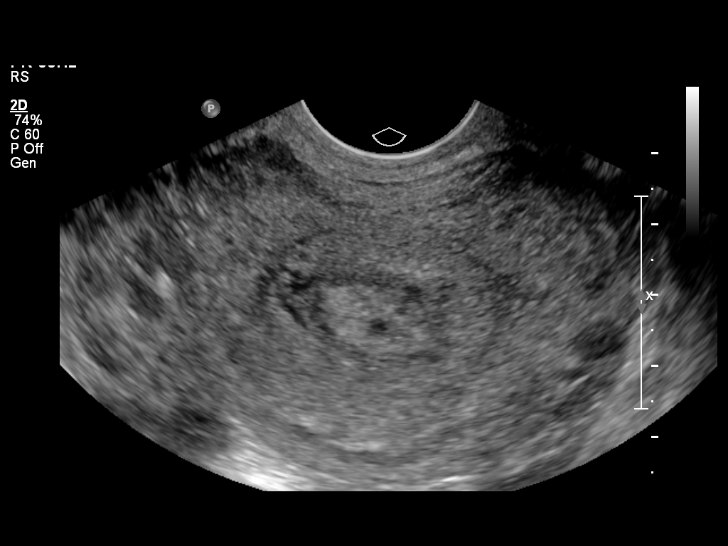
[im 13/25]
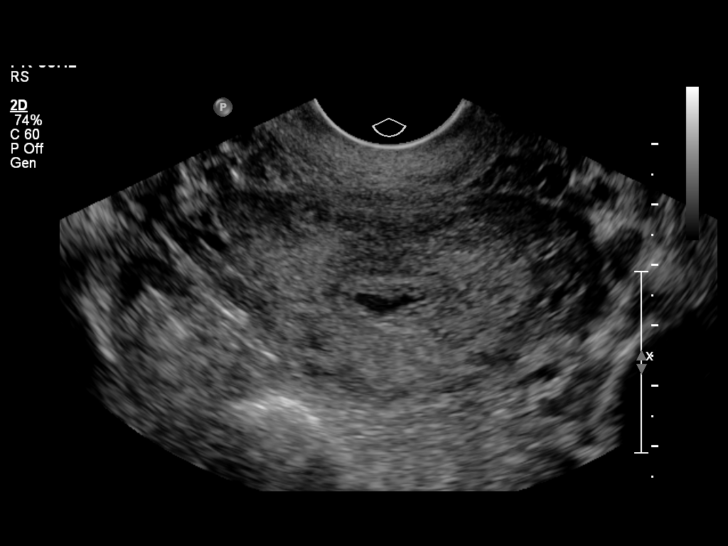
[im 15/25]
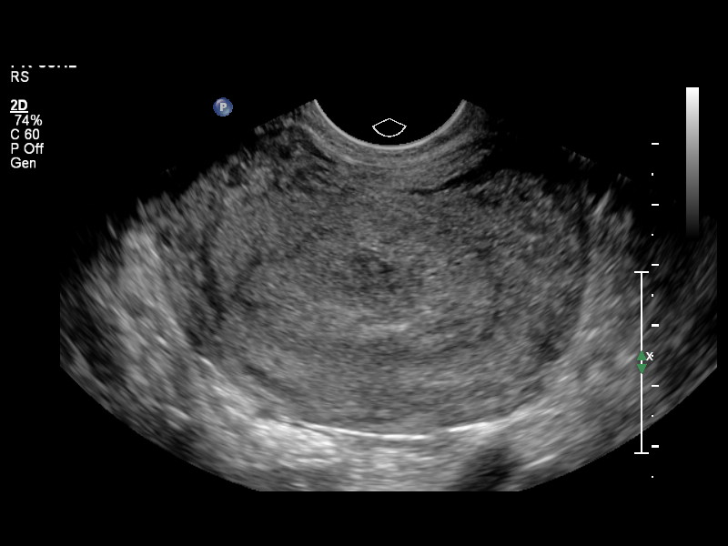
[im 17/25]
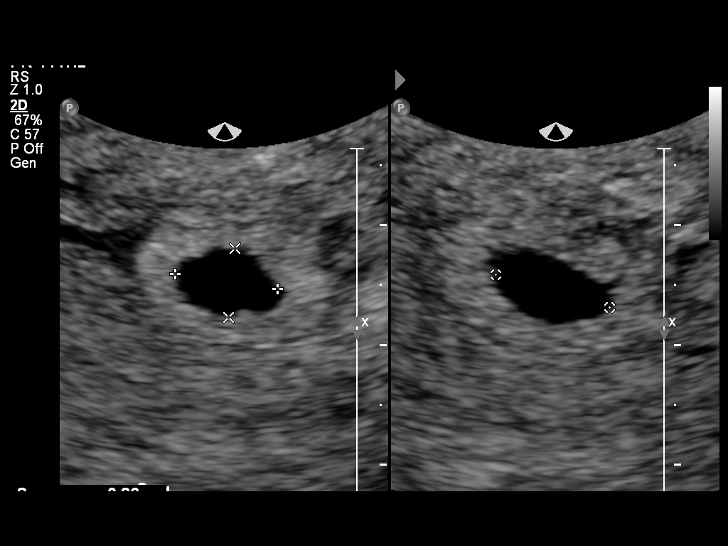
[im 19/25]
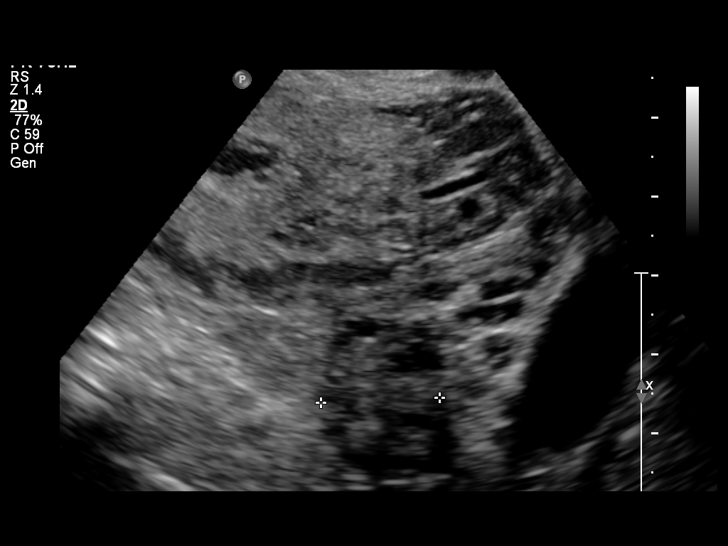
[im 21/25]
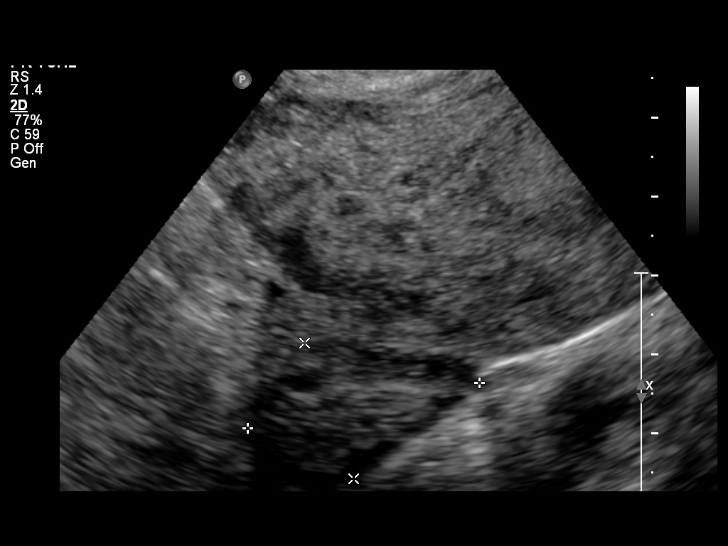
[im 23/25]
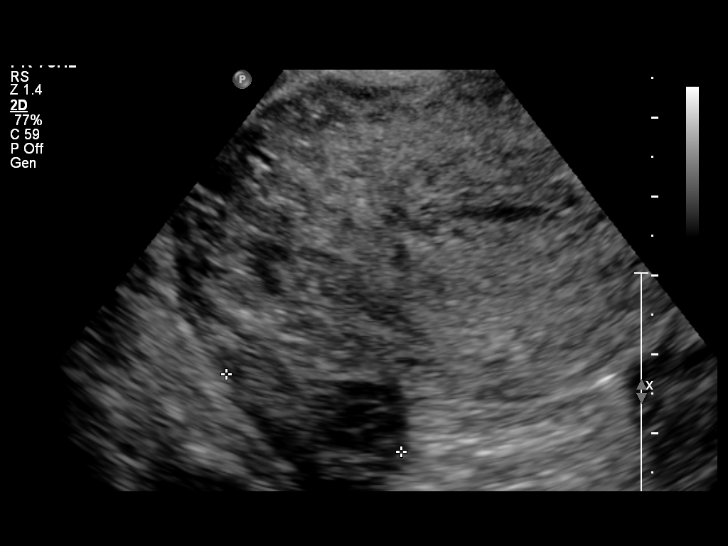
[im 25/25]
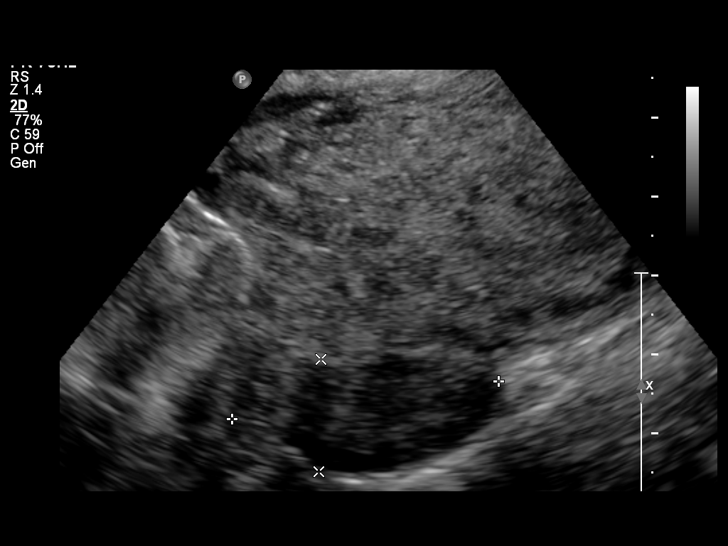

[13 of 25 positions shown; findings below may reference images not displayed]

OBSTETRICS REPORT
                      (Signed Final 08/18/2012 [DATE])

             SINVAL

Service(s) Provided

 US OB TRANSVAGINAL                                    76817.0
Indications

 Pregnancy with inconclusive fetal viability
 Size less than dates (Small for gestational [AGE]
 FGR)
Fetal Evaluation

 Num Of Fetuses:    1
 Gest. Sac:         Intrauterine, small
                    subchorionic bleed
 Yolk Sac:          Not visualized
 Fetal Pole:        Not visualized
 Cardiac Activity:  Not visualized
Biometry

 GS:       8.1  mm    G. Age:   5w 3d                  EDD:   04/17/13
Gestational Age

 LMP:           7w 5d        Date:   06/25/12                 EDD:   04/01/13
 Best:          7w 5d     Det. By:   LMP  (06/25/12)          EDD:   04/01/13
Cervix Uterus Adnexa

 Cervix:       Normal appearance by transabdominal scan.
 Uterus:       No abnormality visualized.
 Cul De Sac:   No free fluid seen.

 Left Ovary:   Size(cm) L: 2.99 x W: 1.51 x H: 1.83  Volume(cc):
               Within normal limits.
 Right Ovary:  Size(cm) L: 3.41 x W: 2.42 x H: 1.42  Volume(cc):
               Small corpus luteum noted.
 Adnexa:     No abnormality visualized.
Impression

 Failed early intrauterine pregnancy.   Compared to the
 ultrasound of 08/06/2012, the intrauterine gestational sac has
 not shown appropriate growth and no yolk sac or embryo are
 present (we would expect to see them at this time).
 Small subchorionic hemorrhage.

## 2015-02-20 ENCOUNTER — Encounter (HOSPITAL_COMMUNITY): Payer: Self-pay | Admitting: Emergency Medicine

## 2015-02-20 ENCOUNTER — Emergency Department (HOSPITAL_COMMUNITY)
Admission: EM | Admit: 2015-02-20 | Discharge: 2015-02-20 | Disposition: A | Discharge status: Home / Self Care | Payer: Medicaid Other | Attending: Emergency Medicine | Admitting: Emergency Medicine

## 2015-02-20 ENCOUNTER — Emergency Department (HOSPITAL_COMMUNITY): Payer: Medicaid Other

## 2015-02-20 DIAGNOSIS — R110 Nausea: Secondary | ICD-10-CM | POA: Diagnosis present

## 2015-02-20 DIAGNOSIS — R2240 Localized swelling, mass and lump, unspecified lower limb: Secondary | ICD-10-CM | POA: Diagnosis not present

## 2015-02-20 DIAGNOSIS — R079 Chest pain, unspecified: Secondary | ICD-10-CM | POA: Diagnosis not present

## 2015-02-20 DIAGNOSIS — L02412 Cutaneous abscess of left axilla: Secondary | ICD-10-CM | POA: Diagnosis not present

## 2015-02-20 DIAGNOSIS — R42 Dizziness and giddiness: Secondary | ICD-10-CM | POA: Insufficient documentation

## 2015-02-20 DIAGNOSIS — Z720 Tobacco use: Secondary | ICD-10-CM | POA: Insufficient documentation

## 2015-02-20 DIAGNOSIS — Z87440 Personal history of urinary (tract) infections: Secondary | ICD-10-CM | POA: Insufficient documentation

## 2015-02-20 DIAGNOSIS — I10 Essential (primary) hypertension: Secondary | ICD-10-CM | POA: Insufficient documentation

## 2015-02-20 DIAGNOSIS — R0602 Shortness of breath: Secondary | ICD-10-CM | POA: Insufficient documentation

## 2015-02-20 DIAGNOSIS — N938 Other specified abnormal uterine and vaginal bleeding: Secondary | ICD-10-CM | POA: Diagnosis not present

## 2015-02-20 DIAGNOSIS — Z8619 Personal history of other infectious and parasitic diseases: Secondary | ICD-10-CM | POA: Diagnosis not present

## 2015-02-20 LAB — COMPREHENSIVE METABOLIC PANEL
ALBUMIN: 3.5 g/dL (ref 3.5–5.0)
ALT: 16 U/L (ref 14–54)
ANION GAP: 8 (ref 5–15)
AST: 15 U/L (ref 15–41)
Alkaline Phosphatase: 52 U/L (ref 38–126)
BUN: 5 mg/dL — ABNORMAL LOW (ref 6–20)
CALCIUM: 9.3 mg/dL (ref 8.9–10.3)
CO2: 26 mmol/L (ref 22–32)
Chloride: 108 mmol/L (ref 101–111)
Creatinine, Ser: 0.62 mg/dL (ref 0.44–1.00)
GFR calc non Af Amer: >60 mL/min (ref >60)
Glucose, Bld: 98 mg/dL (ref 65–99)
POTASSIUM: 3.7 mmol/L (ref 3.5–5.1)
Sodium: 142 mmol/L (ref 135–145)
Total Bilirubin: 0.3 mg/dL (ref 0.3–1.2)
Total Protein: 6.7 g/dL (ref 6.5–8.1)

## 2015-02-20 LAB — I-STAT TROPONIN, ED: Troponin i, poc: 0 ng/mL (ref 0.00–0.08)

## 2015-02-20 LAB — CBC
HEMATOCRIT: 36.5 % (ref 36.0–46.0)
HEMOGLOBIN: 11.4 g/dL — AB (ref 12.0–15.0)
MCH: 26 pg (ref 26.0–34.0)
MCHC: 31.2 g/dL (ref 30.0–36.0)
MCV: 83.3 fL (ref 78.0–100.0)
Platelets: 296 10*3/uL (ref 150–400)
RBC: 4.38 MIL/uL (ref 3.87–5.11)
RDW: 14.6 % (ref 11.5–15.5)
WBC: 7.3 10*3/uL (ref 4.0–10.5)

## 2015-02-20 LAB — I-STAT BETA HCG BLOOD, ED (MC, WL, AP ONLY)

## 2015-02-20 MED ORDER — DOXYCYCLINE HYCLATE 100 MG PO CAPS: 100.0000 mg | ORAL_CAPSULE | Freq: Two times a day (BID) | ORAL | Status: DC

## 2015-02-20 NOTE — ED Provider Notes (Signed)
CSN: [id number]     Arrival date & time 02/20/15  1131 History   First MD Initiated Contact with Patient 02/20/15 1252     Chief Complaint  Patient presents with  . Dizziness  . Nausea     (Consider location/radiation/quality/duration/timing/severity/associated sxs/prior Treatment) HPI Faith [name] is a 24 y.o. female with history of hypertension, UTIs, presents to emergency department complaining of chest pain, shortness of breath, abscess to the left axilla. Patient states that she has had pain to the left axilla for approximately week. She states nausea, dizziness, shortness of breath started 3 days ago, and chest pain started yesterday. Patient states the chest pain feels like pressure, states radiates to the right arm. Pt denies pregnancy, states currently on period. Admit to taking a lot of ibuprofen for headaches. Denies fever or chills. No neck pain or stiffness. No URI symptoms or cough. No Urinary symptoms.   Past Medical History  Diagnosis Date  . Hypertension   . Urinary tract infection   . Chlamydia   . Infection     UTI  . Vaginal Pap smear, abnormal     f/u was ok   Past Surgical History  Procedure Laterality Date  . Cesarean section    . Dilation and evacuation N/A 10/04/2012    Procedure: DILATATION AND EVACUATION;  Surgeon: Lesly Dukes, MD;  Location: [facility];  Service: Gynecology;  Laterality: N/A;   Family History  Problem Relation Age of Onset  . Anesthesia problems Neg Hx   . Hearing loss Neg Hx   . Diabetes Sister   . Hypertension Paternal Grandmother   . Cancer Paternal Grandfather    Social History  Substance Use Topics  . Smoking status: Current Every Day Smoker -- 0.25 packs/day    Last Attempt to Quit: 10/31/2012  . Smokeless tobacco: Never Used  . Alcohol Use: Yes     Comment: occ   OB History    Gravida Para Term Preterm AB TAB SAB Ectopic Multiple Living   0 1 0 1 0 0 3     Review of Systems  Constitutional: Negative  for fever and chills.  Respiratory: Positive for chest tightness and shortness of breath. Negative for cough.   Cardiovascular: Positive for chest pain and leg swelling. Negative for palpitations.  Gastrointestinal: Positive for nausea. Negative for vomiting, abdominal pain and diarrhea.  Genitourinary: Positive for vaginal bleeding.  Musculoskeletal: Negative for myalgias, arthralgias, neck pain and neck stiffness.  Skin: Positive for wound. Negative for rash.  Neurological: Negative for dizziness, weakness and headaches.  All other systems reviewed and are negative.     Allergies  Review of patient's allergies indicates no known allergies.  Home Medications   Prior to Admission medications   Medication Sig Start Date End Date Taking? Authorizing Provider  ibuprofen (ADVIL,MOTRIN) 200 MG tablet Take 200 mg by mouth every 6 (six) hours as needed for moderate pain.   Yes Historical Provider, MD  amoxicillin (AMOXIL) 500 MG capsule Take 1 capsule (500 mg total) by mouth 3 (three) times daily. Patient not taking: Reported on 02/20/2015 08/25/14   Charm Rings, MD  cefdinir (OMNICEF) 300 MG capsule Take 1 capsule (300 mg total) by mouth 2 (two) times daily. Patient not taking: Reported on 02/20/2015 10/06/14   Linna Hoff, MD  fluticasone [facility]) 50 MCG/ACT nasal spray Place 2 sprays into both nostrils at bedtime. Patient not taking: Reported on 02/20/2015 08/21/14   Ozella Rocks,  MD  ipratropium (ATROVENT) 0.06 % nasal spray Place 2 sprays into both nostrils 4 (four) times daily. Patient not taking: Reported on 02/20/2015 08/21/14   Ozella Rocks, MD  ondansetron (ZOFRAN) 4 MG tablet Take 1 tablet (4 mg total) by mouth every 8 (eight) hours as needed for nausea or vomiting. Patient not taking: Reported on 02/20/2015 08/21/14   Ozella Rocks, MD   BP 120/66 mmHg  Pulse 81  Temp(Src) 97.9 F (36.6 C) (Oral)  Resp 18  SpO2 98% Physical Exam  Constitutional: She is oriented to  person, place, and time. She appears well-developed and well-nourished. No distress.  HENT:  Head: Normocephalic.  Eyes: Conjunctivae are normal.  Neck: Neck supple.  Cardiovascular: Normal rate, regular rhythm and normal heart sounds.   Pulmonary/Chest: Effort normal and breath sounds normal. No respiratory distress. She has no wheezes. She has no rales.  Abdominal: Soft. Bowel sounds are normal. She exhibits no distension. There is no tenderness. There is no rebound.  Musculoskeletal: She exhibits edema.  Trace bilateral lower extremity edema  Neurological: She is alert and oriented to person, place, and time.  Skin: Skin is warm and dry.  2 abscesses to the left axilla, tender to palpation, no surrounding skin changes. No fluctuance. No drainage.  Psychiatric: She has a normal mood and affect. Her behavior is normal.  Nursing note and vitals reviewed.   ED Course  Procedures (including critical care time) Labs Review Labs Reviewed  COMPREHENSIVE METABOLIC PANEL - Abnormal; Notable for the following:    BUN 5 (*)    All other components within normal limits  CBC - Abnormal; Notable for the following:    Hemoglobin 11.4 (*)    All other components within normal limits  I-STAT BETA HCG BLOOD, ED ([facility], [facility], [facility] ONLY)  I-STAT TROPOININ, ED    Imaging Review Dg Chest 2 View  02/20/2015  CLINICAL DATA:  Acute chest pain and shortness of breath. EXAM: CHEST  2 VIEW COMPARISON:  August 28, 2014. FINDINGS: The heart size and mediastinal contours are within normal limits. Both lungs are clear. No pneumothorax or pleural effusion is noted. The visualized skeletal structures are unremarkable. IMPRESSION: No active cardiopulmonary disease. Electronically Signed   By: Lupita Raider, M.D.   On: 02/20/2015 14:14   I have personally reviewed and evaluated these images and lab results as part of my medical decision-making.  ED ECG REPORT   Date: 02/20/2015  Rate: 69  Rhythm: normal sinus  rhythm  QRS Axis: normal  Intervals: normal  ST/T Wave abnormalities: normal  Conduction Disutrbances:none  Narrative Interpretation:   Old EKG Reviewed: unchanged  I have personally reviewed the EKG tracing and agree with the computerized printout as noted.   MDM   Final diagnoses:  Abscess of left axilla  Chest pain, unspecified chest pain type    patient with nonspecific dizziness, nausea, chest pain yesterday, currently complaining of left axillary pain. Patient does have small abscesses to the left axilla, this time with no drainage or fluctuance. Will start on antibiotics, warm compresses at home. Will also check labs, chest x-ray, troponin.  Patient's labs unremarkable. Her chest pain is atypical. Doubt ACS. Normal vital signs, doubt PE. Patient's main complaint is pain to the left axilla. Exam consistent with small abscesses. Will start on doxycycline, I do not think she needs I and D of this time. Will discharge home with follow-up if chest pain continues in for recheck of her abscesses.  Filed Vitals:   02/20/15 1142 02/20/15 1326 02/20/15 1330 02/20/15 1522  BP: 120/66 120/51 113/48 119/73  Pulse: 81 77 73 69  Temp: 97.9 F (36.6 C)   98.6 F (37 C)  TempSrc: Oral   Oral  Resp: 18 12 14 18   SpO2: 98% 100% 100% 99%        Jaynie Crumbleatyana Candace Ramus, PA-C 02/20/15 1655  Leta BaptistEmily Roe Nguyen, MD 02/20/15 2152

## 2015-02-20 NOTE — Discharge Instructions (Signed)
Warm compresses to the axilla several times a day. Doxycycline as prescribed until all gone. Follow up with primary care doctor. Return if worsening.    Chest Pain Observation It is often hard to give a specific diagnosis for the cause of chest pain. Among other possibilities your symptoms might be caused by inadequate oxygen delivery to your heart (angina). Angina that is not treated or evaluated can lead to a heart attack (myocardial infarction) or death. Blood tests, electrocardiograms, and X-rays may have been done to help determine a possible cause of your chest pain. After evaluation and observation, your health care provider has determined that it is unlikely your pain was caused by an unstable condition that requires hospitalization. However, a full evaluation of your pain may need to be completed, with additional diagnostic testing as directed. It is very important to keep your follow-up appointments. Not keeping your follow-up appointments could result in permanent heart damage, disability, or death. If there is any problem keeping your follow-up appointments, you must call your health care provider. HOME CARE INSTRUCTIONS  Due to the slight chance that your pain could be angina, it is important to follow your health care provider's treatment plan and also maintain a healthy lifestyle:  Maintain or work toward achieving a healthy weight.  Stay physically active and exercise regularly.  Decrease your salt intake.  Eat a balanced, healthy diet. Talk to a dietitian to learn about heart-healthy foods.  Increase your fiber intake by including whole grains, vegetables, fruits, and nuts in your diet.  Avoid situations that cause stress, anger, or depression.  Take medicines as advised by your health care provider. Report any side effects to your health care provider. Do not stop medicines or adjust the dosages on your own.  Quit smoking. Do not use nicotine patches or gum until you check  with your health care provider.  Keep your blood pressure, blood sugar, and cholesterol levels within normal limits.  Limit alcohol intake to no more than 1 drink per day for women who are not pregnant and 2 drinks per day for men.  Do not abuse drugs. SEEK IMMEDIATE MEDICAL CARE IF: You have severe chest pain or pressure which may include symptoms such as:  You feel pain or pressure in your arms, neck, jaw, or back.  You have severe back or abdominal pain, feel sick to your stomach (nauseous), or throw up (vomit).  You are sweating profusely.  You are having a fast or irregular heartbeat.  You feel short of breath while at rest.  You notice increasing shortness of breath during rest, sleep, or with activity.  You have chest pain that does not get better after rest or after taking your usual medicine.  You wake from sleep with chest pain.  You are unable to sleep because you cannot breathe.  You develop a frequent cough or you are coughing up blood.  You feel dizzy, faint, or experience extreme fatigue.  You develop severe weakness, dizziness, fainting, or chills. Any of these symptoms may represent a serious problem that is an emergency. Do not wait to see if the symptoms will go away. Call your local emergency services (911 in the [place] Do not drive yourself to the hospital. MAKE SURE YOU:  Understand these instructions.  Will watch your condition.  Will get help right away if you are not doing well or get worse.   This information is not intended to replace advice given to you by your health care provider.  Make sure you discuss any questions you have with your health care provider.   Document Released: 05/24/2010 Document Revised: 04/26/2013 Document Reviewed: 10/21/2012 Elsevier Interactive Patient Education [employer]2016 [employer] Inc.

## 2015-02-20 NOTE — ED Notes (Signed)
Pt sts some dizziness and nausea x 3 days; pt sts some pain in right arm; pt sts currently on period

## 2015-03-16 IMAGING — US US OB COMP LESS 14 WK
1 series · 12 of 12 positions shown · non-contrast
Comparison: Pelvic ultrasound 10/04/2012

CLINICAL DATA: Cramping.  Estimated gestational age by LMP is 13
weeks 1 day, but LMP is reportedly uncertain.

OBSTETRIC <14 WK ULTRASOUND
TECHNIQUE: Transabdominal ultrasound was performed for evaluation
of the gestation as well as the maternal uterus and adnexal
regions.

[Series 1: us ob comp less 14 wks · 12 acquisitions, 12 frames shown]
[im 1/12]
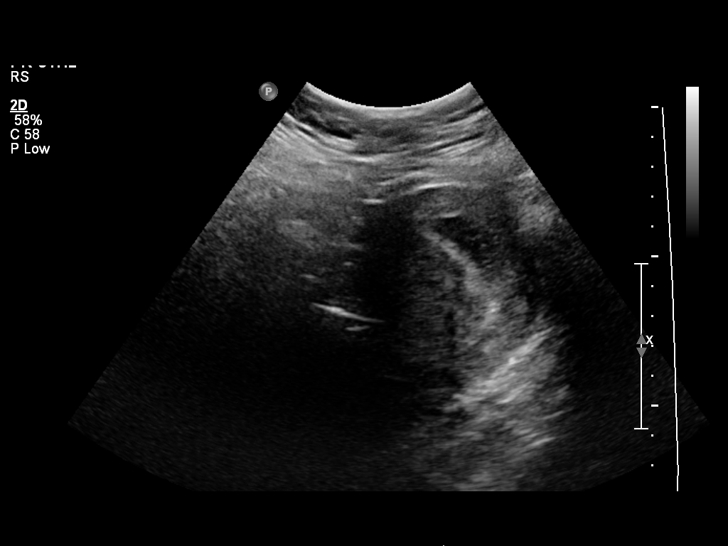
[im 2/12]
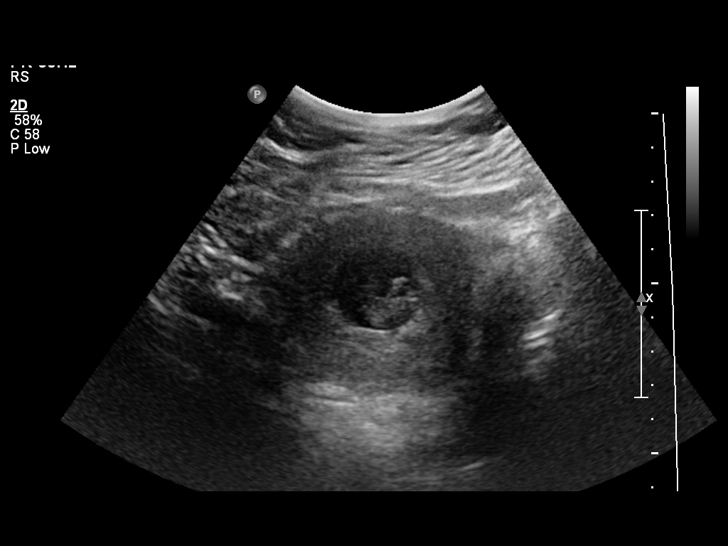
[im 3/12]
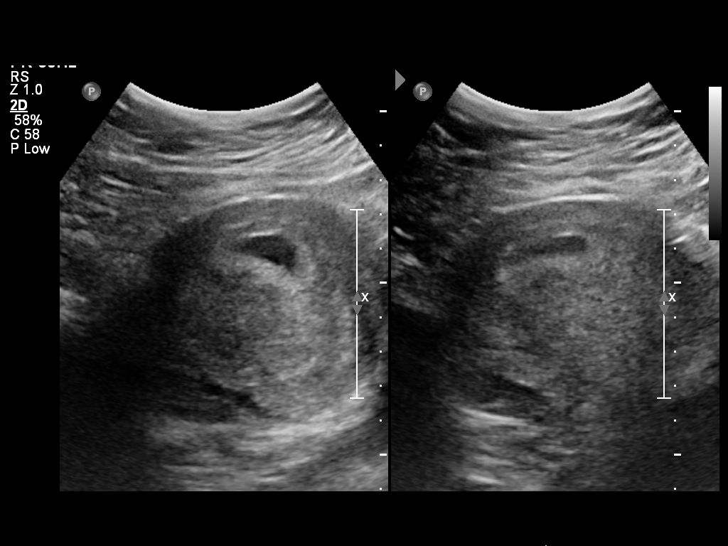
[im 4/12]
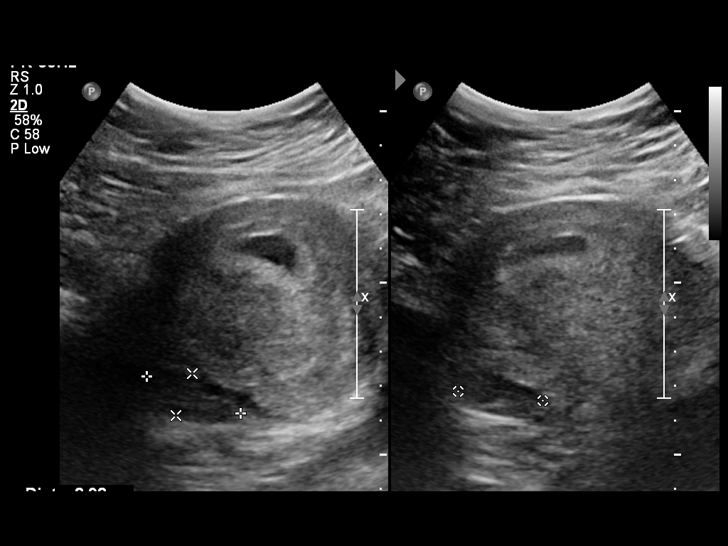
[im 5/12]
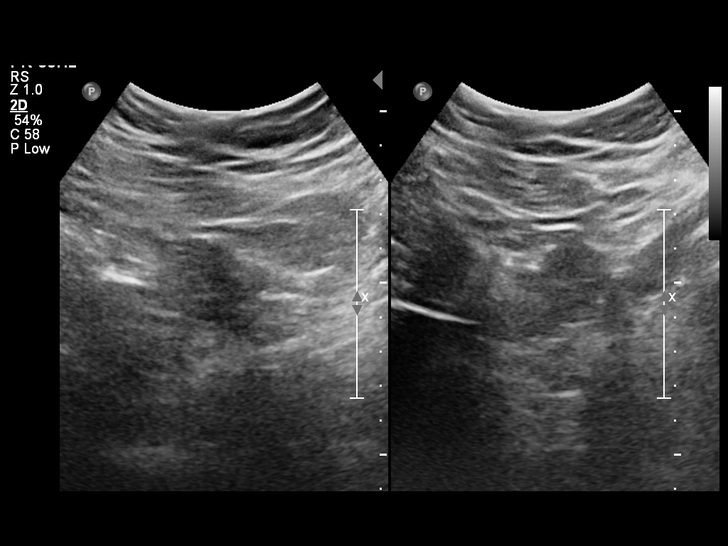
[im 6/12]
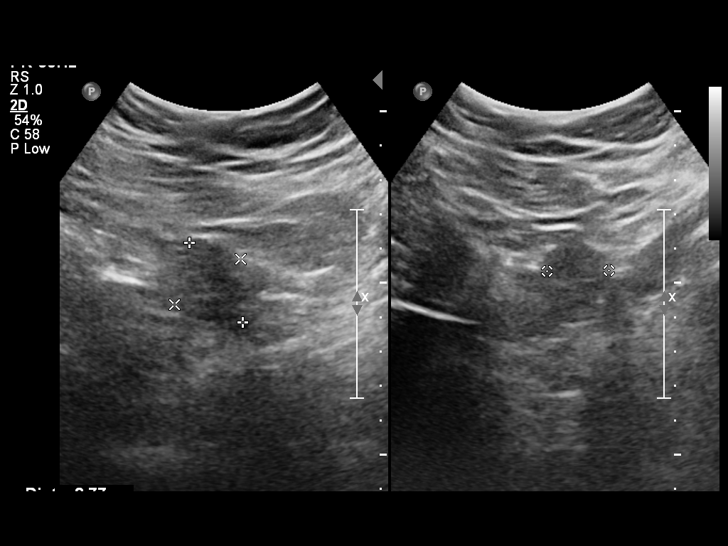
[im 7/12]
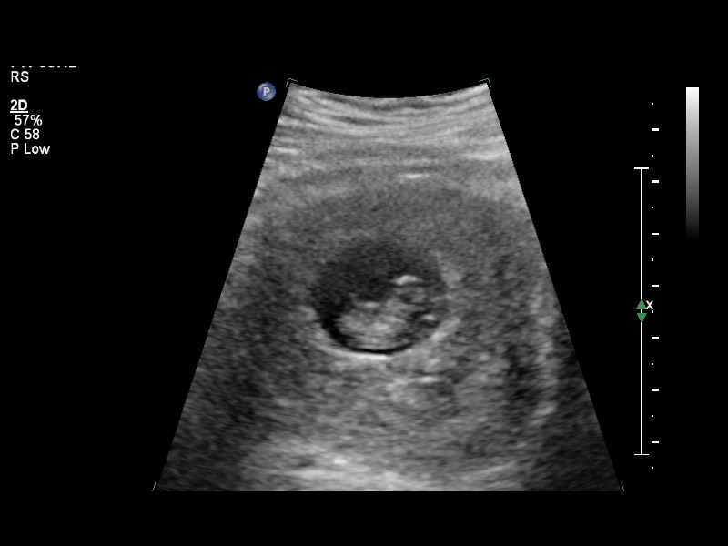
[im 8/12]
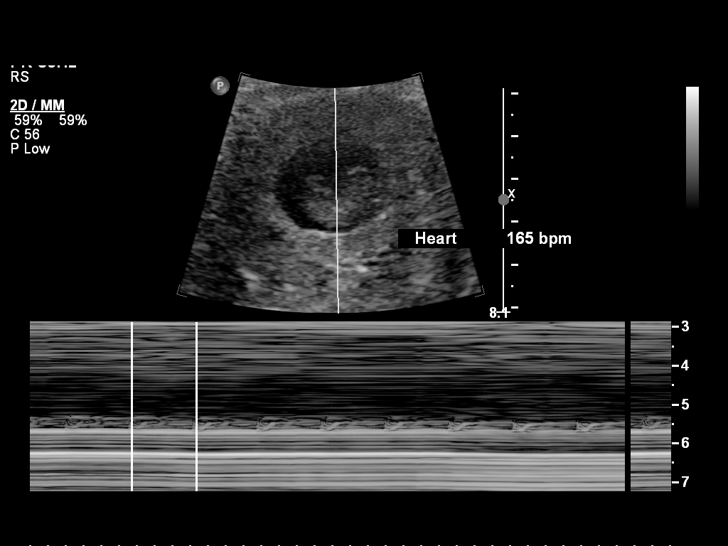
[im 9/12]
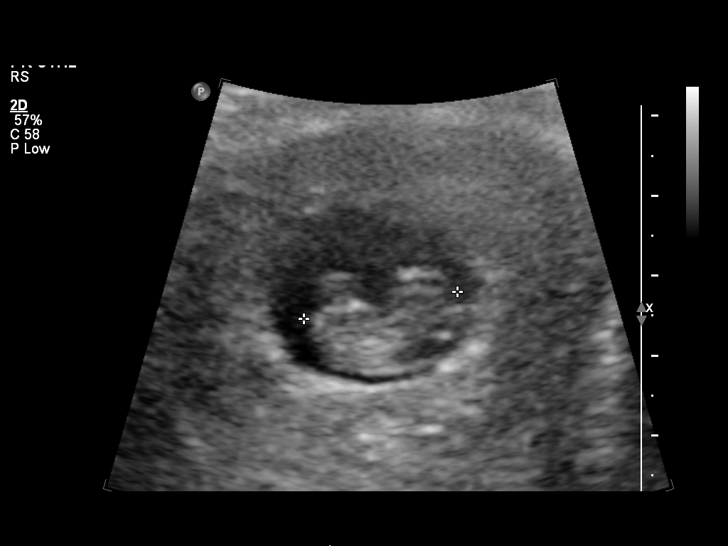
[im 10/12]
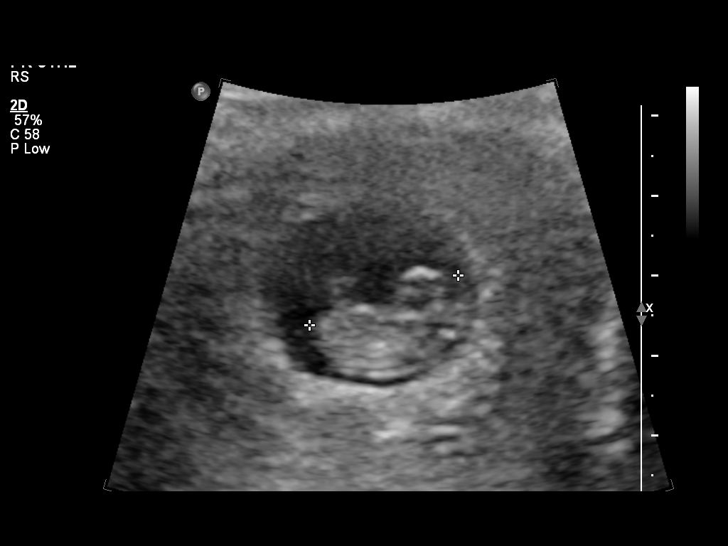
[im 11/12]
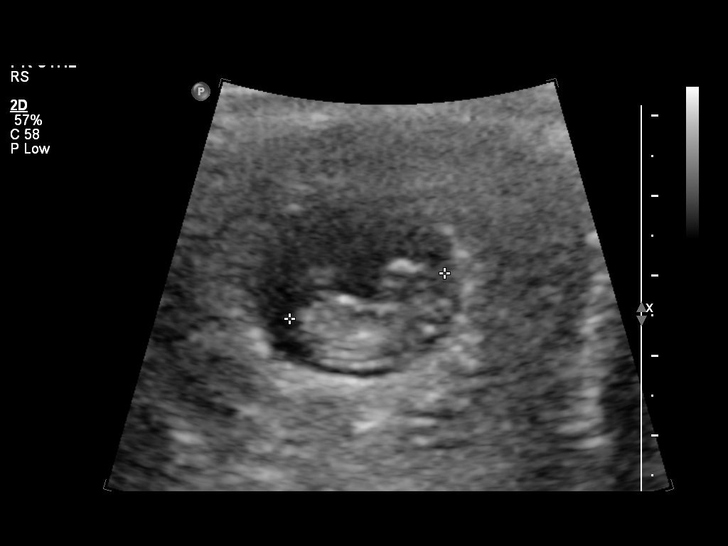
[im 12/12]
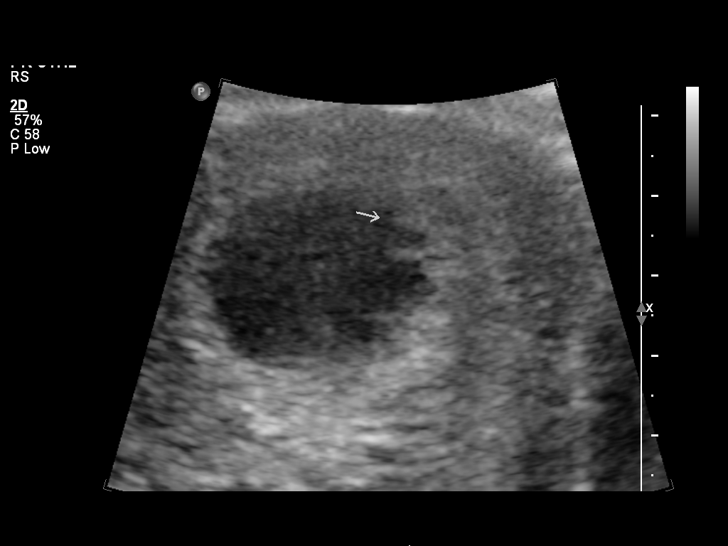

[12 of 12 positions shown; findings below may reference images not displayed]

Intrauterine gestational sac: Visualized
Yolk sac: Visualized
Embryo: Visualized
Cardiac Activity: Visualized
Heart Rate: 165 bpm

CRL:  1.98 mm  8 w  4 d       US EDC: 08/12/2013

Maternal uterus/Adnexae:
The left and right maternal ovaries have normal appearances.  No
adnexal mass is identified.  Negative for subchorionic hemorrhage.
No free pelvic fluid is identified.
IMPRESSION: Single living intrauterine pregnancy.  Estimated gestational age by
crown-rump length is 8 weeks 4 days with an EDC of 08/12/2013.

age is recommended.

## 2015-03-27 ENCOUNTER — Encounter (HOSPITAL_COMMUNITY): Payer: Self-pay | Admitting: Emergency Medicine

## 2015-03-27 DIAGNOSIS — N63 Unspecified lump in breast: Secondary | ICD-10-CM | POA: Diagnosis present

## 2015-03-27 DIAGNOSIS — F17200 Nicotine dependence, unspecified, uncomplicated: Secondary | ICD-10-CM | POA: Insufficient documentation

## 2015-03-27 DIAGNOSIS — Z87440 Personal history of urinary (tract) infections: Secondary | ICD-10-CM | POA: Diagnosis not present

## 2015-03-27 DIAGNOSIS — Z792 Long term (current) use of antibiotics: Secondary | ICD-10-CM | POA: Insufficient documentation

## 2015-03-27 DIAGNOSIS — N611 Abscess of the breast and nipple: Secondary | ICD-10-CM | POA: Diagnosis not present

## 2015-03-27 DIAGNOSIS — Z8619 Personal history of other infectious and parasitic diseases: Secondary | ICD-10-CM | POA: Diagnosis not present

## 2015-03-27 DIAGNOSIS — I10 Essential (primary) hypertension: Secondary | ICD-10-CM | POA: Insufficient documentation

## 2015-03-27 DIAGNOSIS — Z7951 Long term (current) use of inhaled steroids: Secondary | ICD-10-CM | POA: Insufficient documentation

## 2015-03-27 LAB — CBC WITH DIFFERENTIAL/PLATELET
BASOS ABS: 0 10*3/uL (ref 0.0–0.1)
BASOS PCT: 0 %
EOS ABS: 0.2 10*3/uL (ref 0.0–0.7)
EOS PCT: 2 %
HCT: 35.3 % — ABNORMAL LOW (ref 36.0–46.0)
Hemoglobin: 11.1 g/dL — ABNORMAL LOW (ref 12.0–15.0)
LYMPHS PCT: 38 %
Lymphs Abs: 3.3 10*3/uL (ref 0.7–4.0)
MCH: 25.8 pg — ABNORMAL LOW (ref 26.0–34.0)
MCHC: 31.4 g/dL (ref 30.0–36.0)
MCV: 82.1 fL (ref 78.0–100.0)
MONO ABS: 0.4 10*3/uL (ref 0.1–1.0)
Monocytes Relative: 4 %
Neutro Abs: 4.8 10*3/uL (ref 1.7–7.7)
Neutrophils Relative %: 56 %
PLATELETS: 296 10*3/uL (ref 150–400)
RBC: 4.3 MIL/uL (ref 3.87–5.11)
RDW: 14 % (ref 11.5–15.5)
WBC: 8.6 10*3/uL (ref 4.0–10.5)

## 2015-03-27 LAB — COMPREHENSIVE METABOLIC PANEL
ALBUMIN: 3.6 g/dL (ref 3.5–5.0)
ALT: 18 U/L (ref 14–54)
AST: 15 U/L (ref 15–41)
Alkaline Phosphatase: 61 U/L (ref 38–126)
Anion gap: 6 (ref 5–15)
BUN: 7 mg/dL (ref 6–20)
CALCIUM: 9.6 mg/dL (ref 8.9–10.3)
CO2: 29 mmol/L (ref 22–32)
Chloride: 105 mmol/L (ref 101–111)
Creatinine, Ser: 0.71 mg/dL (ref 0.44–1.00)
GFR calc Af Amer: >60 mL/min (ref >60)
GFR calc non Af Amer: >60 mL/min (ref >60)
Glucose, Bld: 98 mg/dL (ref 65–99)
POTASSIUM: 3.4 mmol/L — AB (ref 3.5–5.1)
SODIUM: 140 mmol/L (ref 135–145)
TOTAL PROTEIN: 6.8 g/dL (ref 6.5–8.1)
Total Bilirubin: 0.3 mg/dL (ref 0.3–1.2)

## 2015-03-27 NOTE — ED Notes (Signed)
The patient noticed she had a bump on her right breast.  It started as a small bump and it has gotten bigger.  She has had one on her left breast, and under her arm and she was given antibiotics by mouth and it got better.  She does have a red, swollen bump on her right breast.  It is the size of an egg.  It is painful, she rates it 10/10.  She has taken ibuprofen, put hot compresses and taken the rest of the antibiotics she was given the last time and it did not help.  She is here to be evaluated.

## 2015-03-28 ENCOUNTER — Emergency Department (HOSPITAL_COMMUNITY)
Admission: EM | Admit: 2015-03-28 | Discharge: 2015-03-28 | Disposition: A | Discharge status: Home / Self Care | Payer: Medicaid Other | Attending: Emergency Medicine | Admitting: Emergency Medicine

## 2015-03-28 DIAGNOSIS — N611 Abscess of the breast and nipple: Secondary | ICD-10-CM

## 2015-03-28 MED ORDER — HYDROCODONE-ACETAMINOPHEN 5-325 MG PO TABS
1.0000 | ORAL_TABLET | Freq: Once | ORAL | Status: AC
  Administered 2015-03-28: 1 via ORAL
  Filled 2015-03-28: qty 1

## 2015-03-28 MED ORDER — HYDROCODONE-ACETAMINOPHEN 5-325 MG PO TABS: 1.0000 | ORAL_TABLET | ORAL | Status: DC | PRN

## 2015-03-28 MED ORDER — LIDOCAINE-EPINEPHRINE 1 %-1:100000 IJ SOLN
20.0000 mL | Freq: Once | INTRAMUSCULAR | Status: AC
  Administered 2015-03-28: 1 mL via INTRADERMAL
  Filled 2015-03-28: qty 1

## 2015-03-28 MED ORDER — DOXYCYCLINE HYCLATE 100 MG PO CAPS: 100.0000 mg | ORAL_CAPSULE | Freq: Two times a day (BID) | ORAL | Status: DC

## 2015-03-28 NOTE — Discharge Instructions (Signed)

## 2015-03-28 NOTE — ED Provider Notes (Signed)
CSN: [id number]     Arrival date & time 03/27/15  2238 History  By signing my name below, I, Faith [name], attest that this documentation has been prepared under the direction and in the presence of Loren Raceravid Reiko Vinje, MD. Electronically Signed: Phillis HaggisGabriella [name], ED Scribe. 03/28/2015. 4:30 AM.  Chief Complaint  Patient presents with  . Breast Mass    The patient noticed she had a bump on her right breast.  It started as a small bump and it has gotten bigger.  She has had one on her left breast, and under her arm and she was given antibiotics by mouth and it got better.   The history is provided by the patient. No language interpreter was used.   HPI Comments: Faith [name] is a 24 y.o. female with a hx of HTN who presents to the Emergency Department complaining of a gradually worsening, painful mass to the right breast onset one week ago. Pt states that she has been seen for similar symptoms in the past for which she has taken doxycycline. She reports that she took some anti-biotics left over and has taken them yesterday to no relief. She states that her father has hx of abscesses. Pt reports "dizzy spells, where I feel like I'm tired." She denies changes in nipple or areola of the breast, fever, chills, nausea, and vomiting.   Past Medical History  Diagnosis Date  . Hypertension   . Urinary tract infection   . Chlamydia   . Infection     UTI  . Vaginal Pap smear, abnormal     f/u was ok   Past Surgical History  Procedure Laterality Date  . Cesarean section    . Dilation and evacuation N/A 10/04/2012    Procedure: DILATATION AND EVACUATION;  Surgeon: Lesly DukesKelly H Leggett, MD;  Location: [facility];  Service: Gynecology;  Laterality: N/A;   Family History  Problem Relation Age of Onset  . Anesthesia problems Neg Hx   . Hearing loss Neg Hx   . Diabetes Sister   . Hypertension Paternal Grandmother   . Cancer Paternal Grandfather    Social History  Substance Use Topics  . Smoking  status: Current Every Day Smoker -- 0.25 packs/day    Last Attempt to Quit: 10/31/2012  . Smokeless tobacco: Never Used  . Alcohol Use: Yes     Comment: occ   OB History    Gravida Para Term Preterm AB TAB SAB Ectopic Multiple Living   4 3 3  0 1 0 1 0 0 3     Review of Systems  Constitutional: Negative for fever and chills.  Respiratory: Negative for shortness of breath.   Cardiovascular: Negative for chest pain.  Gastrointestinal: Negative for nausea, vomiting and abdominal pain.  Musculoskeletal: Negative for back pain, neck pain and neck stiffness.  Skin: Positive for color change. Negative for wound.  Neurological: Negative for weakness, numbness and headaches.  All other systems reviewed and are negative.  Allergies  Review of patient's allergies indicates no known allergies.  Home Medications   Prior to Admission medications   Medication Sig Start Date End Date Taking? Authorizing Provider  amoxicillin (AMOXIL) 500 MG capsule Take 1 capsule (500 mg total) by mouth 3 (three) times daily. 08/25/14  Yes Charm RingsErin J Honig, MD  ibuprofen (ADVIL,MOTRIN) 200 MG tablet Take 200 mg by mouth every 6 (six) hours as needed for moderate pain.   Yes Historical Provider, MD  cefdinir (OMNICEF) 300 MG capsule Take 1 capsule (  300 mg total) by mouth 2 (two) times daily. Patient not taking: Reported on 02/20/2015 10/06/14   Linna Hoff, MD  doxycycline (VIBRAMYCIN) 100 MG capsule Take 1 capsule (100 mg total) by mouth 2 (two) times daily. 03/28/15   Loren Racer, MD  fluticasone (FLONASE) 50 MCG/ACT nasal spray Place 2 sprays into both nostrils at bedtime. Patient not taking: Reported on 02/20/2015 08/21/14   Ozella Rocks, MD  HYDROcodone-acetaminophen [facility]) 5-325 MG tablet Take 1-2 tablets by mouth every 4 (four) hours as needed for moderate pain. 03/28/15   Loren Racer, MD  ipratropium (ATROVENT) 0.06 % nasal spray Place 2 sprays into both nostrils 4 (four) times daily. Patient not  taking: Reported on 02/20/2015 08/21/14   Ozella Rocks, MD  ondansetron (ZOFRAN) 4 MG tablet Take 1 tablet (4 mg total) by mouth every 8 (eight) hours as needed for nausea or vomiting. Patient not taking: Reported on 02/20/2015 08/21/14   Ozella Rocks, MD   BP 113/59 mmHg  Pulse 79  Temp(Src) 97.9 F (36.6 C) (Oral)  Resp 18  Ht 5' (1.524 m)  Wt 235 lb (106.595 kg)  BMI 45.90 kg/m2  SpO2 99%  LMP 02/23/2015 Physical Exam  Constitutional: She is oriented to person, place, and time. She appears well-developed and well-nourished. No distress.  HENT:  Head: Normocephalic and atraumatic.  Mouth/Throat: Oropharynx is clear and moist.  Eyes: EOM are normal. Pupils are equal, round, and reactive to light.  Neck: Normal range of motion. Neck supple.  Cardiovascular: Normal rate and regular rhythm.   Pulmonary/Chest: Effort normal and breath sounds normal. No respiratory distress. She has no wheezes. She has no rales. She exhibits tenderness.  No nipple retraction or discharge.  Abdominal: Soft. Bowel sounds are normal. She exhibits no distension and no mass. There is no tenderness. There is no rebound and no guarding.  Musculoskeletal: Normal range of motion. She exhibits no edema or tenderness.  Neurological: She is alert and oriented to person, place, and time.  Skin: Skin is warm and dry. No rash noted. There is erythema.  Patient has a fluctuant mass on the medial surface of the right breast. There is mild overlying erythema with warmth. Mass is roughly 3 cm in diameter  Psychiatric: She has a normal mood and affect. Her behavior is normal.  Nursing note and vitals reviewed.   ED Course  Procedures (including critical care time) DIAGNOSTIC STUDIES: Oxygen Saturation is 100% on RA, normal by my interpretation.    COORDINATION OF CARE: 2:07 AM-Discussed treatment plan which includes labs, [place] and I&D with pt at bedside and pt agreed to plan.   INCISION AND DRAINAGE PROCEDURE  NOTE: Patient identification was confirmed and verbal consent was obtained. This procedure was performed by Loren Racer, MD at 2:45 AM. Site: right breast Sterile procedures observed Needle size: 25 Anesthetic used (type and amt): Lidocaine 1% w/ epi 2 ml Blade size:11 Drainage: Copious Complexity: Complex Left open Site anesthetized, incision made over site, wound drained and explored loculations, covered with dry, sterile dressing.  Pt tolerated procedure well without complications.  Instructions for care discussed verbally and pt provided with additional written instructions for homecare and f/u.   Labs Review Labs Reviewed  CBC WITH DIFFERENTIAL/PLATELET - Abnormal; Notable for the following:    Hemoglobin 11.1 (*)    HCT 35.3 (*)    MCH 25.8 (*)    All other components within normal limits  COMPREHENSIVE METABOLIC PANEL - Abnormal; Notable for  the following:    Potassium 3.4 (*)    All other components within normal limits    Imaging Review No results found. I have personally reviewed and evaluated these images and lab results as part of my medical decision-making.   EKG Interpretation None      MDM   Final diagnoses:  Abscess of breast, right   I personally performed the services described in this documentation, which was scribed in my presence. The recorded information has been reviewed and is accurate.      Loren Racer, MD 03/28/15 0430

## 2015-07-25 ENCOUNTER — Emergency Department (HOSPITAL_COMMUNITY)
Admission: EM | Admit: 2015-07-25 | Discharge: 2015-07-25 | Disposition: A | Discharge status: Home / Self Care | Payer: Medicaid Other | Source: Home / Self Care | Attending: Family Medicine | Admitting: Family Medicine

## 2015-07-25 ENCOUNTER — Encounter (HOSPITAL_COMMUNITY): Payer: Self-pay | Admitting: Emergency Medicine

## 2015-07-25 DIAGNOSIS — B349 Viral infection, unspecified: Secondary | ICD-10-CM | POA: Diagnosis not present

## 2015-07-25 DIAGNOSIS — J069 Acute upper respiratory infection, unspecified: Secondary | ICD-10-CM | POA: Diagnosis not present

## 2015-07-25 MED ORDER — ONDANSETRON HCL 4 MG PO TABS: 4.0000 mg | ORAL_TABLET | Freq: Four times a day (QID) | ORAL | Status: DC

## 2015-07-25 NOTE — ED Provider Notes (Signed)
CSN: [id number]     Arrival date & time 07/25/15  1308 History   First MD Initiated Contact with Patient 07/25/15 1406     Chief Complaint  Patient presents with  . Influenza   (Consider location/radiation/quality/duration/timing/severity/associated sxs/prior Treatment) HPI Comments: 25 year old female started having body aches 5 days ago. This is followed by upper respiratory congestion, decreased appetite, left ear discomfort and nausea. Denies vomiting. Denies sore throat. She states within the past 24 hours she said 5 episodes of loose stools. She has not taken any medications for her symptoms. She is able to hold down liquids without vomiting.   Past Medical History  Diagnosis Date  . Hypertension   . Urinary tract infection   . Chlamydia   . Infection     UTI  . Vaginal Pap smear, abnormal     f/u was ok   Past Surgical History  Procedure Laterality Date  . Cesarean section    . Dilation and evacuation N/A 10/04/2012    Procedure: DILATATION AND EVACUATION;  Surgeon: Lesly DukesKelly H Leggett, MD;  Location: [facility];  Service: Gynecology;  Laterality: N/A;   Family History  Problem Relation Age of Onset  . Anesthesia problems Neg Hx   . Hearing loss Neg Hx   . Diabetes Sister   . Hypertension Paternal Grandmother   . Cancer Paternal Grandfather    Social History  Substance Use Topics  . Smoking status: Former Smoker -- 0.25 packs/day    Quit date: 04/05/2015  . Smokeless tobacco: Never Used  . Alcohol Use: Yes     Comment: occ   OB History    Gravida Para Term Preterm AB TAB SAB Ectopic Multiple Living   4 3 3  0 1 0 1 0 0 3     Review of Systems  Constitutional: Positive for activity change, appetite change and fatigue. Negative for fever and chills.  HENT: Positive for congestion, ear pain and postnasal drip. Negative for facial swelling, rhinorrhea and sore throat.   Eyes: Negative.   Respiratory: Negative.  Negative for cough and shortness of breath.   Cardiovascular:  Negative.  Negative for chest pain.  Gastrointestinal: Positive for nausea. Negative for vomiting, abdominal pain and diarrhea.  Genitourinary: Negative.   Musculoskeletal: Negative for neck pain and neck stiffness.  Skin: Negative for pallor and rash.  Neurological: Negative.     Allergies  Review of patient's allergies indicates no known allergies.  Home Medications   Prior to Admission medications   Medication Sig Start Date End Date Taking? Authorizing Provider  amoxicillin (AMOXIL) 500 MG capsule Take 1 capsule (500 mg total) by mouth 3 (three) times daily. 08/25/14   Charm RingsErin J Honig, MD  cefdinir (OMNICEF) 300 MG capsule Take 1 capsule (300 mg total) by mouth 2 (two) times daily. Patient not taking: Reported on 02/20/2015 10/06/14   Linna HoffJames D Kindl, MD  doxycycline (VIBRAMYCIN) 100 MG capsule Take 1 capsule (100 mg total) by mouth 2 (two) times daily. 03/28/15   Loren Raceravid Yelverton, MD  fluticasone (FLONASE) 50 MCG/ACT nasal spray Place 2 sprays into both nostrils at bedtime. Patient not taking: Reported on 02/20/2015 08/21/14   Ozella Rocksavid J Merrell, MD  HYDROcodone-acetaminophen [facility]) 5-325 MG tablet Take 1-2 tablets by mouth every 4 (four) hours as needed for moderate pain. 03/28/15   Loren Raceravid Yelverton, MD  ibuprofen (ADVIL,MOTRIN) 200 MG tablet Take 200 mg by mouth every 6 (six) hours as needed for moderate pain.    Historical Provider, MD  ipratropium (  ATROVENT) 0.06 % nasal spray Place 2 sprays into both nostrils 4 (four) times daily. Patient not taking: Reported on 02/20/2015 08/21/14   Ozella Rocks, MD  ondansetron (ZOFRAN) 4 MG tablet Take 1 tablet (4 mg total) by mouth every 8 (eight) hours as needed for nausea or vomiting. Patient not taking: Reported on 02/20/2015 08/21/14   Ozella Rocks, MD   Meds Ordered and Administered this Visit  Medications - No data to display  BP 141/87 mmHg  Pulse 77  Temp(Src) 98.1 F (36.7 C) (Oral)  Resp 20  SpO2 99%  LMP 07/25/2015 No data  found.   Physical Exam  Constitutional: She is oriented to person, place, and time. She appears well-developed and well-nourished. No distress.  HENT:  Mouth/Throat: No oropharyngeal exudate.  Bilateral TMs are normal. Clear, no retraction or erythema. Oropharynx with minor cobblestoning and moderate amount of clear PND.  Eyes: Conjunctivae and EOM are normal.  Neck: Normal range of motion. Neck supple.  Cardiovascular: Normal rate, regular rhythm and normal heart sounds.   Pulmonary/Chest: Effort normal and breath sounds normal. No respiratory distress. She has no wheezes. She has no rales.  Abdominal: Soft. There is no tenderness. There is no rebound and no guarding.  Musculoskeletal: Normal range of motion. She exhibits no edema.  Lymphadenopathy:    She has no cervical adenopathy.  Neurological: She is alert and oriented to person, place, and time.  Skin: Skin is warm and dry. No rash noted.  Psychiatric: She has a normal mood and affect.  Vitals reviewed.   ED Course  Procedures (including critical care time)  Labs Review Labs Reviewed - No data to display  Imaging Review No results found.   Visual Acuity Review  Right Eye Distance:   Left Eye Distance:   Bilateral Distance:    Right Eye Near:   Left Eye Near:    Bilateral Near:         MDM   1. URI (upper respiratory infection)   2. Viral syndrome    Viral Infections For nasal and head congestion may take Sudafed PE 10 mg every 4 hours as needed. Saline nasal spray used frequently. For drainage may use Allegra, Claritin or Zyrtec. If you need stronger medicine to stop drainage may take Chlor-Trimeton 2-4 mg every 4 hours. This may cause drowsiness. Ibuprofen 600 mg every 6 hours as needed for pain, discomfort or fever. If ibuprofen causes your nausea to be worse changed to Tylenol every 4 hours. Drink plenty of fluids and stay well-hydrated. Meds ordered this encounter  Medications  . ondansetron  (ZOFRAN) 4 MG tablet    Sig: Take 1 tablet (4 mg total) by mouth every 6 (six) hours.    Dispense:  12 tablet    Refill:  0    Order Specific Question:  Supervising Provider    Answer:  Micheline Chapman      Hayden Rasmussen, NP 07/25/15 1517

## 2015-07-25 NOTE — ED Notes (Signed)
PT reports body aches, cough, nausea, loss of appetite that started Friday and has gotten worse. PT states, "I feel like I got hit by a bus."

## 2015-07-25 NOTE — Discharge Instructions (Signed)
Viral Infections For nasal and head congestion may take Sudafed PE 10 mg every 4 hours as needed. Saline nasal spray used frequently. For drainage may use Allegra, Claritin or Zyrtec. If you need stronger medicine to stop drainage may take Chlor-Trimeton 2-4 mg every 4 hours. This may cause drowsiness. Ibuprofen 600 mg every 6 hours as needed for pain, discomfort or fever. If ibuprofen causes your nausea to be worse changed to Tylenol every 4 hours. Drink plenty of fluids and stay well-hydrated.  A viral infection can be caused by different types of viruses.Most viral infections are not serious and resolve on their own. However, some infections may cause severe symptoms and may lead to further complications. SYMPTOMS Viruses can frequently cause:  Minor sore throat.  Aches and pains.  Headaches.  Runny nose.  Different types of rashes.  Watery eyes.  Tiredness.  Cough.  Loss of appetite.  Gastrointestinal infections, resulting in nausea, vomiting, and diarrhea. These symptoms do not respond to antibiotics because the infection is not caused by bacteria. However, you might catch a bacterial infection following the viral infection. This is sometimes called a "superinfection." Symptoms of such a bacterial infection may include:  Worsening sore throat with pus and difficulty swallowing.  Swollen neck glands.  Chills and a high or persistent fever.  Severe headache.  Tenderness over the sinuses.  Persistent overall ill feeling (malaise), muscle aches, and tiredness (fatigue).  Persistent cough.  Yellow, green, or brown mucus production with coughing. HOME CARE INSTRUCTIONS   Only take over-the-counter or prescription medicines for pain, discomfort, diarrhea, or fever as directed by your caregiver.  Drink enough water and fluids to keep your urine clear or pale yellow. Sports drinks can provide valuable electrolytes, sugars, and hydration.  Get plenty of rest and  maintain proper nutrition. Soups and broths with crackers or rice are fine. SEEK IMMEDIATE MEDICAL CARE IF:   You have severe headaches, shortness of breath, chest pain, neck pain, or an unusual rash.  You have uncontrolled vomiting, diarrhea, or you are unable to keep down fluids.  You or your child has an oral temperature above 102 F (38.9 C), not controlled by medicine.  Your baby is older than 3 months with a rectal temperature of 102 F (38.9 C) or higher.  Your baby is 553 months old or younger with a rectal temperature of 100.4 F (38 C) or higher. MAKE SURE YOU:   Understand these instructions.  Will watch your condition.  Will get help right away if you are not doing well or get worse.   This information is not intended to replace advice given to you by your health care provider. Make sure you discuss any questions you have with your health care provider.   Document Released: 01/29/2005 Document Revised: 07/14/2011 Document Reviewed: 09/27/2014 Elsevier Interactive Patient Education 2016 Else[employer] Inc.  Upper Respiratory Infection, Adult Most upper respiratory infections (URIs) are caused by a virus. A URI affects the nose, throat, and upper air passages. The most common type of URI is often called "the common cold." HOME CARE   Take medicines only as told by your doctor.  Gargle warm saltwater or take cough drops to comfort your throat as told by your doctor.  Use a warm mist humidifier or inhale steam from a shower to increase air moisture. This may make it easier to breathe.  Drink enough fluid to keep your pee (urine) clear or pale yellow.  Eat soups and other clear broths.  Have  a healthy diet.  Rest as needed.  Go back to work when your fever is gone or your doctor says it is okay.  You may need to stay home longer to avoid giving your URI to others.  You can also wear a face mask and wash your hands often to prevent spread of the virus.  Use your  inhaler more if you have asthma.  Do not use any tobacco products, including cigarettes, chewing tobacco, or electronic cigarettes. If you need help quitting, ask your doctor. GET HELP IF:  You are getting worse, not better.  Your symptoms are not helped by medicine.  You have chills.  You are getting more short of breath.  You have brown or red mucus.  You have yellow or brown discharge from your nose.  You have pain in your face, especially when you bend forward.  You have a fever.  You have puffy (swollen) neck glands.  You have pain while swallowing.  You have white areas in the back of your throat. GET HELP RIGHT AWAY IF:   You have very bad or constant:  Headache.  Ear pain.  Pain in your forehead, behind your eyes, and over your cheekbones (sinus pain).  Chest pain.  You have long-lasting (chronic) lung disease and any of the following:  Wheezing.  Long-lasting cough.  Coughing up blood.  A change in your usual mucus.  You have a stiff neck.  You have changes in your:  Vision.  Hearing.  Thinking.  Mood. MAKE SURE YOU:   Understand these instructions.  Will watch your condition.  Will get help right away if you are not doing well or get worse.   This information is not intended to replace advice given to you by your health care provider. Make sure you discuss any questions you have with your health care provider.   Document Released: 10/08/2007 Document Revised: 09/05/2014 Document Reviewed: 07/27/2013 Elsevier Interactive Patient Education [employer]

## 2015-09-13 ENCOUNTER — Emergency Department (HOSPITAL_COMMUNITY)
Admission: EM | Admit: 2015-09-13 | Discharge: 2015-09-13 | Disposition: A | Discharge status: Home / Self Care | Payer: No Typology Code available for payment source | Attending: Emergency Medicine | Admitting: Emergency Medicine

## 2015-09-13 ENCOUNTER — Encounter (HOSPITAL_COMMUNITY): Payer: Self-pay | Admitting: Emergency Medicine

## 2015-09-13 ENCOUNTER — Emergency Department (HOSPITAL_COMMUNITY): Payer: No Typology Code available for payment source

## 2015-09-13 DIAGNOSIS — Y9389 Activity, other specified: Secondary | ICD-10-CM | POA: Insufficient documentation

## 2015-09-13 DIAGNOSIS — Y92410 Unspecified street and highway as the place of occurrence of the external cause: Secondary | ICD-10-CM | POA: Diagnosis not present

## 2015-09-13 DIAGNOSIS — M25561 Pain in right knee: Secondary | ICD-10-CM | POA: Diagnosis present

## 2015-09-13 DIAGNOSIS — M542 Cervicalgia: Secondary | ICD-10-CM | POA: Diagnosis not present

## 2015-09-13 DIAGNOSIS — I10 Essential (primary) hypertension: Secondary | ICD-10-CM | POA: Diagnosis not present

## 2015-09-13 DIAGNOSIS — Y999 Unspecified external cause status: Secondary | ICD-10-CM | POA: Insufficient documentation

## 2015-09-13 DIAGNOSIS — Z79891 Long term (current) use of opiate analgesic: Secondary | ICD-10-CM | POA: Insufficient documentation

## 2015-09-13 DIAGNOSIS — Z79899 Other long term (current) drug therapy: Secondary | ICD-10-CM | POA: Diagnosis not present

## 2015-09-13 DIAGNOSIS — Z87891 Personal history of nicotine dependence: Secondary | ICD-10-CM | POA: Diagnosis not present

## 2015-09-13 DIAGNOSIS — Z792 Long term (current) use of antibiotics: Secondary | ICD-10-CM | POA: Insufficient documentation

## 2015-09-13 MED ORDER — ACETAMINOPHEN 325 MG PO TABS
650.0000 mg | ORAL_TABLET | Freq: Once | ORAL | Status: AC
  Administered 2015-09-13: 650 mg via ORAL
  Filled 2015-09-13: qty 2

## 2015-09-13 MED ORDER — METHOCARBAMOL 500 MG PO TABS: 500.0000 mg | ORAL_TABLET | Freq: Two times a day (BID) | ORAL | Status: DC

## 2015-09-13 MED ORDER — TRAMADOL HCL 50 MG PO TABS: 50.0000 mg | ORAL_TABLET | Freq: Four times a day (QID) | ORAL | Status: DC | PRN
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
## 2015-09-13 NOTE — Discharge Instructions (Signed)
Take your medications as prescribed. I also recommend applying ice to affected area for 15-20 minutes 3-4 times daily as needed for pain relief. Please follow up with a primary care provider from the Resource Guide provided below in 1 week if your symptoms have not improved. Please return to the Emergency Department if symptoms worsen or new onset of fever, numbness, tingling, groin numbness, loss of bowel or bladder, weakness, redness, swelling, warmth, paralysis.

## 2016-01-29 ENCOUNTER — Encounter: Payer: Self-pay | Admitting: *Deleted

## 2016-01-29 LAB — PROCEDURE REPORT - SCANNED: PAP SMEAR: ABNORMAL — AB

## 2016-01-31 ENCOUNTER — Inpatient Hospital Stay (HOSPITAL_COMMUNITY)
Admission: AD | Admit: 2016-01-31 | Discharge: 2016-01-31 | Disposition: A | Discharge status: Home / Self Care | Payer: Medicaid Other | Source: Ambulatory Visit | Attending: Obstetrics & Gynecology | Admitting: Obstetrics & Gynecology

## 2016-01-31 ENCOUNTER — Inpatient Hospital Stay (HOSPITAL_COMMUNITY): Payer: Medicaid Other

## 2016-01-31 ENCOUNTER — Encounter (HOSPITAL_COMMUNITY): Payer: Self-pay | Admitting: *Deleted

## 2016-01-31 DIAGNOSIS — Z87891 Personal history of nicotine dependence: Secondary | ICD-10-CM | POA: Diagnosis not present

## 2016-01-31 DIAGNOSIS — O26899 Other specified pregnancy related conditions, unspecified trimester: Secondary | ICD-10-CM

## 2016-01-31 DIAGNOSIS — O23591 Infection of other part of genital tract in pregnancy, first trimester: Secondary | ICD-10-CM | POA: Insufficient documentation

## 2016-01-31 DIAGNOSIS — R87629 Unspecified abnormal cytological findings in specimens from vagina: Secondary | ICD-10-CM | POA: Insufficient documentation

## 2016-01-31 DIAGNOSIS — Z3A01 Less than 8 weeks gestation of pregnancy: Secondary | ICD-10-CM | POA: Insufficient documentation

## 2016-01-31 DIAGNOSIS — R109 Unspecified abdominal pain: Secondary | ICD-10-CM | POA: Diagnosis present

## 2016-01-31 DIAGNOSIS — R102 Pelvic and perineal pain: Secondary | ICD-10-CM

## 2016-01-31 DIAGNOSIS — B9689 Other specified bacterial agents as the cause of diseases classified elsewhere: Secondary | ICD-10-CM

## 2016-01-31 DIAGNOSIS — O209 Hemorrhage in early pregnancy, unspecified: Secondary | ICD-10-CM | POA: Diagnosis not present

## 2016-01-31 DIAGNOSIS — O43891 Other placental disorders, first trimester: Secondary | ICD-10-CM | POA: Diagnosis not present

## 2016-01-31 DIAGNOSIS — O468X1 Other antepartum hemorrhage, first trimester: Secondary | ICD-10-CM

## 2016-01-31 DIAGNOSIS — O418X10 Other specified disorders of amniotic fluid and membranes, first trimester, not applicable or unspecified: Secondary | ICD-10-CM

## 2016-01-31 DIAGNOSIS — N760 Acute vaginitis: Secondary | ICD-10-CM

## 2016-01-31 LAB — URINALYSIS, ROUTINE W REFLEX MICROSCOPIC
Bilirubin Urine: NEGATIVE
GLUCOSE, UA: NEGATIVE mg/dL
HGB URINE DIPSTICK: NEGATIVE
Ketones, ur: 15 mg/dL — AB
Nitrite: NEGATIVE
PH: 6 (ref 5.0–8.0)
PROTEIN: NEGATIVE mg/dL

## 2016-01-31 LAB — CBC
HCT: 34.1 % — ABNORMAL LOW (ref 36.0–46.0)
HEMOGLOBIN: 11.1 g/dL — AB (ref 12.0–15.0)
MCH: 26 pg (ref 26.0–34.0)
MCHC: 32.6 g/dL (ref 30.0–36.0)
MCV: 79.9 fL (ref 78.0–100.0)
Platelets: 286 10*3/uL (ref 150–400)
RBC: 4.27 MIL/uL (ref 3.87–5.11)
RDW: 14.8 % (ref 11.5–15.5)
WBC: 8.4 10*3/uL (ref 4.0–10.5)

## 2016-01-31 LAB — WET PREP, GENITAL
Sperm: NONE SEEN
Trich, Wet Prep: NONE SEEN
YEAST WET PREP: NONE SEEN

## 2016-01-31 LAB — POCT PREGNANCY, URINE: Preg Test, Ur: POSITIVE — AB

## 2016-01-31 LAB — URINE MICROSCOPIC-ADD ON: RBC / HPF: NONE SEEN RBC/hpf (ref 0–5)

## 2016-01-31 LAB — PREGNANCY, URINE: Preg Test, Ur: POSITIVE — AB

## 2016-01-31 LAB — HCG, QUANTITATIVE, PREGNANCY: HCG, BETA CHAIN, QUANT, S: 5226 m[IU]/mL — AB (ref <5)

## 2016-01-31 NOTE — MAU Note (Signed)
Pt states she is having some abdominal cramping that has been going on for 1.5 weeks.  Pt states she is having nausea.  Pt states she lost her appetite.  Pt states she had a positive pregnancy test.

## 2016-01-31 NOTE — MAU Provider Note (Signed)
Chief Complaint: Abdominal Pain   First Provider Initiated Contact with Patient 01/31/16 1400     SUBJECTIVE HPI: Faith [name] is a 25 y.o. [id number] at [id number] by LMP who presents to maternity admissions reporting abdominal cramping for 1-2 weeks.  Also has some nausea.  Had a positive pregnancy test last week. . She denies vaginal bleeding, vaginal itching/burning, urinary symptoms, h/a, dizziness, n/v, or fever/chills.    Abdominal Pain  This is a new problem. The current episode started 1 to 4 weeks ago. The onset quality is gradual. The problem occurs intermittently. The problem has been waxing and waning. The pain is located in the LLQ, RLQ and suprapubic region. The pain is mild. The quality of the pain is cramping. The abdominal pain does not radiate. Associated symptoms include anorexia. Pertinent negatives include no constipation, diarrhea, dysuria, fever, nausea or vomiting. Nothing aggravates the pain. The pain is relieved by nothing. She has tried nothing for the symptoms.   RN Note: Pt states she is having some abdominal cramping that has been going on for 1.5 weeks.  Pt states she is having nausea.  Pt states she lost her appetite.  Pt states she had a positive pregnancy test.  Past Medical History:  Diagnosis Date  . Chlamydia   . Hypertension   . Infection    UTI  . Urinary tract infection   . Vaginal Pap smear, abnormal    f/u was ok   Past Surgical History:  Procedure Laterality Date  . CESAREAN SECTION    . DILATION AND EVACUATION N/A 10/04/2012   Procedure: DILATATION AND EVACUATION;  Surgeon: Lesly DukesKelly H Leggett, MD;  Location: [facility];  Service: Gynecology;  Laterality: N/A;   Social History   Social History  . Marital status: Single    Spouse name: N/A  . Number of children: N/A  . Years of education: N/A   Occupational History  . Not on file.   Social History Main Topics  . Smoking status: Former Smoker    Packs/day: 0.25    Quit date: 04/05/2015  .  Smokeless tobacco: Never Used  . Alcohol use Yes     Comment: occ  . Drug use: No  . Sexual activity: Yes    Birth control/ protection: None   Other Topics Concern  . Not on file   Social History Narrative  . No narrative on file   No current facility-administered medications on file prior to encounter.    Current Outpatient Prescriptions on File Prior to Encounter  Medication Sig Dispense Refill  . methocarbamol (ROBAXIN) 500 MG tablet Take 1 tablet (500 mg total) by mouth 2 (two) times daily. (Patient not taking: Reported on 01/31/2016) 20 tablet 0  . traMADol (ULTRAM) 50 MG tablet Take 1 tablet (50 mg total) by mouth every 6 (six) hours as needed. (Patient not taking: Reported on 01/31/2016) 15 tablet 0  . [DISCONTINUED] fluticasone (FLONASE) 50 MCG/ACT nasal spray Place 2 sprays into both nostrils at bedtime. (Patient not taking: Reported on 02/20/2015) 16 g 0  . [DISCONTINUED] ipratropium (ATROVENT) 0.06 % nasal spray Place 2 sprays into both nostrils 4 (four) times daily. (Patient not taking: Reported on 02/20/2015) 15 mL 12   No Known Allergies  I have reviewed patient's Past Medical Hx, Surgical Hx, Family Hx, Social Hx, medications and allergies.   ROS:  Review of Systems  Constitutional: Negative for fever.  Gastrointestinal: Positive for abdominal pain and anorexia. Negative for constipation, diarrhea, nausea and vomiting.  Genitourinary: Positive for pelvic pain. Negative for dysuria, vaginal bleeding and vaginal discharge.  Neurological: Negative for dizziness.   Other systems negative   Physical Exam  Patient Vitals for the past 24 hrs:  BP Temp Temp src Pulse Resp SpO2  01/31/16 1403 (!) 119/51 98.3 F (36.8 C) Oral 86 18 100 %   Physical Exam  Constitutional: Well-developed, well-nourished female in no acute distress.  Cardiovascular: normal rate Respiratory: normal effort GI: Abd soft, non-tender. Pos BS x 4 MS: Extremities nontender, no edema, normal  ROM Neurologic: Alert and oriented x 4.  GU: Neg CVAT.  PELVIC EXAM: Cervix pink, visually closed, without lesion, scant white creamy discharge, vaginal walls and external genitalia normal Bimanual exam: Cervix 0/long/high, firm, anterior, neg CMT, uterus nontender, nonenlarged, adnexa without tenderness, enlargement, or mass    LAB RESULTS Results for orders placed or performed during the hospital encounter of 01/31/16 (from the past 24 hour(s))  Urinalysis, Routine w reflex microscopic (not at [facility])     Status: Abnormal   Collection Time: 01/31/16  1:41 PM  Result Value Ref Range   Color, Urine YELLOW YELLOW   APPearance CLEAR CLEAR   Specific Gravity, Urine >1.030 (H) 1.005 - 1.030   pH 6.0 5.0 - 8.0   Glucose, UA NEGATIVE NEGATIVE mg/dL   Hgb urine dipstick NEGATIVE NEGATIVE   Bilirubin Urine NEGATIVE NEGATIVE   Ketones, ur 15 (A) NEGATIVE mg/dL   Protein, ur NEGATIVE NEGATIVE mg/dL   Nitrite NEGATIVE NEGATIVE   Leukocytes, UA SMALL (A) NEGATIVE  Pregnancy, urine     Status: Abnormal   Collection Time: 01/31/16  1:41 PM  Result Value Ref Range   Preg Test, Ur POSITIVE (A) NEGATIVE  Urine microscopic-add on     Status: Abnormal   Collection Time: 01/31/16  1:41 PM  Result Value Ref Range   Squamous Epithelial / LPF 6-30 (A) NONE SEEN   WBC, UA 0-5 0 - 5 WBC/hpf   RBC / HPF NONE SEEN 0 - 5 RBC/hpf   Bacteria, UA MANY (A) NONE SEEN   Urine-Other MUCOUS PRESENT   Pregnancy, urine POC     Status: Abnormal   Collection Time: 01/31/16  1:57 PM  Result Value Ref Range   Preg Test, Ur POSITIVE (A) NEGATIVE  CBC     Status: Abnormal   Collection Time: 01/31/16  2:08 PM  Result Value Ref Range   WBC 8.4 4.0 - 10.5 K/uL   RBC 4.27 3.87 - 5.11 MIL/uL   Hemoglobin 11.1 (L) 12.0 - 15.0 g/dL   HCT 16.1 (L) 09.6 - 04.5 %   MCV 79.9 78.0 - 100.0 fL   MCH 26.0 26.0 - 34.0 pg   MCHC 32.6 30.0 - 36.0 g/dL   RDW 40.9 81.1 - 91.4 %   Platelets 286 150 - 400 K/uL  hCG, quantitative,  pregnancy     Status: Abnormal   Collection Time: 01/31/16  2:08 PM  Result Value Ref Range   hCG, Beta Chain, Quant, S 5,226 (H) <5 mIU/mL  Wet prep, genital     Status: Abnormal   Collection Time: 01/31/16  2:40 PM  Result Value Ref Range   Yeast Wet Prep HPF POC NONE SEEN NONE SEEN   Trich, Wet Prep NONE SEEN NONE SEEN   Clue Cells Wet Prep HPF POC PRESENT (A) NONE SEEN   WBC, Wet Prep HPF POC MANY (A) NONE SEEN   Sperm NONE SEEN  IMAGING US Ob Comp Less 14 Wks  Result Date: 01/31/2016 CLINICAL DATA:  Assess viability.  Pain and cramping. EXAM: OBSTETRIC <14 WK [place] AND TRANSVAGINAL OB US TECHNIQUE: Both transabdominal and transvaginal ultrasound examinations were performed for complete evaluation of the gestation as well as the maternal uterus, adnexal regions, and pelvic cul-de-sac. Transvaginal technique was performed to assess early pregnancy. COMPARISON:  None FINDINGS: Intrauterine gestational sac: Single Yolk sac:  Yes Embryo:  No MSD: 9  mm   5 w   5  d Maternal uterus/adnexae: Normal Subchorionic hemorrhage: Large Right ovary: Normal Left ovary: Normal Other :None Free fluid:  Trace IMPRESSION: 1. Probable early intrauterine gestational sac containing a single yolk sac. No fetal pole, or cardiac activity yet visualized. Recommend follow-up quantitative B-HCG levels and follow-up US in 14 days to confirm and assess viability. This recommendation follows SRU consensus guidelines: Diagnostic Criteria for Nonviable Pregnancy Early in the First Trimester. Malva Limes Med 2013; 161:0960-45. 2. Large subchorionic hemorrhage. Electronically Signed   By: Signa Kell M.D.   On: 01/31/2016 16:28   US Ob Transvaginal  Result Date: 01/31/2016 CLINICAL DATA:  Assess viability.  Pain and cramping. EXAM: OBSTETRIC <14 WK [place] AND TRANSVAGINAL OB US TECHNIQUE: Both transabdominal and transvaginal ultrasound examinations were performed for complete evaluation of the gestation as well as the  maternal uterus, adnexal regions, and pelvic cul-de-sac. Transvaginal technique was performed to assess early pregnancy. COMPARISON:  None FINDINGS: Intrauterine gestational sac: Single Yolk sac:  Yes Embryo:  No MSD: 9  mm   5 w   5  d Maternal uterus/adnexae: Normal Subchorionic hemorrhage: Large Right ovary: Normal Left ovary: Normal Other :None Free fluid:  Trace IMPRESSION: 1. Probable early intrauterine gestational sac containing a single yolk sac. No fetal pole, or cardiac activity yet visualized. Recommend follow-up quantitative B-HCG levels and follow-up US in 14 days to confirm and assess viability. This recommendation follows SRU consensus guidelines: Diagnostic Criteria for Nonviable Pregnancy Early in the First Trimester. Malva Limes Med 2013; 409:8119-14. 2. Large subchorionic hemorrhage. Electronically Signed   By: Signa Kell M.D.   On: 01/31/2016 16:28     MAU Management/MDM: Ordered usual first trimester r/o ectopic labs.   Pelvic exam and cultures done Will check baseline Ultrasound to rule out ectopic.  This bleeding/pain can represent a normal pregnancy with bleeding, spontaneous abortion or even an ectopic which can be life-threatening.  The process as listed above helps to determine which of these is present.  Discussed [place] results. They rule out ectopic but we cannot assess fetal viabiltiy yet.  Also discussed subchorionic hemorrhage and possible risk of miscarriage.  Pt states she was probably going to abort anyway.  Discussed may want to wait and see if this is a spontaneous abortion anyway.  ASSESSMENT Pregnancy at [redacted]w[redacted]d by LMP Abdominal pain Large subchorionic hemorrhage BV  PLAN Discharge home Will repeat US in two weeks and have her followup in [place]    Pt stable at time of discharge. Encouraged to return here or to other Urgent Care/ED if she develops worsening of symptoms, increase in pain, fever, or other concerning symptoms.    Wynelle Bourgeois CNM,  MSN Certified Nurse-Midwife 01/31/2016  2:41 PM

## 2016-01-31 NOTE — Discharge Instructions (Signed)
Pelvic Rest °Pelvic rest is sometimes recommended for women when:  °· The placenta is partially or completely covering the opening of the cervix (placenta previa). °· There is bleeding between the uterine wall and the amniotic sac in the first trimester (subchorionic hemorrhage). °· The cervix begins to open without labor starting (incompetent cervix, cervical insufficiency). °· The labor is too early (preterm labor). °HOME CARE INSTRUCTIONS °· Do not have sexual intercourse, stimulation, or an orgasm. °· Do not use tampons, douche, or put anything in the vagina. °· Do not lift anything over 10 pounds (4.5 kg). °· Avoid strenuous activity or straining your pelvic muscles. °SEEK MEDICAL CARE IF:  °· You have any vaginal bleeding during pregnancy. Treat this as a potential emergency. °· You have cramping pain felt low in the stomach (stronger than menstrual cramps). °· You notice vaginal discharge (watery, mucus, or bloody). °· You have a low, dull backache. °· There are regular contractions or uterine tightening. °SEEK IMMEDIATE MEDICAL CARE IF: °You have vaginal bleeding and have placenta previa.  °  °This information is not intended to replace advice given to you by your health care provider. Make sure you discuss any questions you have with your health care provider. °  °Document Released: 08/16/2010 Document Revised: 07/14/2011 Document Reviewed: 10/23/2014 °Elsevier Interactive Patient Education ©2016 [employer] ° ° °Threatened Miscarriage °A threatened miscarriage occurs when you have vaginal bleeding during your first 20 weeks of pregnancy but the pregnancy has not ended. If you have vaginal bleeding during this time, your health care provider will do tests to make sure you are still pregnant. If the tests show you are still pregnant and the developing baby (fetus) inside your womb (uterus) is still growing, your condition is considered a threatened miscarriage. °A threatened miscarriage does not mean your  pregnancy will end, but it does increase the risk of losing your pregnancy (complete miscarriage). °CAUSES  °The cause of a threatened miscarriage is usually not known. If you go on to have a complete miscarriage, the most common cause is an abnormal number of chromosomes in the developing baby. Chromosomes are the structures inside cells that hold all your genetic material. °Some causes of vaginal bleeding that do not result in miscarriage include: °· Having sex. °· Having an infection. °· Normal hormone changes of pregnancy. °· Bleeding that occurs when an egg implants in your uterus. °RISK FACTORS °Risk factors for bleeding in early pregnancy include: °· Obesity. °· Smoking. °· Drinking excessive amounts of alcohol or caffeine. °· Recreational drug use. °SIGNS AND SYMPTOMS °· Light vaginal bleeding. °· Mild abdominal pain or cramps. °DIAGNOSIS  °If you have bleeding with or without abdominal pain before 20 weeks of pregnancy, your health care provider will do tests to check whether you are still pregnant. One important test involves using sound waves and a computer (ultrasound) to create images of the inside of your uterus. Other tests include an internal exam of your vagina and uterus (pelvic exam) and measurement of your baby's heart rate.  °You may be diagnosed with a threatened miscarriage if: °· Ultrasound testing shows you are still pregnant. °· Your baby's heart rate is strong. °· A pelvic exam shows that the opening between your uterus and your vagina (cervix) is closed. °· Your heart rate and blood pressure are stable. °· Blood tests confirm you are still pregnant. °TREATMENT  °No treatments have been shown to prevent a threatened miscarriage from going on to a complete miscarriage. However, the right home   care is important.  °HOME CARE INSTRUCTIONS  °· Make sure you keep all your appointments for prenatal care. This is very important. °· Get plenty of rest. °· Do not have sex or use tampons if you have  vaginal bleeding. °· Do not douche. °· Do not smoke or use recreational drugs. °· Do not drink alcohol. °· Avoid caffeine. °SEEK MEDICAL CARE IF: °· You have light vaginal bleeding or spotting while pregnant. °· You have abdominal pain or cramping. °· You have a fever. °SEEK IMMEDIATE MEDICAL CARE IF: °· You have heavy vaginal bleeding. °· You have blood clots coming from your vagina. °· You have severe low back pain or abdominal cramps. °· You have fever, chills, and severe abdominal pain. °MAKE SURE YOU: °· Understand these instructions. °· Will watch your condition. °· Will get help right away if you are not doing well or get worse. °  °This information is not intended to replace advice given to you by your health care provider. Make sure you discuss any questions you have with your health care provider. °  °Document Released: 04/21/2005 Document Revised: 04/26/2013 Document Reviewed: 02/15/2013 °Elsevier Interactive Patient Education ©2016 [employer] ° °

## 2016-02-01 LAB — GC/CHLAMYDIA PROBE AMP (~~LOC~~) NOT AT ARMC
CHLAMYDIA, DNA PROBE: NEGATIVE
Neisseria Gonorrhea: NEGATIVE

## 2016-02-01 LAB — HIV ANTIBODY (ROUTINE TESTING W REFLEX): HIV SCREEN 4TH GENERATION: NONREACTIVE

## 2016-02-07 ENCOUNTER — Telehealth: Payer: Self-pay | Admitting: *Deleted

## 2016-02-07 NOTE — Telephone Encounter (Signed)
Patient called, left message that she had been to [facility] and had an u/s there and was supposed to have another in 2 weeks. Needs to have this scheduled.

## 2016-02-08 NOTE — Telephone Encounter (Signed)
U/s scheduled for 10/11 @ 1100. Called patient and made her aware. Understanding voiced.

## 2016-02-13 ENCOUNTER — Ambulatory Visit: Payer: Medicaid Other | Admitting: *Deleted

## 2016-02-13 ENCOUNTER — Encounter: Payer: Self-pay | Admitting: Advanced Practice Midwife

## 2016-02-13 ENCOUNTER — Ambulatory Visit (HOSPITAL_COMMUNITY)
Admission: RE | Admit: 2016-02-13 | Discharge: 2016-02-13 | Disposition: A | Discharge status: Home / Self Care | Payer: Medicaid Other | Source: Ambulatory Visit | Attending: Advanced Practice Midwife | Admitting: Advanced Practice Midwife

## 2016-02-13 DIAGNOSIS — O418X10 Other specified disorders of amniotic fluid and membranes, first trimester, not applicable or unspecified: Secondary | ICD-10-CM

## 2016-02-13 DIAGNOSIS — Z712 Person consulting for explanation of examination or test findings: Secondary | ICD-10-CM

## 2016-02-13 DIAGNOSIS — Z3A09 9 weeks gestation of pregnancy: Secondary | ICD-10-CM | POA: Insufficient documentation

## 2016-02-13 DIAGNOSIS — Z362 Encounter for other antenatal screening follow-up: Secondary | ICD-10-CM | POA: Diagnosis not present

## 2016-02-13 DIAGNOSIS — O468X1 Other antepartum hemorrhage, first trimester: Secondary | ICD-10-CM

## 2016-02-13 NOTE — Progress Notes (Signed)
Pt advised of US results (single viable pregnancy, EDD 09/28/16) after consultation with Dr. Glenice BowAnynawu. Pt states she had Ob/Gyn care with Dr. Gaynell FaceMarshall previously and intends to schedule prenatal [facility] [place] [facility]

## 2016-05-08 ENCOUNTER — Ambulatory Visit (HOSPITAL_COMMUNITY)
Admission: EM | Admit: 2016-05-08 | Discharge: 2016-05-08 | Disposition: A | Discharge status: Home / Self Care | Payer: Self-pay | Attending: Emergency Medicine | Admitting: Emergency Medicine

## 2016-05-08 ENCOUNTER — Encounter (HOSPITAL_COMMUNITY): Payer: Self-pay | Admitting: Emergency Medicine

## 2016-05-08 DIAGNOSIS — J020 Streptococcal pharyngitis: Secondary | ICD-10-CM

## 2016-05-08 MED ORDER — DEXAMETHASONE SODIUM PHOSPHATE 10 MG/ML IJ SOLN
INTRAMUSCULAR | Status: AC
  Filled 2016-05-08: qty 1

## 2016-05-08 MED ORDER — AMOXICILLIN 875 MG PO TABS: 875.0000 mg | ORAL_TABLET | Freq: Two times a day (BID) | ORAL | 0 refills | Status: DC

## 2016-05-08 MED ORDER — DEXAMETHASONE SODIUM PHOSPHATE 10 MG/ML IJ SOLN
10.0000 mg | Freq: Once | INTRAMUSCULAR | Status: AC
  Administered 2016-05-08: 10 mg via INTRAMUSCULAR

## 2016-05-08 NOTE — ED Provider Notes (Signed)
[facility]    CSN: [id number] Arrival date & time: 05/08/16  1651     History   Chief Complaint Chief Complaint  Patient presents with  . Sore Throat  . Fever    HPI Faith [name] is a 26 y.o. female.   HPI  She is a 26 year old woman here for evaluation of sore throat and fever. Symptoms started suddenly last night with sore throat, worse with swallowing, and fever of 101. Today, she reports minimal nasal congestion and a slight sneeze. No cough. She is able to tolerate liquids, but it is quite painful to swallow. She works as a Building surveyordaycare teacher.  Past Medical History:  Diagnosis Date  . Chlamydia   . Hypertension   . Infection    UTI  . Urinary tract infection   . Vaginal Pap smear, abnormal    f/u was ok    Patient Active Problem List   Diagnosis Date Noted  . Subchorionic hemorrhage in first trimester 02/13/2016  . Abnormal Pap smear of vagina 01/31/2016  . Active labor 08/07/2013  . VBAC, delivered, current hospitalization 08/07/2013  . VBAC, delivered 08/07/2013    Past Surgical History:  Procedure Laterality Date  . CESAREAN SECTION    . DILATION AND EVACUATION N/A 10/04/2012   Procedure: DILATATION AND EVACUATION;  Surgeon: Lesly DukesKelly H Leggett, MD;  Location: [facility];  Service: Gynecology;  Laterality: N/A;    OB History    Gravida Para Term Preterm AB Living   5 3 3  0 1 3   SAB TAB Ectopic Multiple Live Births   1 0 0 0 3       Home Medications    Prior to Admission medications   Medication Sig Start Date End Date Taking? Authorizing Provider  amoxicillin (AMOXIL) 875 MG tablet Take 1 tablet (875 mg total) by mouth 2 (two) times daily. 05/08/16   Charm RingsErin J Maree Ainley, MD    Family History Family History  Problem Relation Age of Onset  . Diabetes Sister   . Hypertension Paternal Grandmother   . Cancer Paternal Grandfather   . Anesthesia problems Neg Hx   . Hearing loss Neg Hx     Social History Social History  Substance Use Topics   . Smoking status: Former Smoker    Packs/day: 0.25    Quit date: 04/05/2015  . Smokeless tobacco: Never Used  . Alcohol use Yes     Comment: occ     Allergies   Patient has no known allergies.   Review of Systems Review of Systems As in history of present illness  Physical Exam Triage Vital Signs ED Triage Vitals  Enc Vitals Group     BP 05/08/16 1710 123/68     Pulse Rate 05/08/16 1710 99     Resp 05/08/16 1710 17     Temp 05/08/16 1710 98.6 F (37 C)     Temp Source 05/08/16 1710 Oral     SpO2 05/08/16 1710 99 %     Weight 05/08/16 1707 238 lb (108 kg)     Height 05/08/16 1707 5\' 1"  (1.549 m)     Head Circumference --      Peak Flow --      Pain Score 05/08/16 1709 6     Pain Loc --      Pain Edu? --      Excl. in GC? --    No data found.   Updated Vital Signs BP 123/68 (BP Location: Left  Arm)   Pulse 99   Temp 98.6 F (37 C) (Oral)   Resp 17   Ht 5\' 1"  (1.549 m)   Wt 238 lb (108 kg)   LMP 05/07/2016   SpO2 99%   Breastfeeding? Unknown   BMI 44.97 kg/m   Visual Acuity Right Eye Distance:   Left Eye Distance:   Bilateral Distance:    Right Eye Near:   Left Eye Near:    Bilateral Near:     Physical Exam  Constitutional: She is oriented to person, place, and time. She appears well-developed and well-nourished. No distress.  HENT:  Nose: Nose normal.  Mouth/Throat: Oropharyngeal exudate present.  Our pharynx erythematous with punctate exudate on the right tonsil  Neck: Neck supple.  Cardiovascular: Normal rate, regular rhythm and normal heart sounds.   No murmur heard. Pulmonary/Chest: Effort normal and breath sounds normal. No respiratory distress. She has no wheezes. She has no rales.  Lymphadenopathy:    She has cervical adenopathy.  Neurological: She is alert and oriented to person, place, and time.     UC Treatments / Results  Labs (all labs ordered are listed, but only abnormal results are displayed) Labs Reviewed - No data to  display  EKG  EKG Interpretation None       Radiology No results found.  Procedures Procedures (including critical care time)  Medications Ordered in UC Medications  dexamethasone (DECADRON) injection 10 mg (not administered)     Initial Impression / Assessment and Plan / UC Course  I have reviewed the triage vital signs and the nursing notes.  Pertinent labs & imaging results that were available during my care of the patient were reviewed by me and considered in my medical decision making (see chart for details).  Clinical Course     Clinically has strep throat. Will treat with Decadron here and amoxicillin for 10 days. Symptomatic care discussed. Workup provided. Follow-up as needed.  Final Clinical Impressions(s) / UC Diagnoses   Final diagnoses:  Strep pharyngitis    New Prescriptions New Prescriptions   AMOXICILLIN (AMOXIL) 875 MG TABLET    Take 1 tablet (875 mg total) by mouth 2 (two) times daily.     Charm Rings, MD 05/08/16 [phone]

## 2016-05-08 NOTE — Discharge Instructions (Signed)
You likely have strep throat. We gave you a steroid shot today to help with the pain. Take amoxicillin twice a day for 10 days. Saltwater gargles, Chloraseptic Spray, and tea with honey are soothing to the throat. Follow-up as needed.

## 2016-05-08 NOTE — ED Triage Notes (Signed)
Pt. Stated, last night I started with a sore throat and fever.  Pt. Did not take any OTC medication

## 2016-05-12 ENCOUNTER — Encounter (HOSPITAL_COMMUNITY): Payer: Self-pay | Admitting: Emergency Medicine

## 2016-05-12 ENCOUNTER — Ambulatory Visit (HOSPITAL_COMMUNITY)
Admission: EM | Admit: 2016-05-12 | Discharge: 2016-05-12 | Disposition: A | Discharge status: Home / Self Care | Payer: Self-pay | Attending: Emergency Medicine | Admitting: Emergency Medicine

## 2016-05-12 DIAGNOSIS — H1033 Unspecified acute conjunctivitis, bilateral: Secondary | ICD-10-CM

## 2016-05-12 MED ORDER — SODIUM CHLORIDE 0.9 % IN NEBU
INHALATION_SOLUTION | RESPIRATORY_TRACT | Status: AC
  Filled 2016-05-12: qty 3

## 2016-05-12 MED ORDER — TOBRAMYCIN 0.3 % OP SOLN: 1.0000 [drp] | Freq: Four times a day (QID) | OPHTHALMIC | 0 refills | Status: AC

## 2016-05-12 MED ORDER — FLUORESCEIN SODIUM 0.6 MG OP STRP
ORAL_STRIP | OPHTHALMIC | Status: AC
  Filled 2016-05-12: qty 2

## 2016-05-12 NOTE — Discharge Instructions (Signed)
You have pinkeye. Use the eyedrops 4 times a day for the next 5 days. Cold compresses can help the eyes feel more comfortable. Follow-up as needed.
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
## 2016-05-12 NOTE — ED Triage Notes (Signed)
[facility] a [facility] of bilateral eye itching and redness x 3 days.

## 2016-07-14 ENCOUNTER — Ambulatory Visit: Payer: Medicaid Other | Admitting: Obstetrics

## 2016-07-14 ENCOUNTER — Encounter: Payer: Self-pay | Admitting: Obstetrics

## 2016-07-20 ENCOUNTER — Emergency Department (HOSPITAL_COMMUNITY): Payer: No Typology Code available for payment source

## 2016-07-20 ENCOUNTER — Emergency Department (HOSPITAL_COMMUNITY)
Admission: EM | Admit: 2016-07-20 | Discharge: 2016-07-20 | Disposition: A | Discharge status: Home / Self Care | Payer: No Typology Code available for payment source | Attending: Emergency Medicine | Admitting: Emergency Medicine

## 2016-07-20 ENCOUNTER — Encounter (HOSPITAL_COMMUNITY): Payer: Self-pay | Admitting: Emergency Medicine

## 2016-07-20 DIAGNOSIS — Y92410 Unspecified street and highway as the place of occurrence of the external cause: Secondary | ICD-10-CM | POA: Insufficient documentation

## 2016-07-20 DIAGNOSIS — M62838 Other muscle spasm: Secondary | ICD-10-CM | POA: Diagnosis not present

## 2016-07-20 DIAGNOSIS — Y999 Unspecified external cause status: Secondary | ICD-10-CM | POA: Insufficient documentation

## 2016-07-20 DIAGNOSIS — Z79899 Other long term (current) drug therapy: Secondary | ICD-10-CM | POA: Insufficient documentation

## 2016-07-20 DIAGNOSIS — S299XXA Unspecified injury of thorax, initial encounter: Secondary | ICD-10-CM | POA: Diagnosis present

## 2016-07-20 DIAGNOSIS — I10 Essential (primary) hypertension: Secondary | ICD-10-CM | POA: Insufficient documentation

## 2016-07-20 DIAGNOSIS — Z87891 Personal history of nicotine dependence: Secondary | ICD-10-CM | POA: Diagnosis not present

## 2016-07-20 DIAGNOSIS — Y939 Activity, unspecified: Secondary | ICD-10-CM | POA: Diagnosis not present

## 2016-07-20 DIAGNOSIS — S29011A Strain of muscle and tendon of front wall of thorax, initial encounter: Secondary | ICD-10-CM | POA: Diagnosis not present

## 2016-07-20 DIAGNOSIS — R0789 Other chest pain: Secondary | ICD-10-CM

## 2016-07-20 DIAGNOSIS — T148XXA Other injury of unspecified body region, initial encounter: Secondary | ICD-10-CM

## 2016-07-20 MED ORDER — METHOCARBAMOL 500 MG PO TABS: 500.0000 mg | ORAL_TABLET | Freq: Two times a day (BID) | ORAL | 0 refills | Status: DC | PRN
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
## 2016-07-20 NOTE — ED Notes (Signed)
ED Provider at bedside. 

## 2016-07-20 NOTE — Discharge Instructions (Signed)
Take Robaxin (muscle relaxer) as needed for muscle tightness/spasms.  You may also continue taking ibuprofen as needed.  Increase hydration to prevent muscle cramps.  These follow-up with your primary care provider for further discussions of today's visit. If you do not have a primary care provider, please call the clinic listed to schedule a follow-up appointment. Return to the ER for new or worsening symptoms, any additional concerns.

## 2016-07-29 ENCOUNTER — Encounter: Payer: Self-pay | Admitting: Obstetrics

## 2016-07-29 ENCOUNTER — Ambulatory Visit (INDEPENDENT_AMBULATORY_CARE_PROVIDER_SITE_OTHER): Payer: Medicaid Other | Admitting: Obstetrics

## 2016-07-29 ENCOUNTER — Other Ambulatory Visit (HOSPITAL_COMMUNITY)
Admission: RE | Admit: 2016-07-29 | Discharge: 2016-07-29 | Disposition: A | Discharge status: Home / Self Care | Payer: Medicaid Other | Source: Ambulatory Visit | Attending: Obstetrics | Admitting: Obstetrics

## 2016-07-29 VITALS — BP 130/84 | HR 94 | Ht 61.0 in | Wt 218.0 lb

## 2016-07-29 DIAGNOSIS — Z124 Encounter for screening for malignant neoplasm of cervix: Secondary | ICD-10-CM

## 2016-07-29 DIAGNOSIS — Z01419 Encounter for gynecological examination (general) (routine) without abnormal findings: Secondary | ICD-10-CM | POA: Insufficient documentation

## 2016-07-29 DIAGNOSIS — Z113 Encounter for screening for infections with a predominantly sexual mode of transmission: Secondary | ICD-10-CM

## 2016-07-29 DIAGNOSIS — N871 Moderate cervical dysplasia: Secondary | ICD-10-CM | POA: Insufficient documentation

## 2016-07-29 DIAGNOSIS — R300 Dysuria: Secondary | ICD-10-CM

## 2016-07-29 DIAGNOSIS — Z3009 Encounter for other general counseling and advice on contraception: Secondary | ICD-10-CM

## 2016-07-29 DIAGNOSIS — Z3045 Encounter for surveillance of transdermal patch hormonal contraceptive device: Secondary | ICD-10-CM

## 2016-07-29 LAB — POCT URINALYSIS DIPSTICK
BILIRUBIN UA: NEGATIVE
Glucose, UA: NEGATIVE
Ketones, UA: NEGATIVE
LEUKOCYTES UA: NEGATIVE
NITRITE UA: NEGATIVE
Protein, UA: NEGATIVE
RBC UA: NEGATIVE
Spec Grav, UA: 1.01 (ref 1.030–1.035)
Urobilinogen, UA: 0.2 (ref ?–2.0)
pH, UA: 6.5 (ref 5.0–8.0)

## 2016-07-29 MED ORDER — NORELGESTROMIN-ETH ESTRADIOL 150-35 MCG/24HR TD PTWK: 1.0000 | MEDICATED_PATCH | TRANSDERMAL | 12 refills | Status: DC

## 2016-07-29 NOTE — Progress Notes (Signed)
Subjective:        Faith [name] is a 26 y.o. female here for a routine exam.  Current complaints: Burning with urination..    Personal health questionnaire:  Is patient Ashkenazi Jewish, have a family history of breast and/or ovarian cancer: no Is there a family history of uterine cancer diagnosed at age < 24, gastrointestinal cancer, urinary tract cancer, family member who is a Personnel officer syndrome-associated carrier: no Is the patient overweight and hypertensive, family history of diabetes, personal history of gestational diabetes, preeclampsia or PCOS: no Is patient over 59, have PCOS,  family history of premature CHD under age 34, diabetes, smoke, have hypertension or peripheral artery disease:  no At any time, has a partner hit, kicked or otherwise hurt or frightened you?: no Over the past 2 weeks, have you felt down, depressed or hopeless?: no Over the past 2 weeks, have you felt little interest or pleasure in doing things?:no   Gynecologic History Patient's last menstrual period was 07/23/2016. Contraception: none Last Pap: 2017. Results were: abnormal Last mammogram: n/a. Results were: n/a  Obstetric History OB History  Gravida Para Term Preterm AB Living  4 3 3  0 1 3  SAB TAB Ectopic Multiple Live Births  0 1 0 0 3    # Outcome Date GA Lbr Len/2nd Weight Sex Delivery Anes PTL Lv  4 TAB 03/12/16     TAB     3 Term 08/07/13 [redacted]w[redacted]d 08:46 / 00:09 8 lb 1.5 oz (3.671 kg) M VBAC EPI  LIV  2 Term 01/21/08    F Vag-Spont   LIV     Birth Comments: no care- did not know she was pregnant  1 Term 12/21/06    F CS-LTranv  N LIV     Birth Comments: BP      Past Medical History:  Diagnosis Date  . Chlamydia   . Hypertension   . Infection    UTI  . Urinary tract infection   . Vaginal Pap smear, abnormal    f/u was ok    Past Surgical History:  Procedure Laterality Date  . CESAREAN SECTION    . DILATION AND EVACUATION N/A 10/04/2012   Procedure: DILATATION AND  EVACUATION;  Surgeon: Lesly Dukes, MD;  Location: [facility];  Service: Gynecology;  Laterality: N/A;     Current Outpatient Prescriptions:  .  amoxicillin (AMOXIL) 875 MG tablet, Take 1 tablet (875 mg total) by mouth 2 (two) times daily. (Patient not taking: Reported on 07/29/2016), Disp: 20 tablet, Rfl: 0 .  methocarbamol (ROBAXIN) 500 MG tablet, Take 1 tablet (500 mg total) by mouth 2 (two) times daily as needed for muscle spasms. (Patient not taking: Reported on 07/29/2016), Disp: 20 tablet, Rfl: 0 No Known Allergies  Social History  Substance Use Topics  . Smoking status: Former Smoker    Packs/day: 0.25    Quit date: 04/05/2015  . Smokeless tobacco: Never Used  . Alcohol use No     Comment: occ    Family History  Problem Relation Age of Onset  . Diabetes Sister   . Hypertension Paternal Grandmother   . Cancer Paternal Grandfather   . Anesthesia problems Neg Hx   . Hearing loss Neg Hx       Review of Systems  Constitutional: negative for fatigue and weight loss Respiratory: negative for cough and wheezing Cardiovascular: negative for chest pain, fatigue and palpitations Gastrointestinal: negative for abdominal pain and change in bowel habits Musculoskeletal:negative  for myalgias Neurological: negative for gait problems and tremors Behavioral/Psych: negative for abusive relationship, depression Endocrine: negative for temperature intolerance    Genitourinary:negative for abnormal menstrual periods, genital lesions, hot flashes, sexual problems and vaginal discharge Integument/breast: negative for breast lump, breast tenderness, nipple discharge and skin lesion(s)    Objective:       BP 130/84   Pulse 94   Ht 5\' 1"  (1.549 m)   Wt 218 lb (98.9 kg)   LMP 07/23/2016   BMI 41.19 kg/m  General:   alert  Skin:   no rash or abnormalities  Lungs:   clear to auscultation bilaterally  Heart:   regular rate and rhythm, S1, S2 normal, no murmur, click, rub or gallop   Breasts:   normal without suspicious masses, skin or nipple changes or axillary nodes  Abdomen:  normal findings: no organomegaly, soft, non-tender and no hernia  Pelvis:  External genitalia: normal general appearance Urinary system: urethral meatus normal and bladder without fullness, nontender Vaginal: normal without tenderness, induration or masses Cervix: normal appearance Adnexa: normal bimanual exam Uterus: anteverted and non-tender, normal size   Lab Review Urine pregnancy test Labs reviewed yes Radiologic studies reviewed yes  50% of 20 min visit spent on counseling and coordination of care.    Assessment:    Healthy female exam.    Contraceptive Counseling and Advice.  Wants Ortho Evra Patch.   Plan:   Ortho Evra Patch Rx  Education reviewed: depression evaluation, low fat, low cholesterol diet, safe sex/STD prevention, self breast exams and weight bearing exercise. Contraception: Ortho-Evra patches weekly. Follow up in: 1 year.   No orders of the defined types were placed in this encounter.  Orders Placed This Encounter  Procedures  . Hepatitis C antibody  . Hepatitis B surface antigen  . HIV antibody  . RPR     Patient ID: Faith [name], female   DOB: [birth year], 26 y.o.   MRN: [id number]

## 2016-07-29 NOTE — Progress Notes (Signed)
Pt presents for annual, pap, and STD screening. Pt intermittent dysuria. Hx of abnormal pap smear 2017.

## 2016-07-30 LAB — RPR: RPR: NONREACTIVE

## 2016-07-30 LAB — HEPATITIS C ANTIBODY: Hep C Virus Ab: <0.1 s/co ratio (ref 0.0–0.9)

## 2016-07-30 LAB — HEPATITIS B SURFACE ANTIGEN: Hepatitis B Surface Ag: NEGATIVE

## 2016-07-30 LAB — HIV ANTIBODY (ROUTINE TESTING W REFLEX): HIV SCREEN 4TH GENERATION: NONREACTIVE

## 2016-07-31 ENCOUNTER — Other Ambulatory Visit: Payer: Self-pay | Admitting: Obstetrics

## 2016-07-31 DIAGNOSIS — N760 Acute vaginitis: Principal | ICD-10-CM

## 2016-07-31 DIAGNOSIS — B9689 Other specified bacterial agents as the cause of diseases classified elsewhere: Secondary | ICD-10-CM

## 2016-07-31 LAB — CERVICOVAGINAL ANCILLARY ONLY
Bacterial vaginitis: POSITIVE — AB
CANDIDA VAGINITIS: NEGATIVE
Chlamydia: NEGATIVE
NEISSERIA GONORRHEA: NEGATIVE
TRICH (WINDOWPATH): NEGATIVE

## 2016-07-31 MED ORDER — METRONIDAZOLE 500 MG PO TABS: 500.0000 mg | ORAL_TABLET | Freq: Two times a day (BID) | ORAL | 2 refills | Status: DC

## 2016-08-04 LAB — CYTOLOGY - PAP: DIAGNOSIS: HIGH — AB

## 2016-08-05 ENCOUNTER — Other Ambulatory Visit: Payer: Self-pay | Admitting: *Deleted

## 2016-08-05 ENCOUNTER — Other Ambulatory Visit: Payer: Self-pay | Admitting: Obstetrics

## 2016-08-05 DIAGNOSIS — B9689 Other specified bacterial agents as the cause of diseases classified elsewhere: Secondary | ICD-10-CM

## 2016-08-05 DIAGNOSIS — N760 Acute vaginitis: Principal | ICD-10-CM

## 2016-08-19 ENCOUNTER — Encounter: Payer: Medicaid Other | Admitting: Obstetrics

## 2016-08-25 ENCOUNTER — Telehealth: Payer: Self-pay

## 2016-08-25 NOTE — Telephone Encounter (Signed)
Spoke with patient to cancel colpo appointment due to patient have family planning medicaid. Advised pt that we will refer her to the [facility] program and someone will call her from that office.

## 2016-08-28 ENCOUNTER — Encounter: Payer: Medicaid Other | Admitting: Obstetrics and Gynecology

## 2016-10-21 ENCOUNTER — Encounter (HOSPITAL_COMMUNITY): Payer: Self-pay | Admitting: *Deleted

## 2016-10-21 ENCOUNTER — Ambulatory Visit (HOSPITAL_COMMUNITY)
Admission: RE | Admit: 2016-10-21 | Discharge: 2016-10-21 | Disposition: A | Discharge status: Home / Self Care | Payer: Self-pay | Source: Ambulatory Visit | Attending: Obstetrics and Gynecology | Admitting: Obstetrics and Gynecology

## 2016-10-21 VITALS — BP 114/72 | Ht 61.0 in | Wt 218.4 lb

## 2016-10-21 DIAGNOSIS — Z1239 Encounter for other screening for malignant neoplasm of breast: Secondary | ICD-10-CM

## 2016-10-21 DIAGNOSIS — R87613 High grade squamous intraepithelial lesion on cytologic smear of cervix (HGSIL): Secondary | ICD-10-CM

## 2016-10-21 NOTE — Progress Notes (Signed)
[facility] due to having an abnormal Pap smear 07/29/2016 that a colposcopy is recommended for follow up.  Pap Smear:  Pap smear not completed today. [facility]. [facility] for a [facility]ps[facility]. Appointment scheduled for Tuesday, October 28, 2016 at 1500. Per patient has a history of one other abnormal Pap smear in March 2017 that a colposcopy and cryotherapy was completed for follow up. Last Pap smear result is in EPIC.  Physical exam: Breasts Breasts symmetrical. No skin abnormalities bilateral breasts. No nipple retraction bilateral breasts. No nipple discharge bilateral breasts. No lymphadenopathy. No lumps palpated bilateral breasts. No complaints of pain or tenderness on exam. Screening mammogram recommended at age 26 unless clinically indicated prior.  Pelvic/Bimanual No Pap smear completed today since last Pap smear was 07/29/2016. Pap smear not indicated per BCCCP guidelines.   Smoking History: Patient has never smoked.  Patient Navigation: Patient education provided. Access to services provided for patient through [facility].

## 2016-10-21 NOTE — Addendum Note (Signed)
Encounter addended by: Priscille HeidelbergBrannock, Takerra Lupinacci P, RN on: 10/21/2016  3:27 PM<BR>    Actions taken: Sign clinical note

## 2016-10-21 NOTE — Patient Instructions (Addendum)
Explained breast self awareness with Faith [name]. Patient did not need a Pap smear today due to last Pap smear was 07/29/2016. Explained the colposcopy the recommended follow up for her abnormal Pap smear. [facility] for a colps[facility]. Appointment scheduled for Tuesday, October 28, 2016 at 1500. Patient aware of appointment and will be there. Let patient know she will need a screening mammogram at age 26 unless clinically indicated. Norton PastelShaundra R [name] verbalized understanding.  Faith [name], [name], RN 3:20 PM

## 2016-10-22 ENCOUNTER — Encounter (HOSPITAL_COMMUNITY): Payer: Self-pay | Admitting: *Deleted

## 2016-10-28 ENCOUNTER — Ambulatory Visit (INDEPENDENT_AMBULATORY_CARE_PROVIDER_SITE_OTHER): Payer: Self-pay | Admitting: Obstetrics & Gynecology

## 2016-10-28 ENCOUNTER — Inpatient Hospital Stay (HOSPITAL_COMMUNITY): Admission: RE | Admit: 2016-10-28 | Payer: Self-pay | Source: Ambulatory Visit

## 2016-10-28 ENCOUNTER — Other Ambulatory Visit (HOSPITAL_COMMUNITY)
Admission: RE | Admit: 2016-10-28 | Discharge: 2016-10-28 | Disposition: A | Discharge status: Home / Self Care | Payer: Medicaid Other | Source: Ambulatory Visit | Attending: Obstetrics & Gynecology | Admitting: Obstetrics & Gynecology

## 2016-10-28 ENCOUNTER — Encounter: Payer: Self-pay | Admitting: Obstetrics & Gynecology

## 2016-10-28 VITALS — BP 108/66 | HR 106 | Wt 216.9 lb

## 2016-10-28 DIAGNOSIS — Z124 Encounter for screening for malignant neoplasm of cervix: Secondary | ICD-10-CM

## 2016-10-28 DIAGNOSIS — Z3202 Encounter for pregnancy test, result negative: Secondary | ICD-10-CM

## 2016-10-28 DIAGNOSIS — R87613 High grade squamous intraepithelial lesion on cytologic smear of cervix (HGSIL): Secondary | ICD-10-CM

## 2016-10-28 DIAGNOSIS — Z01411 Encounter for gynecological examination (general) (routine) with abnormal findings: Secondary | ICD-10-CM | POA: Insufficient documentation

## 2016-10-28 LAB — POCT PREGNANCY, URINE: Preg Test, Ur: NEGATIVE

## 2016-10-28 NOTE — Patient Instructions (Signed)
Cervical Conization °Cervical conization (cone biopsy) is a procedure in which a cone-shaped portion of the cervix is cut out so that it can be examined under a microscope. The procedure is done to check for cancer cells or cells that might turn into cancer (precancerous cells). You may have this procedure if: °· You have abnormal bleeding from your cervix. °· You had an abnormal Pap test. °· Something abnormal was seen on your cervix during an exam. ° °This procedure is performed in either a health care provider’s office or in an operating room. °Tell a health care provider about: °· Any allergies you have. °· All medicines you are taking, including vitamins, herbs, eye drops, creams, and over-the-counter medicines. °· Any problems you or family members have had with the use of anesthetic medicines. °· Any blood disorders you have. °· Any surgeries you have had. °· Any medical conditions you have. °· Your smoking habits. °· When you normally have your period. °· Whether you are pregnant or may be pregnant. °What are the risks? °Generally, this is a safe procedure. However, problems may occur, including: °· Heavy bleeding for several days or weeks after the procedure. °· Allergic reactions to medicines or dyes. °· Increased risk of preterm labor in future pregnancies. °· Infection (rare). °· Damage to the cervix or other structures or organs (rare). ° °What happens before the procedure? °Staying hydrated °Follow instructions from your health care provider about hydration, which may include: °· Up to 2 hours before the procedure - you may continue to drink clear liquids, such as water, clear fruit juice, black coffee, and plain tea. ° °Eating and drinking restrictions °Follow instructions from your health care provider about eating and drinking, which may include: °· 8 hours before the procedure - stop eating heavy meals or foods such as meat, fried foods, or fatty foods. °· 6 hours before the procedure - stop eating  light meals or foods, such as toast or cereal. °· 6 hours before the procedure - stop drinking milk or drinks that contain milk. °· 2 hours before the procedure - stop drinking clear liquids. ° °General instructions °· Do not douche, have sex, use tampons, or use any vaginal medicines before the procedure as told by your health care provider. °· You may be asked to empty your bladder and bowel right before the procedure. °· Ask your health care provider about: °? Changing or stopping your normal medicines. This is important if you take diabetes medicines or blood thinners. °? Taking medicines such as aspirin and ibuprofen. These medicines can thin your blood. Do not take these medicines before your procedure if your doctor tells you not to. °· Plan to have someone take you home from the hospital or clinic. °What happens during the procedure? °· To reduce your risk of infection: °? Your health care team will wash or sanitize their hands. °? Your skin will be washed with soap. °? Hair may be removed from the surgical area. °· You will undress from the waist down and be given a gown to wear. °· You will lie on an examining table and put your feet in stirrups. °· An IV tube will be inserted into one of your veins. °· You will be given one or more of the following: °? A medicine to help you relax (sedative). °? A medicine to numb the area (local anesthetic). °? A medicine to make you fall asleep (general anesthetic). °? A medicine that numbs the cervix (cervical block). °·   A lubricated device called a speculum will be inserted into your vagina. It will be used to spread open the walls of the vagina so your health care provider can see the inside of the vagina and cervix better.  An instrument that has a magnifying lens and a light (colposcope) will let your health care provider examine the cervix more closely.  Your health care provider will apply a solution to your cervix. This turns abnormal areas a pale  color.  A tissue sample will be removed from the cervix using one of the following methods: ? The cold knife method. In this method, the tissue is cut out with a knife (scalpel). ? The loop electrosurgical excision procedure (LEEP) method. In this method, the tissue is cut out with a thin wire that can burn (cauterize) the tissue with an electrical current. ? Laser treatment method. In this method, the tissue is cut out and then cauterized with a laser beam to prevent bleeding.  Your health care provider will apply a paste over the biopsy areas to help control bleeding.  The tissue sample will be examined under a microscope. The procedure may vary among health care providers and hospitals. What happens after the procedure?  Your blood pressure, heart rate, breathing rate, and blood oxygen level will be monitored often until the medicines you were given have worn off.  If you were given a local anesthetic, you will rest at the clinic or hospital until you are stable and feel ready to go home.  If you were given a general anesthetic, you may be monitored for a longer period of time.  You may have some cramping.  You may have bloody discharge or light to moderate bleeding.  You may have dark discharge coming from your vagina. This is from the paste used on the cervix to prevent bleeding. Summary  Cervical conization is a procedure in which a cone-shaped portion of the cervix is cut out so that it can be examined under a microscope.  The procedure is done to check for cancer cells or cells that might turn into cancer (precancerous cells). This information is not intended to replace advice given to you by your health care provider. Make sure you discuss any questions you have with your health care provider. Document Released: 01/29/2005 Document Revised: 04/23/2016 Document Reviewed: 04/23/2016 Elsevier Interactive Patient Education  2017 Elsevier Inc. Colposcopy, Care After This sheet  gives you information about how to care for yourself after your procedure. Your health care provider may also give you more specific instructions. If you have problems or questions, contact your health care provider. What can I expect after the procedure? If you had a colposcopy without a biopsy, you can expect to feel fine right away, but you may have some spotting for a few days. You can go back to your normal activities. If you had a colposcopy with a biopsy, it is common to have:  Soreness and pain. This may last for a few days.  Light-headedness.  Mild vaginal bleeding or dark-colored, grainy discharge. This may last for a few days. The discharge may be due to a solution that was used during the procedure. You may need to wear a sanitary pad during this time.  Spotting for at least 48 hours after the procedure.  Follow these instructions at home:  Take over-the-counter and prescription medicines only as told by your health care provider. Talk with your health care provider about what type of over-the-counter pain medicine and  prescription medicine you can start taking again. It is especially important to talk with your health care provider if you take blood-thinning medicine.  Do not drive or use heavy machinery while taking prescription pain medicine.  For at least 3 days after your procedure, or as long as told by your health care provider, avoid: ? Douching. ? Using tampons. ? Having sexual intercourse.  Continue to use birth control (contraception).  Limit your physical activity for the first day after the procedure as told by your health care provider. Ask your health care provider what activities are safe for you.  It is up to you to get the results of your procedure. Ask your health care provider, or the department performing the procedure, when your results will be ready.  Keep all follow-up visits as told by your health care provider. This is important. Contact a health  care provider if:  You develop a skin rash. Get help right away if:  You are bleeding heavily from your vagina or you are passing blood clots. This includes using more than one sanitary pad per hour for 2 hours in a row.  You have a fever or chills.  You have pelvic pain.  You have abnormal, yellow-colored, or bad-smelling vaginal discharge. This could be a sign of infection.  You have severe pain or cramps in your lower abdomen that do not get better with medicine.  You feel light-headed or dizzy, or you faint. Summary  If you had a colposcopy without a biopsy, you can expect to feel fine right away, but you may have some spotting for a few days. You can go back to your normal activities.  If you had a colposcopy with a biopsy, you may notice mild pain and spotting for 48 hours after the procedure.  Avoid douching, using tampons, and having sexual intercourse for 3 days after the procedure or as long as told by your health care provider.  Contact your health care provider if you have bleeding, severe pain, or signs of infection. This information is not intended to replace advice given to you by your health care provider. Make sure you discuss any questions you have with your health care provider. Document Released: 02/09/2013 Document Revised: 12/07/2015 Document Reviewed: 12/07/2015 Elsevier Interactive Patient Education  2018 [employer].

## 2016-10-28 NOTE — Addendum Note (Signed)
Addended by: Willodean RosenthalHARRAWAY-SMITH, Rosann Gorum on: 10/28/2016 04:01 PM   Modules accepted: Orders

## 2016-10-28 NOTE — Progress Notes (Signed)
Patient given informed consent, signed copy in the chart, time out was performed.  Placed in lithotomy position. Cervix viewed with speculum and colposcope after application of acetic acid.  07/29/2016 Satisfactory for evaluation endocervical/transformation zone component PRESENT.     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    Material Submitted CervicoVaginal Pap [ThinPrep Imaged]     Colposcopy adequate?  yes Acetowhite lesions? Yes. 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 Punctation? 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  yes Biopsies? Yes x 3 at 3 o'clock, 9 o'clock and 11 o'clock ECC? yes   Patient was given post procedure instructions. I reviewed the findings of her study. She will return in 2 weeks for results.  Given the abnormal appearance of her colpo I have asked her to watch the LEEP video. I have reviewed the procedure with her.      Shaterrica Territo L. Harraway-Smith, M.D., Evern CoreFACOG

## 2016-10-28 NOTE — Addendum Note (Signed)
Addended by: Faythe CasaBELLAMY, JEANETTA M on: 10/28/2016 04:04 PM   Modules accepted: Orders

## 2016-10-30 LAB — CYTOLOGY - PAP: Diagnosis: HIGH — AB

## 2016-11-02 HISTORY — PX: LEEP: SHX91

## 2016-11-03 ENCOUNTER — Telehealth: Payer: Self-pay | Admitting: General Practice

## 2016-11-03 NOTE — Telephone Encounter (Signed)
Called patient and informed her of results and recommendation. Patient verbalized understanding & asked if she needed to stop eating/drinking at a certain time or if she would be able to work the next day. Told patient she can eat/drink whenever, there aren't restrictions and she will have no problem working the next day. Patient verbalized understanding & had no questions

## 2016-11-03 NOTE — Telephone Encounter (Signed)
-----   Message from Willodean Rosenthalarolyn Harraway-Smith, MD sent at 11/02/2016 11:30 AM EDT ----- Please call pt. Notify her that she needs a LEEP.  She has already seen the LEEP video.  Thx, clh-S

## 2016-11-11 ENCOUNTER — Other Ambulatory Visit (HOSPITAL_COMMUNITY)
Admission: RE | Admit: 2016-11-11 | Discharge: 2016-11-11 | Disposition: A | Discharge status: Home / Self Care | Payer: Medicaid Other | Source: Ambulatory Visit | Attending: Obstetrics & Gynecology | Admitting: Obstetrics & Gynecology

## 2016-11-11 ENCOUNTER — Ambulatory Visit (INDEPENDENT_AMBULATORY_CARE_PROVIDER_SITE_OTHER): Payer: Self-pay | Admitting: Obstetrics & Gynecology

## 2016-11-11 VITALS — BP 112/59 | HR 75 | Ht 61.0 in | Wt 217.8 lb

## 2016-11-11 DIAGNOSIS — N879 Dysplasia of cervix uteri, unspecified: Secondary | ICD-10-CM

## 2016-11-11 LAB — POCT PREGNANCY, URINE: Preg Test, Ur: NEGATIVE

## 2016-11-11 NOTE — Patient Instructions (Signed)
Cervical Conization, Care After  This sheet gives you information about how to care for yourself after your procedure. Your health care provider may also give you more specific instructions. If you have problems or questions, contact your health care provider.  What can I expect after the procedure?  After the procedure, it is common to have:   A groggy feeling, if you were given medicine to make you fall asleep (general anesthetic).   Cramps that feel similar to menstrual cramps.   Bloody discharge or light to moderate bleeding.   Dark discharge. This discharge may look similar to coffee grounds. This is from the paste that was applied to the cervix to control bleeding.    Follow these instructions at home:  Medicines   Take over-the-counter and prescription medicines only as told by your health care provider.   Do not take aspirin until your health care provider says it is okay. Aspirin can cause bleeding.   If you are taking pain medicine:   You may need to prevent or treat constipation. To do this, your health care provider may recommend that you:   Drink enough fluid to keep your urine clear or pale yellow.   Take over-the-counter or prescription medicines.   Eat foods that are high in fiber, such as fresh fruits and vegetables, whole grains, bran, and beans.   Limit foods that are high in fat and processed sugars, such as fried and sweet foods.   Do not drive or use heavy machinery.  General instructions   You may resume your normal diet unless your health care provider advises you not to do so.   Take showers for the first week. Do not take baths, swim, or use hot tubs until your health care provider says it is okay.   Do not douche, use tampons, or have sex until your health care provider says it is okay.   Avoid activities that require great effort, such as exercises and heavy lifting, for at least 7-14 days.   Keep all follow-up visits as told by your health care provider. This is  important.  Contact a health care provider if:   You develop a rash.   You are dizzy or lightheaded.   You feel nauseous or you vomit.   You develop a bad smelling discharge from your vagina.  Get help right away if:   You have blood clots or bleeding that is heavier than a normal period. Bleeding that soaks a pad in less than 1 hour is considered heavy bleeding.   You have a fever.   You have increasing cramps.   You faint.   You have pain when you urinate.   You have severe or worsening pain.   Your pain is not relieved when you take medicine.   You have bloody urine.   You vomit.  Summary   After the procedure, it is common to have cramps and dark or bloody discharge from your vagina.   Do not douche, use tampons, or have sex until your health care provider says it is okay.   Follow all other activity restrictions as told by your health care provider.  This information is not intended to replace advice given to you by your health care provider. Make sure you discuss any questions you have with your health care provider.  Document Released: 04/21/2005 Document Revised: 04/23/2016 Document Reviewed: 04/23/2016  Elsevier Interactive Patient Education  2017 Elsevier Inc.

## 2016-11-11 NOTE — Progress Notes (Signed)
Pap smear and colposcopy reviewed.   Pap   2wk ago  Adequacy Satisfactory for evaluation endocervical/transformation zone component PRESENT.    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    Material Submitted CervicoVaginal Pap [ThinPrep Imaged]      Colpo Biopsy/ ECC Diagnosis 1. Cervix, biopsy, 3, 9, 11 o'clock - HIGH GRADE SQUAMOUS INTRAEPITHELIAL LESION, CIN-III (SEVERE DYSPLASIA/CIS). - SEE COMMENT. 2. Endocervix, curettage - HIGH GRADE SQUAMOUS INTRAEPITHELIAL LESION, CIN-III (SEVERE DYSPLASIA/CIS). - BENIGN ENDOCERVICAL-TYPE MUCOSA.  Risks, benefits, alternatives, and limitations of procedure explained to patient, including pain, bleeding, infection, failure to remove abnormal tissue and failure to cure dysplasia, need for repeat procedures, damage to pelvic organs, cervical incompetence.  Role of HPV,cervical dysplasia and need for close followup was empasized. Informed written consent was obtained. All questions were answered. Time out performed.  Procedure: The patient was placed in lithotomy position and the bivalved coated speculum was placed in the patient's vagina. A grounding pad placed on the patient. Local anesthesia was administered via an intracervical block using 10cc of 2% Lidocaine with epinephrine. The suction was turned on and the Large 1X Fisher Cone Biopsy Excisor on 50 Watts of cutting current was used to excise the area of decreased uptake and excise the entire transformation zone. Excellent hemostasis was achieved using roller ball coagulation set at 60 Watts coagulation current. Monsel's solution was then applied and excellent hemostasis was noted.  The speculum was removed from the vagina. Specimens were sent to pathology. The patient tolerated the procedure well. Post-operative instructions given to patient, including instruction to seek medical attention for persistent bright red bleeding, fever, abdominal/pelvic pain,  dysuria, nausea or vomiting. She was also told about the possibility of having copious yellow to black tinged discharge. She was counseled to avoid anything in the vagina (sex/douching/tampons) for 4 weeks. She has a  2 week post-operative check to review results and assess wound healing. Follow up in 4 months for repeat pap or as needed.  Eldridge Marcott L. Harraway-Smith, M.D., Evern CoreFACOG

## 2016-11-14 ENCOUNTER — Encounter: Payer: Self-pay | Admitting: Obstetrics & Gynecology

## 2016-11-17 ENCOUNTER — Telehealth: Payer: Self-pay | Admitting: Obstetrics & Gynecology

## 2016-11-17 NOTE — Telephone Encounter (Signed)
Calling  About her Leep she had done, state that she's in a lot of pain and she now bleeding

## 2016-11-18 ENCOUNTER — Telehealth: Payer: Self-pay

## 2016-11-18 ENCOUNTER — Ambulatory Visit (INDEPENDENT_AMBULATORY_CARE_PROVIDER_SITE_OTHER): Payer: Self-pay | Admitting: Obstetrics and Gynecology

## 2016-11-18 ENCOUNTER — Encounter: Payer: Self-pay | Admitting: Obstetrics and Gynecology

## 2016-11-18 ENCOUNTER — Encounter: Payer: Self-pay | Admitting: Family Medicine

## 2016-11-18 ENCOUNTER — Other Ambulatory Visit (HOSPITAL_COMMUNITY)
Admission: RE | Admit: 2016-11-18 | Discharge: 2016-11-18 | Disposition: A | Discharge status: Home / Self Care | Payer: Medicaid Other | Source: Ambulatory Visit | Attending: Obstetrics and Gynecology | Admitting: Obstetrics and Gynecology

## 2016-11-18 VITALS — BP 135/66 | HR 79 | Ht 61.0 in | Wt 221.7 lb

## 2016-11-18 DIAGNOSIS — N898 Other specified noninflammatory disorders of vagina: Secondary | ICD-10-CM | POA: Diagnosis not present

## 2016-11-18 MED ORDER — KETOROLAC TROMETHAMINE 10 MG PO TABS: 10.0000 mg | ORAL_TABLET | Freq: Four times a day (QID) | ORAL | 0 refills | Status: DC | PRN

## 2016-11-18 MED ORDER — METRONIDAZOLE 500 MG PO TABS: 500.0000 mg | ORAL_TABLET | Freq: Two times a day (BID) | ORAL | 0 refills | Status: AC

## 2016-11-18 NOTE — Telephone Encounter (Signed)
Patient called the office stating that she had started having some bleeding and pain on 11/16/16 after her Leep procedure.Called patient and patient states that she is having sharp intensive pain. Patient stated that she is not having her period and she notices the bright red blood after wiping and on her pad. Per Vonzella NippleJulie Wenzel PA-C, patient should come into the office to be seen. Patient is scheduled to come in today at 3:40. Patient verbalized understanding and had no questions.

## 2016-11-18 NOTE — Progress Notes (Signed)
Obstetrics and Gynecology Return Patient Evaluation  Appointment Date: 11/18/2016  OB[facility]N Clinic: [facility]  Referring Provider: self  Chief Complaint: lower belly/back pain  History of Present Illness: Faith [name] is a 26 y.o. African-American [id number] (Patient's last menstrual period was 11/05/2016 (approximate).), seen for the above chief complaint.  Patient states she's had some b/l lower belly and back cramping and discomfort.  See RN note, but patient called earlier today and was added on for a visit.  Patient states bleeding is minimal and is improving.  She states the above noted discomfort pre dates the LEEP and had some even before the colposcopy.  She denies any fevers, chills, dysuria, GI s/s and has had nothing per vagina since the procedure.  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   Review of Systems:  as noted in the History of Present Illness.    Past Medical History:  Past Medical History:  Diagnosis Date  . Chlamydia   . Hypertension   . Infection    UTI  . Urinary tract infection   . Vaginal Pap smear, abnormal    f/u was ok    Past Surgical History:  Past Surgical History:  Procedure Laterality Date  . CESAREAN SECTION    . COLPOSCOPY    . DILATION AND EVACUATION N/A 10/04/2012   Procedure: DILATATION AND EVACUATION;  Surgeon: Lesly Dukes, MD;  Location: [facility];  Service: Gynecology;  Laterality: N/A;    Past Obstetrical History:  OB History  Gravida Para Term Preterm AB Living  4 3 3  0 1 3  SAB TAB Ectopic Multiple Live Births  0 1 0 0 3    # Outcome Date GA Lbr Len/2nd Weight Sex Delivery Anes PTL Lv  4 TAB 03/12/16     TAB     3 Term 08/07/13 [redacted]w[redacted]d 08:46 / 00:09 8 lb 1.5 oz (3.671 kg) M VBAC EPI  LIV  2 Term 01/21/08    F Vag-Spont   LIV     Birth Comments: no care- did not know she was pregnant  1 Term 12/21/06    F CS-LTranv  N LIV     Birth Comments: BP     Past Gynecological History: As  per HPI.  Social History:  Social History   Social History  . Marital status: Single    Spouse name: N/A  . Number of children: N/A  . Years of education: N/A   Occupational History  . Not on file.   Social History Main Topics  . Smoking status: Former Smoker    Packs/day: 0.25    Quit date: 04/05/2015  . Smokeless tobacco: Never Used  . Alcohol use Yes     Comment: occ  . Drug use: No  . Sexual activity: Yes    Partners: Male    Birth control/ protection: None   Other Topics Concern  . Not on file   Social History Narrative  . No narrative on file    Family History:  Family History  Problem Relation Age of Onset  . Diabetes Sister   . Hypertension Paternal Grandmother   . Cancer Paternal Grandfather   . Anesthesia problems Neg Hx   . Hearing loss Neg Hx     Medications PRN naproxen  Allergies Patient has no known allergies.   Physical Exam:  BP 135/66   Pulse 79   Ht 5\' 1"  (1.549 m)   Wt 221 lb 11.2 oz (  100.6 kg)   LMP 11/05/2016 (Approximate)   Breastfeeding? No   BMI 41.89 kg/m  Body mass index is 41.89 kg/m. General appearance: Well nourished, well developed female in no acute distress.  Respiratory:   Normal respiratory effort Abdomen: positive bowel sounds and no masses, hernias; diffusely non tender to palpation, non distended Neuro/Psych:  Normal mood and affect.  Skin:  Warm and dry.  Lymphatic:  No inguinal lymphadenopathy.   Pelvic exam: is not limited by body habitus EGBUS: within normal limits, Vagina: within normal limits and with no blood in vault,+ white/yellow discharge in the vault, Cervix: well healing LEEP bed with eschar on it, nttp with movement with large q-tip, no bleeding.   Laboratory: none  Radiology: none  Assessment: pt doing well  Plan:  1. Vaginal discharge Will treat for BV with flagyl. STI swab obtained. No e/o cevical cellulitis. D/w pt that since pain predates GYN procedures that sounds like it's most  likely musculoskeletal. toradol sent in and pt told not to take other NSAIDs with use.  - Cervicovaginal ancillary only  RTC 1 month regular after procedure visit already scheduled.  Faith [name], Jr MD Attending [facility](Faculty Practice)

## 2016-11-19 LAB — CERVICOVAGINAL ANCILLARY ONLY
CHLAMYDIA, DNA PROBE: NEGATIVE
Neisseria Gonorrhea: NEGATIVE
Trichomonas: NEGATIVE

## 2016-11-26 ENCOUNTER — Encounter: Payer: Self-pay | Admitting: Obstetrics & Gynecology

## 2016-11-26 ENCOUNTER — Encounter (HOSPITAL_COMMUNITY): Payer: Self-pay | Admitting: *Deleted

## 2016-11-26 ENCOUNTER — Ambulatory Visit (INDEPENDENT_AMBULATORY_CARE_PROVIDER_SITE_OTHER): Payer: Medicaid Other | Admitting: Obstetrics & Gynecology

## 2016-11-26 VITALS — BP 123/54 | Ht 61.0 in | Wt 223.0 lb

## 2016-11-26 DIAGNOSIS — N879 Dysplasia of cervix uteri, unspecified: Secondary | ICD-10-CM | POA: Diagnosis present

## 2016-11-26 NOTE — Progress Notes (Signed)
History:  26 y.o. [id number] here today for post LEEP f/u.  Pt reports bleeding after the LEEP 11/11/2016.  Pt has been taking flagyl. Pt has very light pink spotting that is getting lighter. No wearing a pad.  Very little soreness. Odor resolved and pain improved. Pt is voiding ok.  The following portions of the patient's history were reviewed and updated as appropriate: allergies, current medications, past family history, past medical history, past social history, past surgical history and problem list.  Review of Systems:  Pertinent items are noted in HPI.   Objective:  Physical Exam Blood pressure (!) 123/54, height 5\' 1"  (1.549 m), weight 223 lb (101.2 kg), last menstrual period 11/05/2016.  CONSTITUTIONAL: Well-developed, well-nourished female in no acute distress.  HENT:  Normocephalic, atraumatic EYES: Conjunctivae and EOM are normal. No scleral icterus.  NECK: Normal range of motion SKIN: Skin is warm and dry. No rash noted. Not diaphoretic.No pallor. NEUROLGIC: Alert and oriented to person, place, and time. Normal coordination.   Labs and Imaging 11/11/2016 Diagnosis Cervix, LEEP - FOCAL LOW GRADE SQUAMOUS INTRAEPITHELIAL LESION, CIN-I (MILD DYSPLASIA). - SEE COMMENT. Microscopic Comment p16 and deeper levels were performed. p16 reveals only focal patchy staining. There are areas with reactive changes and hemosiderin suggestive of prior biopsy sites. There is no residual high grade intraepithelial lesion.  Assessment & Plan:  LEEP post procedure completion . Doing well now.  F/u in 2 week for exam of cervix Complete Flagyl course.  Reviewed surg oath with pt  Total face-to-face time with patient was 10 min.  Greater than 50% was spent in counseling and coordination of care with the patient.    Rhett Mutschler L. Harraway-Smith, M.D., Evern CoreFACOG

## 2016-11-26 NOTE — Patient Instructions (Signed)
Cervical Conization, Care After  This sheet gives you information about how to care for yourself after your procedure. Your health care provider may also give you more specific instructions. If you have problems or questions, contact your health care provider.  What can I expect after the procedure?  After the procedure, it is common to have:   A groggy feeling, if you were given medicine to make you fall asleep (general anesthetic).   Cramps that feel similar to menstrual cramps.   Bloody discharge or light to moderate bleeding.   Dark discharge. This discharge may look similar to coffee grounds. This is from the paste that was applied to the cervix to control bleeding.    Follow these instructions at home:  Medicines   Take over-the-counter and prescription medicines only as told by your health care provider.   Do not take aspirin until your health care provider says it is okay. Aspirin can cause bleeding.   If you are taking pain medicine:   You may need to prevent or treat constipation. To do this, your health care provider may recommend that you:   Drink enough fluid to keep your urine clear or pale yellow.   Take over-the-counter or prescription medicines.   Eat foods that are high in fiber, such as fresh fruits and vegetables, whole grains, bran, and beans.   Limit foods that are high in fat and processed sugars, such as fried and sweet foods.   Do not drive or use heavy machinery.  General instructions   You may resume your normal diet unless your health care provider advises you not to do so.   Take showers for the first week. Do not take baths, swim, or use hot tubs until your health care provider says it is okay.   Do not douche, use tampons, or have sex until your health care provider says it is okay.   Avoid activities that require great effort, such as exercises and heavy lifting, for at least 7-14 days.   Keep all follow-up visits as told by your health care provider. This is  important.  Contact a health care provider if:   You develop a rash.   You are dizzy or lightheaded.   You feel nauseous or you vomit.   You develop a bad smelling discharge from your vagina.  Get help right away if:   You have blood clots or bleeding that is heavier than a normal period. Bleeding that soaks a pad in less than 1 hour is considered heavy bleeding.   You have a fever.   You have increasing cramps.   You faint.   You have pain when you urinate.   You have severe or worsening pain.   Your pain is not relieved when you take medicine.   You have bloody urine.   You vomit.  Summary   After the procedure, it is common to have cramps and dark or bloody discharge from your vagina.   Do not douche, use tampons, or have sex until your health care provider says it is okay.   Follow all other activity restrictions as told by your health care provider.  This information is not intended to replace advice given to you by your health care provider. Make sure you discuss any questions you have with your health care provider.  Document Released: 04/21/2005 Document Revised: 04/23/2016 Document Reviewed: 04/23/2016  Elsevier Interactive Patient Education  2017 Elsevier Inc.

## 2016-12-11 ENCOUNTER — Encounter: Payer: Self-pay | Admitting: Obstetrics & Gynecology

## 2016-12-11 ENCOUNTER — Ambulatory Visit (INDEPENDENT_AMBULATORY_CARE_PROVIDER_SITE_OTHER): Payer: Medicaid Other | Admitting: Obstetrics & Gynecology

## 2016-12-11 VITALS — BP 122/76 | HR 72 | Wt 221.6 lb

## 2016-12-11 DIAGNOSIS — N879 Dysplasia of cervix uteri, unspecified: Secondary | ICD-10-CM

## 2016-12-11 NOTE — Progress Notes (Signed)
History:  26 y.o. [id number] here today for her post LEEP check. She denies problems. No discharge, odor. no pelvic pain.    The following portions of the patient's history were reviewed and updated as appropriate: allergies, current medications, past family history, past medical history, past social history, past surgical history and problem list.  Review of Systems:  Pertinent items are noted in HPI.   Objective:  Physical Exam Blood pressure 122/76, pulse 72, weight 221 lb 9.6 oz (100.5 kg). CONSTITUTIONAL: Well-developed, well-nourished female in no acute distress.  HENT:  Normocephalic, atraumatic EYES: Conjunctivae and EOM are normal. No scleral icterus.  NECK: Normal range of motion SKIN: Skin is warm and dry. No rash noted. Not diaphoretic.No pallor. NEUROLGIC: Alert and oriented to person, place, and time. Normal coordination.   Pelvic: Normal appearing external genitalia; normal appearing vaginal mucosa and cervix.  Healing well.  Labs and Imaging 11/11/2016 Diagnosis Cervix, LEEP - FOCAL LOW GRADE SQUAMOUS INTRAEPITHELIAL LESION, CIN-I (MILD DYSPLASIA). - SEE COMMENT. Microscopic Comment p16 and deeper levels were performed. p16 reveals only focal patchy staining. There are areas with reactive changes and hemosiderin suggestive of prior biopsy sites. There is no residual high grade intraepithelial lesion.  Assessment & Plan:  4 week post LEEP check  Pt is doing well. Full return to normal activity. All questions answered  Mirah Nevins L. Harraway-Smith, M.D., Evern CoreFACOG

## 2017-05-25 ENCOUNTER — Emergency Department (HOSPITAL_COMMUNITY)
Admission: EM | Admit: 2017-05-25 | Discharge: 2017-05-25 | Disposition: A | Discharge status: Home / Self Care | Payer: Medicaid Other | Attending: Emergency Medicine | Admitting: Emergency Medicine

## 2017-05-25 ENCOUNTER — Encounter (HOSPITAL_COMMUNITY): Payer: Self-pay | Admitting: Emergency Medicine

## 2017-05-25 DIAGNOSIS — Z79899 Other long term (current) drug therapy: Secondary | ICD-10-CM | POA: Diagnosis not present

## 2017-05-25 DIAGNOSIS — R220 Localized swelling, mass and lump, head: Secondary | ICD-10-CM | POA: Diagnosis present

## 2017-05-25 DIAGNOSIS — B001 Herpesviral vesicular dermatitis: Secondary | ICD-10-CM | POA: Diagnosis not present

## 2017-05-25 DIAGNOSIS — I10 Essential (primary) hypertension: Secondary | ICD-10-CM | POA: Insufficient documentation

## 2017-05-25 DIAGNOSIS — Z87891 Personal history of nicotine dependence: Secondary | ICD-10-CM | POA: Diagnosis not present

## 2017-05-25 MED ORDER — VALACYCLOVIR HCL 1 G PO TABS: 1000.0000 mg | ORAL_TABLET | Freq: Three times a day (TID) | ORAL | 0 refills | Status: AC

## 2017-05-25 NOTE — ED Triage Notes (Signed)
Pt reports bug bite to R lower lip. Concerned that she has herpes.

## 2017-05-25 NOTE — Discharge Instructions (Signed)
You can take abreva, over the counter for cold sores.

## 2017-05-25 NOTE — ED Provider Notes (Signed)
[facility] EMERGENCY DEPARTMENT Provider Note   CSN: [id number] Arrival date & time: 05/25/17  1909     History   Chief Complaint No chief complaint on file.   HPI Faith [name] is a 27 y.o. female.  The history is provided by the patient. No language interpreter was used.    Faith [name] is a 27 y.o. female who presents to the Emergency Department complaining of bump on lip.  About 1 hour prior to ED arrival she noted a stinging and swelling to her right lower lip.  No known injuries or bug bites.  No prior similar symptoms.  She denies any fevers, nausea, vomiting, shortness of breath, systemic symptoms.  Past Medical History:  Diagnosis Date  . Chlamydia   . Hypertension   . Infection    UTI  . Urinary tract infection   . Vaginal Pap smear, abnormal    f/u was ok    Patient Active Problem List   Diagnosis Date Noted  . Abnormal Pap smear of vagina 01/31/2016    Past Surgical History:  Procedure Laterality Date  . CESAREAN SECTION    . COLPOSCOPY    . DILATION AND EVACUATION N/A 10/04/2012   Procedure: DILATATION AND EVACUATION;  Surgeon: Lesly Dukes, MD;  Location: [facility];  Service: Gynecology;  Laterality: N/A;  . LEEP  11/2016    OB History    Gravida Para Term Preterm AB Living   4 3 3  0 1 3   SAB TAB Ectopic Multiple Live Births   0 1 0 0 3       Home Medications    Prior to Admission medications   Medication Sig Start Date End Date Taking? Authorizing Provider  ketorolac (TORADOL) 10 MG tablet Take 1 tablet (10 mg total) by mouth every 6 (six) hours as needed. Patient not taking: Reported on 12/11/2016 11/18/16   [place] Bing, MD  metroNIDAZOLE (FLAGYL) 500 MG tablet Take 500 mg by mouth 3 (three) times daily.    [provider]  naproxen (EC NAPROSYN) 500 MG EC tablet Take 500 mg by mouth 2 (two) times daily with a meal.    [provider]  valACYclovir (VALTREX) 1000 MG tablet Take 1  tablet (1,000 mg total) by mouth 3 (three) times daily for 7 days. 05/25/17 06/01/17  Tilden Fossa, MD    Family History Family History  Problem Relation Age of Onset  . Diabetes Sister   . Hypertension Paternal Grandmother   . Cancer Paternal Grandfather   . Anesthesia problems Neg Hx   . Hearing loss Neg Hx     Social History Social History   Tobacco Use  . Smoking status: Former Smoker    Packs/day: 0.25    Last attempt to quit: 04/05/2015    Years since quitting: 2.1  . Smokeless tobacco: Never Used  Substance Use Topics  . Alcohol use: Yes    Comment: occ  . Drug use: No     Allergies   Patient has no known allergies.   Review of Systems Review of Systems  All other systems reviewed and are negative.    Physical Exam Updated Vital Signs BP (!) 142/58 (BP Location: Right Arm)   Pulse 84   Temp 98.2 F (36.8 C) (Oral)   Resp 18   Ht 5\' 1"  (1.549 m)   Wt 106.1 kg (234 lb)   LMP 04/26/2017 (Within Days)   SpO2 100%   BMI 44.21  kg/m   Physical Exam  Constitutional: She is oriented to person, place, and time. She appears well-developed and well-nourished.  HENT:  Head: Normocephalic.  Mouth/Throat: Oropharynx is clear and moist.  Small group of vesicles on the right lower lip  Eyes: EOM are normal.  Neck: Neck supple.  Cardiovascular: Normal rate.  Pulmonary/Chest: Effort normal. No respiratory distress.  Neurological: She is alert and oriented to person, place, and time.  No asymmetry of facial muscles  Skin: Skin is warm and dry. Capillary refill takes less than 2 seconds.  Psychiatric: She has a normal mood and affect. Her behavior is normal.  Nursing note and vitals reviewed.  Physical Exam  Constitutional: She is oriented to person, place, and time. She appears well-developed and well-nourished.  HENT:  Head: Normocephalic.  Mouth/Throat: Oropharynx is clear and moist.  Small group of vesicles on the right lower lip  Eyes: EOM are normal.   Neck: Neck supple.  Cardiovascular: Normal rate.  Pulmonary/Chest: Effort normal. No respiratory distress.  Neurological: She is alert and oriented to person, place, and time.  No asymmetry of facial muscles  Skin: Skin is warm and dry. Capillary refill takes less than 2 seconds.  Psychiatric: She has a normal mood and affect. Her behavior is normal.  Nursing note and vitals reviewed.   ED Treatments / Results  Labs (all labs ordered are listed, but only abnormal results are displayed) Labs Reviewed - No data to display  EKG  EKG Interpretation None       Radiology No results found.  Procedures Procedures (including critical care time)  Medications Ordered in ED Medications - No data to display   Initial Impression / Assessment and Plan / ED Course  I have reviewed the triage vital signs and the nursing notes.  Pertinent labs & imaging results that were available during my care of the patient were reviewed by me and considered in my medical decision making (see chart for details).     Patient here for evaluation of stinging and bumps on her right lower lip.  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  Discussed with patient home care for cold sores with infection control precautions.  Discussed outpatient follow-up and return precautions.  Final Clinical Impressions(s) / ED Diagnoses   Final diagnoses:  Cold sore    ED Discharge Orders        Ordered    valACYclovir (VALTREX) 1000 MG tablet  3 times daily     05/25/17 2058       Tilden Fossaees, Lasalle Abee, MD 05/25/17 2110

## 2017-05-25 NOTE — ED Notes (Signed)
Bump on bottom lip. Patient thinks its a bug bite.

## 2017-05-27 ENCOUNTER — Ambulatory Visit (HOSPITAL_COMMUNITY)
Admission: EM | Admit: 2017-05-27 | Discharge: 2017-05-27 | Disposition: A | Discharge status: Home / Self Care | Payer: Medicaid Other | Attending: Family Medicine | Admitting: Family Medicine

## 2017-05-27 ENCOUNTER — Encounter (HOSPITAL_COMMUNITY): Payer: Self-pay | Admitting: Emergency Medicine

## 2017-05-27 ENCOUNTER — Other Ambulatory Visit: Payer: Self-pay

## 2017-05-27 DIAGNOSIS — R220 Localized swelling, mass and lump, head: Secondary | ICD-10-CM

## 2017-05-27 MED ORDER — PREDNISONE 10 MG (21) PO TBPK: ORAL_TABLET | ORAL | 0 refills | Status: DC

## 2017-05-27 NOTE — Medical Student Note (Addendum)
[facility] Provider Student Note For educational purposes for Medical, PA and NP students only and not part of the legal medical record.   CSN: [id number] Arrival date & time: 05/27/17  1002     History   Chief Complaint Chief Complaint  Patient presents with  . Oral Swelling    HPI Faith [name] is a 27 y.o. female.  HPI  27 year old [place] female presents for focal right-sided bottom lip swelling, worsening x 2 days after feeling a sharp, instant sting that "felt like a bug bite" while standing in a grocery line.   - [facility] and given Valacyclovir for suspected HSV1, started taking it last night without improvement. - No current pain, but has numbness over the area.  - No relief with ice or heat application. - Denies tongue or throat swelling, fever/chills, genital lesions, rash.    Past Medical History:  Diagnosis Date  . Chlamydia   . Hypertension   . Infection    UTI  . Urinary tract infection   . Vaginal Pap smear, abnormal    f/u was ok    Patient Active Problem List   Diagnosis Date Noted  . Abnormal Pap smear of vagina 01/31/2016    Past Surgical History:  Procedure Laterality Date  . CESAREAN SECTION    . COLPOSCOPY    . DILATION AND EVACUATION N/A 10/04/2012   Procedure: DILATATION AND EVACUATION;  Surgeon: Lesly DukesKelly H Leggett, MD;  Location: [facility];  Service: Gynecology;  Laterality: N/A;  . LEEP  11/2016    OB History    Gravida Para Term Preterm AB Living   4 3 3  0 1 3   SAB TAB Ectopic Multiple Live Births   0 1 0 0 3       Home Medications    Prior to Admission medications   Medication Sig Start Date End Date Taking? Authorizing Provider  valACYclovir (VALTREX) 1000 MG tablet Take 1 tablet (1,000 mg total) by mouth 3 (three) times daily for 7 days. 05/25/17 06/01/17 Yes Tilden Fossaees, Elizabeth, MD  ketorolac (TORADOL) 10 MG tablet Take 1 tablet (10 mg total) by mouth every 6 (six) hours as  needed. Patient not taking: Reported on 12/11/2016 11/18/16   [place] BingPickens, Charlie, MD  metroNIDAZOLE (FLAGYL) 500 MG tablet Take 500 mg by mouth 3 (three) times daily.    [provider]  naproxen (EC NAPROSYN) 500 MG EC tablet Take 500 mg by mouth 2 (two) times daily with a meal.    [provider]    Family History Family History  Problem Relation Age of Onset  . Diabetes Sister   . Hypertension Paternal Grandmother   . Cancer Paternal Grandfather   . Anesthesia problems Neg Hx   . Hearing loss Neg Hx     Social History Social History   Tobacco Use  . Smoking status: Former Smoker    Packs/day: 0.25    Last attempt to quit: 04/05/2015    Years since quitting: 2.1  . Smokeless tobacco: Never Used  Substance Use Topics  . Alcohol use: Yes    Comment: occ  . Drug use: No     Allergies   Patient has no known allergies.   Review of Systems Review of Systems  Constitutional: Negative for chills, fatigue and fever.  HENT: Negative for dental problem, drooling, mouth sores, sore throat and trouble swallowing.        Lip swelling  Respiratory: Negative for choking, wheezing and stridor.   Musculoskeletal: Negative for neck pain and neck stiffness.  Neurological: Negative for facial asymmetry and speech difficulty.     Physical Exam Updated Vital Signs BP 116/73 (BP Location: Left Arm)   Pulse 88   Temp 98.8 F (37.1 C) (Oral)   LMP  (LMP Unknown)   SpO2 100%   Physical Exam  Constitutional: She is oriented to person, place, and time. She appears well-developed and well-nourished.  HENT:  Head: Normocephalic and atraumatic.  Right Ear: External ear normal.  Left Ear: External ear normal.  Mouth/Throat: No oropharyngeal exudate.  Moderate right lower lip swelling with singular lesion on external lip, without drainage, induration, tenderness. No vesicles appreciated. Right sided submandibular lymphadenopathy, mildly tender to palpation.  Uvula is  midline, no tonsillar swelling, no swelling on floor of the mouth.   Eyes: Conjunctivae are normal. Right eye exhibits no discharge. Left eye exhibits no discharge.  Neck: Neck supple.  Cardiovascular: Normal rate.  Pulmonary/Chest: Effort normal.  Lymphadenopathy:    She has cervical adenopathy.  Neurological: She is alert and oriented to person, place, and time.  Skin: Skin is warm.  Psychiatric: She has a normal mood and affect. Her behavior is normal.  Nursing note and vitals reviewed.    ED Treatments / Results  Labs (all labs ordered are listed, but only abnormal results are displayed) Labs Reviewed - No data to display  EKG  EKG Interpretation None       Radiology No results found.  Procedures Procedures (including critical care time)  Medications Ordered in ED Medications - No data to display   Initial Impression / Assessment and Plan / ED Course  I have reviewed the triage vital signs and the nursing notes.  Pertinent labs & imaging results that were available during my care of the patient were reviewed by me and considered in my medical decision making (see chart for details).    Lip swelling 27 year old AA female with right lower lip swelling, worsening x 2 days. S/sx consistent with a bug bite resulting in angioedema and does not appear to be HSV 1 given no vesicles on physical exam.    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 - Counseled to return if swelling worsens, she develops fever/chills, difficulty breathing or swallowing.  - Patient is agreeable to plan.   Final Clinical Impressions(s) / ED Diagnoses   Final diagnoses:  None    New Prescriptions New Prescriptions   No medications on file

## 2017-05-27 NOTE — Discharge Instructions (Signed)
Please follow up if you are not seeing improvement over the next 24-48 hours.

## 2017-05-27 NOTE — ED Triage Notes (Signed)
Pt reports feeling a sting on her lip and then it instantly started swelling.  She was seen in the ED on Monday night and prescribed Valacyclovir.  She only started taking the abx last night, for two doses so far.  Pt is concerned she was not properly diagnosed because it is not getting any better.

## 2017-05-28 NOTE — ED Provider Notes (Signed)
[facility]   [id number] 05/27/17 Arrival Time: 1002  ASSESSMENT & PLAN:  1. Lip swelling     Meds ordered this encounter  Medications  . predniSONE (STERAPRED UNI-PAK 21 TAB) 10 MG (21) TBPK tablet    Sig: Take as directed.    Dispense:  21 tablet    Refill:  0   S/sx consistent with a bug bite resulting in angioedema and does not appear to be HSV 1 given no vesicles on physical exam.    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 - Counseled to return if swelling worsens, she develops fever/chills, difficulty breathing or swallowing.  - Patient is agreeable to plan.   After Visit Summary given.   SUBJECTIVE:  Faith [name] is a 27 y.o. female who presents with reported focal right-sided bottom lip swelling, worsening x 2 days after feeling a sharp, instant sting that "felt like a bug bite" while standing in a grocery line.   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 - No current pain, but has numbness over the area.  - No relief with ice or heat application. - Denies tongue or throat swelling, fever/chills, genital lesions, rash.   ROS: As per HPI.  OBJECTIVE: Vitals:   05/27/17 1016  BP: 116/73  Pulse: 88  Temp: 98.8 F (37.1 C)  TempSrc: Oral  SpO2: 100%    Constitutional: She is oriented to person, place, and time. She appears well-developed and well-nourished.  HENT:  Head: Normocephalic and atraumatic.  Right Ear: External ear normal.  Left Ear: External ear normal.  Mouth/Throat: No oropharyngeal exudate.  Moderate right lower lip swelling with singular lesion on external lip, without drainage, induration, tenderness. No vesicles appreciated. Right sided submandibular lymphadenopathy, mildly tender to palpation.    Uvula is midline, no tonsillar swelling, no swelling on floor of the mouth.   Eyes: Conjunctivae are normal. Right eye exhibits no discharge. Left eye exhibits no discharge.  Neck: Neck supple.  Cardiovascular: Normal rate.  Pulmonary/Chest: Effort normal.  Lymphadenopathy:    She has cervical adenopathy bilaterally Neurological: She is alert and oriented to person, place, and time.  Skin: Skin is warm.    No Known Allergies  Past Medical History:  Diagnosis Date  . Chlamydia   . Hypertension   . Infection    UTI  . Urinary tract infection   . Vaginal Pap smear, abnormal    f/u was ok   Social History   Socioeconomic History  . Marital status: Single    Spouse name: Not on file  . Number of children: Not on file  . Years of education: Not on file  . Highest education level: Not on file  Social Needs  . Financial resource strain: Not on file  . Food insecurity - worry: Not on file  . Food insecurity - inability: Not on file  . Transportation needs - medical: Not on file  . Transportation needs - non-medical: Not on file  Occupational History  . Not on file  Tobacco Use  . Smoking status: Former Smoker    Packs/day: 0.25    Last attempt to quit: 04/05/2015    Years since quitting: 2.1  . Smokeless tobacco: Never Used  Substance and Sexual Activity  . Alcohol  use: Yes    Comment: occ  . Drug use: No  . Sexual activity: Yes    Partners: Male    Birth control/protection: None  Other Topics Concern  . Not on file  Social History Narrative  . Not on file   Family History  Problem Relation Age of Onset  . Diabetes Sister   . Hypertension Paternal Grandmother   . Cancer Paternal Grandfather   . Anesthesia problems Neg Hx   . Hearing loss Neg Hx    Past Surgical History:  Procedure Laterality Date  . CESAREAN SECTION    . COLPOSCOPY    . DILATION AND EVACUATION N/A 10/04/2012   Procedure: DILATATION AND EVACUATION;  Surgeon: Lesly Dukes, MD;  Location:  [facility];  Service: Gynecology;  Laterality: N/A;  . LEEP  11/2016     Mardella Layman, MD 05/28/17 1000

## 2018-02-15 ENCOUNTER — Telehealth: Payer: Self-pay | Admitting: Family Medicine

## 2018-02-15 ENCOUNTER — Encounter (HOSPITAL_COMMUNITY): Payer: Self-pay

## 2018-02-15 ENCOUNTER — Inpatient Hospital Stay (HOSPITAL_COMMUNITY)
Admission: AD | Admit: 2018-02-15 | Discharge: 2018-02-15 | Disposition: A | Discharge status: Home / Self Care | Payer: Medicaid Other | Source: Ambulatory Visit | Attending: Obstetrics and Gynecology | Admitting: Obstetrics and Gynecology

## 2018-02-15 ENCOUNTER — Other Ambulatory Visit: Payer: Self-pay

## 2018-02-15 DIAGNOSIS — L02211 Cutaneous abscess of abdominal wall: Secondary | ICD-10-CM | POA: Insufficient documentation

## 2018-02-15 DIAGNOSIS — T8149XA Infection following a procedure, other surgical site, initial encounter: Secondary | ICD-10-CM

## 2018-02-15 DIAGNOSIS — K651 Peritoneal abscess: Secondary | ICD-10-CM

## 2018-02-15 LAB — URINALYSIS, ROUTINE W REFLEX MICROSCOPIC
Bacteria, UA: NONE SEEN
Bilirubin Urine: NEGATIVE
Glucose, UA: NEGATIVE mg/dL
Ketones, ur: NEGATIVE mg/dL
Leukocytes, UA: NEGATIVE
Nitrite: NEGATIVE
Protein, ur: 30 mg/dL — AB
RBC / HPF: >50 RBC/hpf — ABNORMAL HIGH (ref 0–5)
Specific Gravity, Urine: 1.032 — ABNORMAL HIGH (ref 1.005–1.030)
pH: 5 (ref 5.0–8.0)

## 2018-02-15 LAB — POCT PREGNANCY, URINE: PREG TEST UR: NEGATIVE

## 2018-02-15 MED ORDER — KETOROLAC TROMETHAMINE 60 MG/2ML IM SOLN
60.0000 mg | Freq: Once | INTRAMUSCULAR | Status: AC
  Administered 2018-02-15: 60 mg via INTRAMUSCULAR
  Filled 2018-02-15: qty 2

## 2018-02-15 MED ORDER — SULFAMETHOXAZOLE-TRIMETHOPRIM 800-160 MG PO TABS
1.0000 | ORAL_TABLET | Freq: Once | ORAL | Status: AC
  Administered 2018-02-15: 1 via ORAL
  Filled 2018-02-15: qty 1

## 2018-02-15 MED ORDER — SULFAMETHOXAZOLE-TRIMETHOPRIM 800-160 MG PO TABS: 1.0000 | ORAL_TABLET | Freq: Two times a day (BID) | ORAL | 0 refills | Status: DC

## 2018-02-15 MED ORDER — LIDOCAINE HCL 1 % IJ SOLN
5.0000 mL | Freq: Once | INTRAMUSCULAR | Status: AC
  Administered 2018-02-15: 5 mL via INTRADERMAL
  Filled 2018-02-15: qty 5

## 2018-02-15 NOTE — MAU Provider Note (Signed)
Chief Complaint:  Drainage from Incision   First Provider Initiated Contact with Patient 02/15/18 1130      HPI: Faith [name] is a 27 y.o. [id number] who presents to maternity admissions reporting pus draining from center of old C/S scar. Last C/S was 11 years ago   Has tenderness around area. . She reports no vaginal bleeding, vaginal itching/burning, urinary symptoms, h/a, dizziness, n/v, or fever/chills.    Other  This is a new problem. The current episode started in the past 7 days. The problem has been unchanged. Pertinent negatives include no abdominal pain, chills, fever, headaches, nausea, visual change or vomiting. Exacerbated by: palpation. She has tried nothing for the symptoms.   RN Note: Pt c/o incision drainage since 02/13/18. Pt noticed greenish-yellow d/c and slight odor from the incision cite. Pain 8/10. No fever.   Past Medical History: Past Medical History:  Diagnosis Date  . Chlamydia   . Hypertension   . Infection    UTI  . Urinary tract infection   . Vaginal Pap smear, abnormal    f/u was ok    Past obstetric history: OB History  Gravida Para Term Preterm AB Living  4 3 3  0 1 3  SAB TAB Ectopic Multiple Live Births  0 1 0 0 3    # Outcome Date GA Lbr Len/2nd Weight Sex Delivery Anes PTL Lv  4 TAB 03/12/16     TAB     3 Term 08/07/13 [redacted]w[redacted]d 08:46 / 00:09 3671 g M VBAC EPI  LIV  2 Term 01/21/08    F Vag-Spont   LIV     Birth Comments: no care- did not know she was pregnant  1 Term 12/21/06    F CS-LTranv  N LIV     Birth Comments: BP    Past Surgical History: Past Surgical History:  Procedure Laterality Date  . CESAREAN SECTION    . COLPOSCOPY    . DILATION AND EVACUATION N/A 10/04/2012   Procedure: DILATATION AND EVACUATION;  Surgeon: Lesly Dukes, MD;  Location: [facility];  Service: Gynecology;  Laterality: N/A;  . LEEP  11/2016    Family History: Family History  Problem Relation Age of Onset  . Diabetes Sister   . Hypertension  Paternal Grandmother   . Cancer Paternal Grandfather   . Anesthesia problems Neg Hx   . Hearing loss Neg Hx     Social History: Social History   Tobacco Use  . Smoking status: Former Smoker    Packs/day: 0.25    Last attempt to quit: 04/05/2015    Years since quitting: 2.8  . Smokeless tobacco: Never Used  Substance Use Topics  . Alcohol use: Yes    Comment: occ  . Drug use: No    Allergies: No Known Allergies  Meds:  Medications Prior to Admission  Medication Sig Dispense Refill Last Dose  . ketorolac (TORADOL) 10 MG tablet Take 1 tablet (10 mg total) by mouth every 6 (six) hours as needed. (Patient not taking: Reported on 12/11/2016) 20 tablet 0 Not Taking  . metroNIDAZOLE (FLAGYL) 500 MG tablet Take 500 mg by mouth 3 (three) times daily.   Not Taking  . naproxen (EC NAPROSYN) 500 MG EC tablet Take 500 mg by mouth 2 (two) times daily with a meal.   Not Taking  . predniSONE (STERAPRED UNI-PAK 21 TAB) 10 MG (21) TBPK tablet Take as directed. 21 tablet 0     I have reviewed patient's Past  Medical Hx, Surgical Hx, Family Hx, Social Hx, medications and allergies.  ROS:  Review of Systems  Constitutional: Negative for chills and fever.  Gastrointestinal: Negative for abdominal pain, nausea and vomiting.  Skin:       Pus draining from center of scar   Neurological: Negative for headaches.   Other systems negative     Physical Exam   Patient Vitals for the past 24 hrs:  BP Temp Temp src Pulse Resp SpO2 Weight  02/15/18 1131 110/66 97.8 F (36.6 C) Oral 92 16 100 % -  02/15/18 1118 (!) 141/81 98.1 F (36.7 C) - 74 16 - 98 kg   Constitutional: Well-developed, well-nourished female in no acute distress.  Cardiovascular: normal rate and rhythm Respiratory: normal effort, no distress. GI: Abd soft, non-tender.  Nondistended.  No rebound, No guarding.   MS: Extremities nontender, no edema, normal ROM Neurologic: Alert and oriented x 4.   Grossly nonfocal. GU: Neg  CVAT. Skin:  Warm and Dry   There is a 1-40mm raised lesion in center of old C/S scar which is draining from two tiny openings (see picture)   There is a 2cm area of induration above lesion. Psych:  Affect appropriate.          Labs:    Results for orders placed or performed during the hospital encounter of 02/15/18 (from the past 24 hour(s))  Urinalysis, Routine w reflex microscopic     Status: Abnormal   Collection Time: 02/15/18 11:10 AM  Result Value Ref Range   Color, Urine YELLOW YELLOW   APPearance HAZY (A) CLEAR   Specific Gravity, Urine 1.032 (H) 1.005 - 1.030   pH 5.0 5.0 - 8.0   Glucose, UA NEGATIVE NEGATIVE mg/dL   Hgb urine dipstick LARGE (A) NEGATIVE   Bilirubin Urine NEGATIVE NEGATIVE   Ketones, ur NEGATIVE NEGATIVE mg/dL   Protein, ur 30 (A) NEGATIVE mg/dL   Nitrite NEGATIVE NEGATIVE   Leukocytes, UA NEGATIVE NEGATIVE   RBC / HPF >50 (H) 0 - 5 RBC/hpf   WBC, UA 0-5 0 - 5 WBC/hpf   Bacteria, UA NONE SEEN NONE SEEN   Squamous Epithelial / LPF 6-10 0 - 5   Mucus PRESENT   Pregnancy, urine POC     Status: None   Collection Time: 02/15/18 11:43 AM  Result Value Ref Range   Preg Test, Ur NEGATIVE NEGATIVE  Aerobic Culture (superficial specimen)     Status: None (Preliminary result)   Collection Time: 02/15/18 12:25 PM  Result Value Ref Range   Specimen Description      ABDOMEN Performed at [facility], [address], [place], [place] [address]    Special Requests      Normal Performed at [facility], [address], [place], [place] [address]    Gram Stain      RARE WBC PRESENT, PREDOMINANTLY PMN FEW GRAM POSITIVE COCCI RARE GRAM POSITIVE RODS Performed at [facility], [address], [place], [place] [address]    Culture PENDING    Report Status PENDING      Imaging:  No results found.  MAU Course/MDM: I have ordered labs as follows: wound culture Imaging ordered: none recommended per Dr Vergie Living Results reviewed.   Consult  Dr Vergie Living who recommends drainage, cultures and Bactrim DS, Wound check 1 week  Treatments in MAU included Incision and drainage.    Discussed complications and possible outcomes of procedure including recurrence of cyst, scarring leading to infecton, bleeding,  dyspareunia, distortion of anatomy.  The area was prepped with Iodine.  1% Lidocaine  was then used to infiltrate area  A 1mm superficial incision was made using a sterile scapel. Upon palpation of the mass, a small amount of bloody purulent drainage was expressed through the incision.  Samples of the drainage were sent for cultures. Patient tolerated the procedure well - Bactrim DS bid x 7 days for treatment   She was told to call to be examined if she experiences increasing swelling, pain, vaginal discharge, or fever.   - She will need an appointment in [facility] Clinic[facility] week from now for wound check .   Pt stable at time of discharge.  Assessment: Skin abscess at site of old cesarean section scar  Plan: Discharge home Recommend keep area clean and dry Rx sent for Septra DS for infection Return to clinic in 1 week for wound check  Encouraged to return here or to other Urgent Care/ED if she develops worsening of symptoms, increase in pain, fever, or other concerning symptoms.   Wynelle Bourgeois CNM, MSN Certified Nurse-Midwife 02/15/2018 11:41 AM

## 2018-02-15 NOTE — MAU Note (Signed)
Pt presents to MAU with complaints of drainage above her cesarean section scar that she had -12/21/06. Small area above scar with area of pus noted. Reports pain at incision

## 2018-02-15 NOTE — MAU Note (Signed)
Pt c/o incision drainage since 02/13/18. Pt noticed greenish-yellow d/c and slight odor from the incision cite. Pain 8/10. No fever.  Adah Perl RN

## 2018-02-15 NOTE — Telephone Encounter (Signed)
Called patient to give her appointment information. She did not answer, and I was not able to leave a voicemail.

## 2018-02-15 NOTE — Discharge Instructions (Signed)

## 2018-02-16 ENCOUNTER — Encounter: Payer: Self-pay | Admitting: *Deleted

## 2018-02-17 LAB — AEROBIC CULTURE W GRAM STAIN (SUPERFICIAL SPECIMEN)
Culture: NORMAL
Special Requests: NORMAL

## 2018-02-17 LAB — AEROBIC CULTURE  (SUPERFICIAL SPECIMEN)

## 2018-02-22 ENCOUNTER — Ambulatory Visit: Payer: Self-pay | Admitting: Family Medicine

## 2018-03-15 ENCOUNTER — Emergency Department (HOSPITAL_COMMUNITY): Payer: No Typology Code available for payment source

## 2018-03-15 ENCOUNTER — Other Ambulatory Visit: Payer: Self-pay

## 2018-03-15 ENCOUNTER — Emergency Department (HOSPITAL_COMMUNITY)
Admission: EM | Admit: 2018-03-15 | Discharge: 2018-03-16 | Disposition: A | Discharge status: Home / Self Care | Payer: No Typology Code available for payment source | Attending: Emergency Medicine | Admitting: Emergency Medicine

## 2018-03-15 ENCOUNTER — Encounter (HOSPITAL_COMMUNITY): Payer: Self-pay | Admitting: *Deleted

## 2018-03-15 DIAGNOSIS — I10 Essential (primary) hypertension: Secondary | ICD-10-CM | POA: Insufficient documentation

## 2018-03-15 DIAGNOSIS — Z79899 Other long term (current) drug therapy: Secondary | ICD-10-CM | POA: Insufficient documentation

## 2018-03-15 DIAGNOSIS — Y92410 Unspecified street and highway as the place of occurrence of the external cause: Secondary | ICD-10-CM | POA: Diagnosis not present

## 2018-03-15 DIAGNOSIS — M7918 Myalgia, other site: Secondary | ICD-10-CM | POA: Diagnosis present

## 2018-03-15 DIAGNOSIS — Y999 Unspecified external cause status: Secondary | ICD-10-CM | POA: Insufficient documentation

## 2018-03-15 DIAGNOSIS — Y9389 Activity, other specified: Secondary | ICD-10-CM | POA: Insufficient documentation

## 2018-03-15 DIAGNOSIS — R51 Headache: Secondary | ICD-10-CM | POA: Insufficient documentation

## 2018-03-15 DIAGNOSIS — R109 Unspecified abdominal pain: Secondary | ICD-10-CM | POA: Insufficient documentation

## 2018-03-15 DIAGNOSIS — Z87891 Personal history of nicotine dependence: Secondary | ICD-10-CM | POA: Diagnosis not present

## 2018-03-15 MED ORDER — ACETAMINOPHEN 500 MG PO TABS
1000.0000 mg | ORAL_TABLET | Freq: Once | ORAL | Status: AC
  Administered 2018-03-15: 1000 mg via ORAL
  Filled 2018-03-15: qty 2
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
## 2018-03-15 NOTE — ED Notes (Signed)
Patient transported to X-ray 

## 2018-03-16 ENCOUNTER — Emergency Department (HOSPITAL_COMMUNITY): Payer: No Typology Code available for payment source

## 2018-03-16 LAB — COMPREHENSIVE METABOLIC PANEL WITH GFR
ALT: 20 U/L (ref 0–44)
AST: 16 U/L (ref 15–41)
Albumin: 3.9 g/dL (ref 3.5–5.0)
Alkaline Phosphatase: 50 U/L (ref 38–126)
Anion gap: 7 (ref 5–15)
BUN: 7 mg/dL (ref 6–20)
CO2: 25 mmol/L (ref 22–32)
Calcium: 9.5 mg/dL (ref 8.9–10.3)
Chloride: 107 mmol/L (ref 98–111)
Creatinine, Ser: 0.76 mg/dL (ref 0.44–1.00)
GFR calc Af Amer: >60 mL/min
GFR calc non Af Amer: >60 mL/min
Glucose, Bld: 92 mg/dL (ref 70–99)
Potassium: 3.6 mmol/L (ref 3.5–5.1)
Sodium: 139 mmol/L (ref 135–145)
Total Bilirubin: 0.5 mg/dL (ref 0.3–1.2)
Total Protein: 7.3 g/dL (ref 6.5–8.1)

## 2018-03-16 LAB — I-STAT BETA HCG BLOOD, ED (MC, WL, AP ONLY): I-stat hCG, quantitative: <5 m[IU]/mL (ref <5)

## 2018-03-16 LAB — CBC WITH DIFFERENTIAL/PLATELET
Abs Immature Granulocytes: 0.03 10*3/uL (ref 0.00–0.07)
BASOS ABS: 0 10*3/uL (ref 0.0–0.1)
Basophils Relative: 0 %
EOS ABS: 0.1 10*3/uL (ref 0.0–0.5)
EOS PCT: 1 %
HEMATOCRIT: 38.7 % (ref 36.0–46.0)
Hemoglobin: 11.9 g/dL — ABNORMAL LOW (ref 12.0–15.0)
Immature Granulocytes: 0 %
LYMPHS ABS: 2.9 10*3/uL (ref 0.7–4.0)
Lymphocytes Relative: 36 %
MCH: 26.1 pg (ref 26.0–34.0)
MCHC: 30.7 g/dL (ref 30.0–36.0)
MCV: 84.9 fL (ref 80.0–100.0)
MONO ABS: 0.4 10*3/uL (ref 0.1–1.0)
Monocytes Relative: 5 %
NRBC: 0 % (ref 0.0–0.2)
Neutro Abs: 4.7 10*3/uL (ref 1.7–7.7)
Neutrophils Relative %: 58 %
Platelets: 307 10*3/uL (ref 150–400)
RBC: 4.56 MIL/uL (ref 3.87–5.11)
RDW: 13.5 % (ref 11.5–15.5)
WBC: 8.2 10*3/uL (ref 4.0–10.5)

## 2018-03-16 LAB — LIPASE, BLOOD: Lipase: 26 U/L (ref 11–51)

## 2018-03-16 MED ORDER — KETOROLAC TROMETHAMINE 15 MG/ML IJ SOLN
15.0000 mg | Freq: Once | INTRAMUSCULAR | Status: AC
  Administered 2018-03-16: 15 mg via INTRAVENOUS
  Filled 2018-03-16: qty 1

## 2018-03-16 MED ORDER — METHOCARBAMOL 500 MG PO TABS: 500.0000 mg | ORAL_TABLET | Freq: Two times a day (BID) | ORAL | 0 refills | Status: AC

## 2018-03-16 MED ORDER — IOHEXOL 300 MG/ML  SOLN
100.0000 mL | Freq: Once | INTRAMUSCULAR | Status: AC | PRN
  Administered 2018-03-16: 100 mL via INTRAVENOUS

## 2018-03-16 NOTE — ED Notes (Signed)
Patient Alert and oriented to baseline. Stable and ambulatory to baseline. Patient verbalized understanding of the discharge instructions.  Patient belongings were taken by the patient.   

## 2018-03-16 NOTE — ED Notes (Signed)
Pt returned from radiology.
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
## 2018-03-16 NOTE — ED Notes (Signed)
Patient transported to CT 

## 2018-03-16 NOTE — Discharge Instructions (Signed)

## 2018-03-19 ENCOUNTER — Encounter (HOSPITAL_COMMUNITY): Payer: Self-pay

## 2018-03-19 ENCOUNTER — Ambulatory Visit (HOSPITAL_COMMUNITY)
Admission: EM | Admit: 2018-03-19 | Discharge: 2018-03-19 | Disposition: A | Discharge status: Home / Self Care | Payer: Self-pay | Attending: Internal Medicine | Admitting: Internal Medicine

## 2018-03-19 DIAGNOSIS — L02211 Cutaneous abscess of abdominal wall: Secondary | ICD-10-CM

## 2018-03-19 DIAGNOSIS — M79642 Pain in left hand: Secondary | ICD-10-CM

## 2018-03-19 MED ORDER — DOXYCYCLINE HYCLATE 100 MG PO CAPS: 100.0000 mg | ORAL_CAPSULE | Freq: Two times a day (BID) | ORAL | 0 refills | Status: AC

## 2018-03-19 MED ORDER — IBUPROFEN 600 MG PO TABS: 600.0000 mg | ORAL_TABLET | Freq: Four times a day (QID) | ORAL | 0 refills | Status: DC | PRN
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
## 2018-03-19 NOTE — Discharge Instructions (Addendum)
Use anti-inflammatories for pain/swelling. You may take up to 800 mg Ibuprofen every 8 hours with food. You may supplement Ibuprofen with Tylenol [phone] mg every 8 hours.  Please continue to wear Ace wraps to provide support to fingers, do not recommend a full splint given you do not have a fracture Please ice hand multiple times a day to help decrease the swelling and inflammation  Abscess Please begin doxycycline twice daily for the next 10 days Apply warm compresses or warm soaks in the bathtub with gentle massage to this area multiple times a day if possible Please monitor symptoms, follow-up in approximately 3 days if symptoms not improving with antibiotic and worsening, as this may need to be drained
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
## 2018-05-02 ENCOUNTER — Encounter (HOSPITAL_COMMUNITY): Payer: Self-pay | Admitting: *Deleted

## 2018-05-02 ENCOUNTER — Ambulatory Visit (HOSPITAL_COMMUNITY)
Admission: EM | Admit: 2018-05-02 | Discharge: 2018-05-02 | Disposition: A | Discharge status: Home / Self Care | Payer: Self-pay | Attending: Family Medicine | Admitting: Family Medicine

## 2018-05-02 ENCOUNTER — Other Ambulatory Visit: Payer: Self-pay

## 2018-05-02 DIAGNOSIS — Z8249 Family history of ischemic heart disease and other diseases of the circulatory system: Secondary | ICD-10-CM | POA: Insufficient documentation

## 2018-05-02 DIAGNOSIS — Z791 Long term (current) use of non-steroidal anti-inflammatories (NSAID): Secondary | ICD-10-CM | POA: Insufficient documentation

## 2018-05-02 DIAGNOSIS — I10 Essential (primary) hypertension: Secondary | ICD-10-CM | POA: Insufficient documentation

## 2018-05-02 DIAGNOSIS — Z87891 Personal history of nicotine dependence: Secondary | ICD-10-CM | POA: Insufficient documentation

## 2018-05-02 DIAGNOSIS — J039 Acute tonsillitis, unspecified: Secondary | ICD-10-CM

## 2018-05-02 DIAGNOSIS — J029 Acute pharyngitis, unspecified: Secondary | ICD-10-CM | POA: Insufficient documentation

## 2018-05-02 LAB — POCT RAPID STREP A: Streptococcus, Group A Screen (Direct): NEGATIVE

## 2018-05-02 MED ORDER — AMOXICILLIN 875 MG PO TABS: 875.0000 mg | ORAL_TABLET | Freq: Two times a day (BID) | ORAL | 0 refills | Status: DC

## 2018-05-02 NOTE — ED Triage Notes (Signed)
C/O sore throat since 12/24 without known fevers.

## 2018-05-02 NOTE — ED Provider Notes (Signed)
[facility]    CSN: [id number] Arrival date & time: 05/02/18  1416     History   Chief Complaint Chief Complaint  Patient presents with  . Appointment    2:15  . Sore Throat    HPI Faith [name] is a 27 y.o. female.   27 year old woman, established [facility] patient, who presents to the urgent care with a complaint of sore throat.  Symptoms began 5 days ago.  Patient's had recurrent tonsillitis and was referred to ear nose and throat for consideration of tonsillectomy.  She does not have any insurance so they would not see her.  Patient works at a daycare.     Past Medical History:  Diagnosis Date  . Chlamydia   . Hypertension   . Infection    UTI  . Urinary tract infection   . Vaginal Pap smear, abnormal    f/u was ok    Patient Active Problem List   Diagnosis Date Noted  . Abnormal Pap smear of vagina 01/31/2016    Past Surgical History:  Procedure Laterality Date  . CESAREAN SECTION    . COLPOSCOPY    . DILATION AND EVACUATION N/A 10/04/2012   Procedure: DILATATION AND EVACUATION;  Surgeon: Lesly DukesKelly H Leggett, MD;  Location: [facility];  Service: Gynecology;  Laterality: N/A;  . LEEP  11/2016    OB History    Gravida  4   Para  3   Term  3   Preterm  0   AB  1   Living  3     SAB  0   TAB  1   Ectopic  0   Multiple  0   Live Births  3            Home Medications    Prior to Admission medications   Medication Sig Start Date End Date Taking? Authorizing Provider  ibuprofen (ADVIL,MOTRIN) 600 MG tablet Take 1 tablet (600 mg total) by mouth every 6 (six) hours as needed. 03/19/18   Wieters, Hallie C, PA-C  naproxen (EC NAPROSYN) 500 MG EC tablet Take 500 mg by mouth 2 (two) times daily with a meal.    [provider]    Family History Family History  Problem Relation Age of Onset  . Diabetes Sister   . Hypertension Paternal Grandmother   . Cancer Paternal Grandfather   . Anesthesia  problems Neg Hx   . Hearing loss Neg Hx     Social History Social History   Tobacco Use  . Smoking status: Former Smoker    Packs/day: 0.25    Last attempt to quit: 04/05/2015    Years since quitting: 3.0  . Smokeless tobacco: Never Used  Substance Use Topics  . Alcohol use: Not Currently  . Drug use: Never     Allergies   Patient has no known allergies.   Review of Systems Review of Systems   Physical Exam Triage Vital Signs ED Triage Vitals  Enc Vitals Group     BP 05/02/18 1429 115/76     Pulse Rate 05/02/18 1429 80     Resp 05/02/18 1429 18     Temp 05/02/18 1429 98.7 F (37.1 C)     Temp Source 05/02/18 1429 Oral     SpO2 05/02/18 1429 98 %     Weight --      Height --      Head Circumference --  Peak Flow --      Pain Score 05/02/18 1431 6     Pain Loc --      Pain Edu? --      Excl. in GC? --    No data found.  Updated Vital Signs BP 115/76   Pulse 80   Temp 98.7 F (37.1 C) (Oral)   Resp 18   SpO2 98%    Physical Exam Vitals signs and nursing note reviewed.  Constitutional:      Appearance: She is well-developed. She is obese.  HENT:     Head: Normocephalic.     Right Ear: Tympanic membrane and ear canal normal.     Left Ear: Tympanic membrane and ear canal normal.     Nose: Congestion present.     Mouth/Throat:     Mouth: Mucous membranes are moist.     Pharynx: Uvula midline. Oropharyngeal exudate and posterior oropharyngeal erythema present.     Tonsils: Tonsillar exudate present. No tonsillar abscesses. Swelling: 2+ on the right. 3+ on the left.  Eyes:     Conjunctiva/sclera: Conjunctivae normal.  Neck:     Musculoskeletal: Normal range of motion and neck supple.  Cardiovascular:     Heart sounds: Normal heart sounds.  Pulmonary:     Effort: Pulmonary effort is normal.     Breath sounds: Normal breath sounds.  Lymphadenopathy:     Cervical: Cervical adenopathy present.  Skin:    General: Skin is warm and dry.    Neurological:     General: No focal deficit present.     Mental Status: She is alert and oriented to person, place, and time.  Psychiatric:        Mood and Affect: Mood normal.        Behavior: Behavior normal.      UC Treatments / Results  Labs (all labs ordered are listed, but only abnormal results are displayed) Labs Reviewed - No data to display  EKG None  Radiology No results found.  Procedures Procedures (including critical care time)  Medications Ordered in UC Medications - No data to display  Initial Impression / Assessment and Plan / UC Course  I have reviewed the triage vital signs and the nursing notes.  Pertinent labs & imaging results that were available during my care of the patient were reviewed by me and considered in my medical decision making (see chart for details).    Final Clinical Impressions(s) / UC Diagnoses   Final diagnoses:  None   Discharge Instructions   None    ED Prescriptions    None     Controlled Substance Prescriptions [place] Controlled Substance Registry consulted? Not Applicable   Elvina SidleLauenstein, Sayan Aldava, MD 05/02/18 1455

## 2018-05-02 NOTE — Discharge Instructions (Addendum)
Try to make an appointment with 1 of the community clinics noted below.  Your strep test is negative but the significant amount of swelling and coating of your tonsils means that you need to take the antibiotic as prescribed.

## 2018-05-04 LAB — CULTURE, GROUP A STREP (THRC)

## 2019-01-15 ENCOUNTER — Other Ambulatory Visit: Payer: Self-pay

## 2019-01-15 ENCOUNTER — Encounter (HOSPITAL_COMMUNITY): Payer: Self-pay

## 2019-01-15 ENCOUNTER — Ambulatory Visit (HOSPITAL_COMMUNITY)
Admission: EM | Admit: 2019-01-15 | Discharge: 2019-01-15 | Disposition: A | Discharge status: Home / Self Care | Payer: Self-pay | Attending: Emergency Medicine | Admitting: Emergency Medicine

## 2019-01-15 ENCOUNTER — Ambulatory Visit (HOSPITAL_COMMUNITY): Payer: Self-pay

## 2019-01-15 ENCOUNTER — Ambulatory Visit (INDEPENDENT_AMBULATORY_CARE_PROVIDER_SITE_OTHER): Payer: Self-pay

## 2019-01-15 DIAGNOSIS — S60221A Contusion of right hand, initial encounter: Secondary | ICD-10-CM

## 2019-01-15 MED ORDER — IBUPROFEN 600 MG PO TABS: 600.0000 mg | ORAL_TABLET | Freq: Four times a day (QID) | ORAL | 0 refills | Status: DC | PRN

## 2019-01-15 NOTE — ED Provider Notes (Signed)
[facility]    CSN: [id number] Arrival date & time: 01/15/19  1105      History   Chief Complaint Chief Complaint  Patient presents with  . Appointment  . (11:10 Wrist Injury)    HPI Faith [name] is a 28 y.o. female.   Patient presents with pain in her right hand after falling yesterday.  She was jumping in the air playing around when she fell.  She denies numbness, tingling, weakness in her fingers or hand.  She denies head injury or LOC.  LMP: 01/11/2019.  The history is provided by the patient.    Past Medical History:  Diagnosis Date  . Chlamydia   . Hypertension   . Infection    UTI  . Urinary tract infection   . Vaginal Pap smear, abnormal    f/u was ok    Patient Active Problem List   Diagnosis Date Noted  . Abnormal Pap smear of vagina 01/31/2016    Past Surgical History:  Procedure Laterality Date  . CESAREAN SECTION    . COLPOSCOPY    . DILATION AND EVACUATION N/A 10/04/2012   Procedure: DILATATION AND EVACUATION;  Surgeon: Lesly DukesKelly H Leggett, MD;  Location: [facility];  Service: Gynecology;  Laterality: N/A;  . LEEP  11/2016    OB History    Gravida  4   Para  3   Term  3   Preterm  0   AB  1   Living  3     SAB  0   TAB  1   Ectopic  0   Multiple  0   Live Births  3            Home Medications    Prior to Admission medications   Medication Sig Start Date End Date Taking? Authorizing Provider  amoxicillin (AMOXIL) 875 MG tablet Take 1 tablet (875 mg total) by mouth 2 (two) times daily. 05/02/18   Elvina SidleLauenstein, Kurt, MD  ibuprofen (ADVIL) 600 MG tablet Take 1 tablet (600 mg total) by mouth every 6 (six) hours as needed. 01/15/19   Mickie Bailate, Karisha Marlin H, NP    Family History Family History  Problem Relation Age of Onset  . Diabetes Sister   . Hypertension Paternal Grandmother   . Cancer Paternal Grandfather   . Anesthesia problems Neg Hx   . Hearing loss Neg Hx     Social History Social History   Tobacco Use  .  Smoking status: Former Smoker    Packs/day: 0.25    Quit date: 04/05/2015    Years since quitting: 3.7  . Smokeless tobacco: Never Used  Substance Use Topics  . Alcohol use: Not Currently  . Drug use: Never     Allergies   Patient has no known allergies.   Review of Systems Review of Systems  Constitutional: Negative for chills and fever.  HENT: Negative for ear pain and sore throat.   Eyes: Negative for pain and visual disturbance.  Respiratory: Negative for cough and shortness of breath.   Cardiovascular: Negative for chest pain and palpitations.  Gastrointestinal: Negative for abdominal pain and vomiting.  Genitourinary: Negative for dysuria and hematuria.  Musculoskeletal: Positive for arthralgias. Negative for back pain.  Skin: Negative for color change and rash.  Neurological: Negative for seizures, syncope, weakness and numbness.  All other systems reviewed and are negative.    Physical Exam Triage Vital Signs ED Triage Vitals  Enc Vitals Group  BP      Pulse      Resp      Temp      Temp src      SpO2      Weight      Height      Head Circumference      Peak Flow      Pain Score      Pain Loc      Pain Edu?      Excl. in [place]?    No data found.  Updated Vital Signs BP 117/69 (BP Location: Left Arm)   Pulse 78   Temp 98.5 F (36.9 C) (Oral)   Resp 18   LMP 01/11/2019 (Exact Date)   SpO2 99%   Visual Acuity Right Eye Distance:   Left Eye Distance:   Bilateral Distance:    Right Eye Near:   Left Eye Near:    Bilateral Near:     Physical Exam Vitals signs and nursing note reviewed.  Constitutional:      General: She is not in acute distress.    Appearance: She is well-developed.  HENT:     Head: Normocephalic and atraumatic.  Eyes:     Conjunctiva/sclera: Conjunctivae normal.  Neck:     Musculoskeletal: Neck supple.  Cardiovascular:     Rate and Rhythm: Normal rate and regular rhythm.     Heart sounds: No murmur.  Pulmonary:      Effort: Pulmonary effort is normal. No respiratory distress.     Breath sounds: Normal breath sounds.  Abdominal:     Palpations: Abdomen is soft.     Tenderness: There is no abdominal tenderness.  Musculoskeletal: Normal range of motion.        General: Tenderness present. No swelling or deformity.       Arms:  Skin:    General: Skin is warm and dry.     Capillary Refill: Capillary refill takes less than 2 seconds.     Findings: No bruising, erythema or lesion.  Neurological:     General: No focal deficit present.     Mental Status: She is alert and oriented to person, place, and time.     Sensory: No sensory deficit.     Motor: No weakness.      UC Treatments / Results  Labs (all labs ordered are listed, but only abnormal results are displayed) Labs Reviewed - No data to display  EKG   Radiology Dg Hand Complete Right  Result Date: 01/15/2019 CLINICAL DATA:  Fall on right hand last night. Pain near the base of thumb. EXAM: RIGHT HAND - COMPLETE 3+ VIEW COMPARISON:  None. FINDINGS: There is no evidence of fracture or dislocation. There is no evidence of arthropathy or other focal bone abnormality. Soft tissues are unremarkable. IMPRESSION: Negative. Electronically Signed   By: Misty Stanley M.D.   On: 01/15/2019 12:21    Procedures Procedures (including critical care time)  Medications Ordered in UC Medications - No data to display  Initial Impression / Assessment and Plan / UC Course  I have reviewed the triage vital signs and the nursing notes.  Pertinent labs & imaging results that were available during my care of the patient were reviewed by me and considered in my medical decision making (see chart for details).     Right hand contusion.  Treating with ibuprofen, RICE, ace wrap.  Instructed patient to follow-up with her PCP or orthopedist if her symptoms continue or worsen;  or if she develops new symptoms such as numbness, tingling, weakness or other concerns.   Patient agrees with plan of care.     Final Clinical Impressions(s) / UC Diagnoses   Final diagnoses:  Contusion of right hand, initial encounter     Discharge Instructions     Take the ibuprofen as prescribed.    Rest and elevate your hand.  Apply ice packs 2-3 times a day for up to 20 minutes each.    Wear the Ace wrap as needed for comfort.    Follow up with your primary care provider or an orthopedist if you symptoms continue or worsen;  Or if you develop new symptoms, such as numbness, tingling, or weakness.       ED Prescriptions    Medication Sig Dispense Auth. Provider   ibuprofen (ADVIL) 600 MG tablet Take 1 tablet (600 mg total) by mouth every 6 (six) hours as needed. 30 tablet Mickie Bail, NP     Controlled Substance Prescriptions [place] Controlled Substance Registry consulted? Not Applicable   Mickie Bail, NP 01/15/19 1234

## 2019-01-15 NOTE — Discharge Instructions (Signed)
Take the ibuprofen as prescribed.  Rest and elevate your hand.  Apply ice packs 2-3 times a day for up to 20 minutes each.  Wear the Ace wrap as needed for comfort.    Follow up with your primary care provider or an orthopedist if you symptoms continue or worsen;  Or if you develop new symptoms, such as numbness, tingling, or weakness.    

## 2019-01-15 NOTE — ED Triage Notes (Signed)
Pt presents with right wrist swelling and pain after injuring trying to break her fall last night.

## 2020-03-23 ENCOUNTER — Encounter (HOSPITAL_COMMUNITY): Payer: Self-pay

## 2020-03-23 ENCOUNTER — Emergency Department (HOSPITAL_COMMUNITY): Payer: 59

## 2020-03-23 ENCOUNTER — Encounter (HOSPITAL_COMMUNITY): Payer: Self-pay | Admitting: Emergency Medicine

## 2020-03-23 ENCOUNTER — Other Ambulatory Visit: Payer: Self-pay

## 2020-03-23 ENCOUNTER — Ambulatory Visit (HOSPITAL_COMMUNITY)
Admission: EM | Admit: 2020-03-23 | Discharge: 2020-03-23 | Disposition: A | Discharge status: Home / Self Care | Payer: 59

## 2020-03-23 ENCOUNTER — Emergency Department (HOSPITAL_COMMUNITY)
Admission: EM | Admit: 2020-03-23 | Discharge: 2020-03-24 | Disposition: A | Discharge status: Home / Self Care | Payer: 59 | Attending: Emergency Medicine | Admitting: Emergency Medicine

## 2020-03-23 DIAGNOSIS — U071 COVID-19: Secondary | ICD-10-CM | POA: Insufficient documentation

## 2020-03-23 DIAGNOSIS — I10 Essential (primary) hypertension: Secondary | ICD-10-CM | POA: Insufficient documentation

## 2020-03-23 DIAGNOSIS — R0602 Shortness of breath: Secondary | ICD-10-CM | POA: Diagnosis present

## 2020-03-23 DIAGNOSIS — Z87891 Personal history of nicotine dependence: Secondary | ICD-10-CM | POA: Insufficient documentation

## 2020-03-23 DIAGNOSIS — R0902 Hypoxemia: Secondary | ICD-10-CM

## 2020-03-23 LAB — CBC WITH DIFFERENTIAL/PLATELET
Abs Immature Granulocytes: 0 10*3/uL (ref 0.00–0.07)
Basophils Absolute: 0 10*3/uL (ref 0.0–0.1)
Basophils Relative: 0 %
Eosinophils Absolute: 0 10*3/uL (ref 0.0–0.5)
Eosinophils Relative: 0 %
HCT: 41.3 % (ref 36.0–46.0)
Hemoglobin: 12.7 g/dL (ref 12.0–15.0)
Lymphocytes Relative: 15 %
Lymphs Abs: 0.8 10*3/uL (ref 0.7–4.0)
MCH: 25.6 pg — ABNORMAL LOW (ref 26.0–34.0)
MCHC: 30.8 g/dL (ref 30.0–36.0)
MCV: 83.1 fL (ref 80.0–100.0)
Monocytes Absolute: 0.2 10*3/uL (ref 0.1–1.0)
Monocytes Relative: 4 %
Neutro Abs: 4.3 10*3/uL (ref 1.7–7.7)
Neutrophils Relative %: 81 %
Platelets: 218 10*3/uL (ref 150–400)
RBC: 4.97 MIL/uL (ref 3.87–5.11)
RDW: 14 % (ref 11.5–15.5)
WBC: 5.3 10*3/uL (ref 4.0–10.5)
nRBC: 0 % (ref 0.0–0.2)
nRBC: 0 /100 WBC

## 2020-03-23 LAB — URINALYSIS, ROUTINE W REFLEX MICROSCOPIC
Bilirubin Urine: NEGATIVE
Glucose, UA: NEGATIVE mg/dL
Ketones, ur: 5 mg/dL — AB
Leukocytes,Ua: NEGATIVE
Nitrite: POSITIVE — AB
Protein, ur: >=300 mg/dL — AB
Specific Gravity, Urine: 1.02 (ref 1.005–1.030)
pH: 5 (ref 5.0–8.0)

## 2020-03-23 LAB — COMPREHENSIVE METABOLIC PANEL
ALT: 18 U/L (ref 0–44)
AST: 21 U/L (ref 15–41)
Albumin: 3.5 g/dL (ref 3.5–5.0)
Alkaline Phosphatase: 41 U/L (ref 38–126)
Anion gap: 14 (ref 5–15)
BUN: 8 mg/dL (ref 6–20)
CO2: 22 mmol/L (ref 22–32)
Calcium: 8.4 mg/dL — ABNORMAL LOW (ref 8.9–10.3)
Chloride: 98 mmol/L (ref 98–111)
Creatinine, Ser: 1.07 mg/dL — ABNORMAL HIGH (ref 0.44–1.00)
GFR, Estimated: >60 mL/min (ref >60)
Glucose, Bld: 112 mg/dL — ABNORMAL HIGH (ref 70–99)
Potassium: 3.8 mmol/L (ref 3.5–5.1)
Sodium: 134 mmol/L — ABNORMAL LOW (ref 135–145)
Total Bilirubin: 0.3 mg/dL (ref 0.3–1.2)
Total Protein: 7.1 g/dL (ref 6.5–8.1)

## 2020-03-23 LAB — RESPIRATORY PANEL BY RT PCR (FLU A&B, COVID)
Influenza A by PCR: NEGATIVE
Influenza B by PCR: NEGATIVE
SARS Coronavirus 2 by RT PCR: POSITIVE — AB

## 2020-03-23 LAB — LACTIC ACID, PLASMA: Lactic Acid, Venous: 1.3 mmol/L (ref 0.5–1.9)

## 2020-03-23 MED ORDER — ACETAMINOPHEN 500 MG PO TABS
1000.0000 mg | ORAL_TABLET | Freq: Once | ORAL | Status: AC
  Administered 2020-03-23: 1000 mg via ORAL
  Filled 2020-03-23: qty 2

## 2020-03-23 NOTE — ED Triage Notes (Signed)
Sent by Urgent Care for Covid symptoms, has been short of breath --- took a home covid test -- was positive  Nausea/vomiting/body aches/ short of breath, vomited some blood today.  Daughter has covid (30 yo)--  Unvaccinated

## 2020-03-23 NOTE — ED Notes (Signed)
Patient is being discharged from the Urgent Care Center and sent to the Emergency Department via wheelchair by staff. Per Linus Mako. NP, patient is stable but in need of higher level of care due to covid positive and low oxygen. Patient is aware and verbalizes understanding of plan of care.  Vitals:   03/23/20 1755 03/23/20 1814  BP: (!) 91/48   Pulse: (!) 117   Resp: (!) 31   Temp: (!) 102.9 F (39.4 C)   SpO2: 91% (!) 86%

## 2020-03-23 NOTE — ED Notes (Signed)
Micro called to add urine culture on

## 2020-03-23 NOTE — ED Provider Notes (Signed)
[facility] Provider Note   CSN: [id number] Arrival date & time: 03/23/20  [id number]     History Chief Complaint  Patient presents with  . Covid Positive  . Shortness of Breath    Faith HugeShaundra R [name] is a 29 y.o. female noncontributory past medical history.  Did not have Covid vaccinations.  HPI Patient presents to emergency department today with chief complaint of being Covid positive and shortness of breath.  Patient states she tested positive for Covid x6 days ago with a home test.  Her symptoms have included cough, nasal congestion, body aches and shortness of breath.  She states her shortness of breath has been progressively worsening.  Her cough is reductive.  She states her cough is nonproductive.  She did have an episode of emesis prior to arrival that she noted specks of blood in..  She has tried taking Tylenol, NyQuil and Mucinex without much symptom improvement.  She states she had a fever when she checked her temperature this morning at home and took Tylenol 6 hours prior to arrival.  Patient went to urgent care with chief complaint of shortness of breath.  She was found to be febrile to 102.9 tachycardic to 117, tachypneic and had an O2 of 91% on room air.  She ambulated and O2 dropped to 82%.    Past Medical History:  Diagnosis Date  . Chlamydia   . Hypertension   . Infection    UTI  . Urinary tract infection   . Vaginal Pap smear, abnormal    f/u was ok    Patient Active Problem List   Diagnosis Date Noted  . Abnormal Pap smear of vagina 01/31/2016    Past Surgical History:  Procedure Laterality Date  . CESAREAN SECTION    . COLPOSCOPY    . DILATION AND EVACUATION N/A 10/04/2012   Procedure: DILATATION AND EVACUATION;  Surgeon: Lesly DukesKelly H Leggett, MD;  Location: [facility];  Service: Gynecology;  Laterality: N/A;  . LEEP  11/2016     OB History    Gravida  4   Para  3   Term  3   Preterm  0   AB  1   Living  3      SAB  0   TAB  1   Ectopic  0   Multiple  0   Live Births  3           Family History  Problem Relation Age of Onset  . Diabetes Sister   . Hypertension Paternal Grandmother   . Cancer Paternal Grandfather   . Anesthesia problems Neg Hx   . Hearing loss Neg Hx     Social History   Tobacco Use  . Smoking status: Former Smoker    Packs/day: 0.25    Quit date: 04/05/2015    Years since quitting: 4.9  . Smokeless tobacco: Never Used  Vaping Use  . Vaping Use: Never used  Substance Use Topics  . Alcohol use: Not Currently  . Drug use: Never    Home Medications Prior to Admission medications   Medication Sig Start Date End Date Taking? Authorizing Provider  acetaminophen (TYLENOL) 500 MG tablet Take 500 mg by mouth every 6 (six) hours as needed.    [provider]  amoxicillin (AMOXIL) 875 MG tablet Take 1 tablet (875 mg total) by mouth 2 (two) times daily. 05/02/18   Elvina SidleLauenstein, Kurt, MD  azithromycin (ZITHROMAX Z-PAK) 250 MG tablet Take 2  tablets (500 mg total) by mouth daily for 1 day, THEN 1 tablet (250 mg total) daily for 4 days. 03/24/20 03/29/20  Walisiewicz, Caroleen Hamman, PA-C  dextromethorphan-guaiFENesin (MUCINEX DM) 30-600 MG 12hr tablet Take 1 tablet by mouth 2 (two) times daily.    [provider]  ibuprofen (ADVIL) 600 MG tablet Take 1 tablet (600 mg total) by mouth every 6 (six) hours as needed. 01/15/19   Mickie Bail, NP  Pseudoeph-Doxylamine-DM-APAP (NYQUIL PO) Take by mouth.    [provider]    Allergies    Patient has no known allergies.  Review of Systems   Review of Systems All other systems are reviewed and are negative for acute change except as noted in the HPI.  Physical Exam Updated Vital Signs BP 114/77 (BP Location: Left Arm)   Pulse (!) 107   Temp 99.3 F (37.4 C) (Oral)   Resp (!) 22   LMP 03/12/2020 (Exact Date)   SpO2 96%   Physical Exam Vitals and nursing note reviewed.  Constitutional:       General: She is not in acute distress.    Appearance: She is not ill-appearing.  HENT:     Head: Normocephalic and atraumatic.     Right Ear: Tympanic membrane and external ear normal.     Left Ear: Tympanic membrane and external ear normal.     Nose: Nose normal.     Mouth/Throat:     Mouth: Mucous membranes are moist.     Pharynx: Oropharynx is clear.  Eyes:     General: No scleral icterus.       Right eye: No discharge.        Left eye: No discharge.     Extraocular Movements: Extraocular movements intact.     Conjunctiva/sclera: Conjunctivae normal.     Pupils: Pupils are equal, round, and reactive to light.  Neck:     Vascular: No JVD.  Cardiovascular:     Rate and Rhythm: Normal rate and regular rhythm.     Pulses: Normal pulses.          Radial pulses are 2+ on the right side and 2+ on the left side.     Heart sounds: Normal heart sounds.  Pulmonary:     Comments: Lungs clear to auscultation in all fields. Symmetric chest rise. No wheezing, rales, or rhonchi. Abdominal:     Comments: Abdomen is soft, non-distended, and non-tender in all quadrants. No rigidity, no guarding. No peritoneal signs.  Musculoskeletal:        General: Normal range of motion.     Cervical back: Normal range of motion.  Skin:    General: Skin is warm and dry.     Capillary Refill: Capillary refill takes less than 2 seconds.  Neurological:     Mental Status: She is oriented to person, place, and time.     GCS: GCS eye subscore is 4. GCS verbal subscore is 5. GCS motor subscore is 6.     Comments: Fluent speech, no facial droop.  Psychiatric:        Behavior: Behavior normal.     ED Results / Procedures / Treatments   Labs (all labs ordered are listed, but only abnormal results are displayed) Labs Reviewed  RESPIRATORY PANEL BY RT PCR (FLU A&B, COVID) - Abnormal; Notable for the following components:      Result Value   SARS Coronavirus 2 by RT PCR POSITIVE (*)    All other components  within normal limits  COMPREHENSIVE METABOLIC PANEL - Abnormal; Notable for the following components:   Sodium 134 (*)    Glucose, Bld 112 (*)    Creatinine, Ser 1.07 (*)    Calcium 8.4 (*)    All other components within normal limits  CBC WITH DIFFERENTIAL/PLATELET - Abnormal; Notable for the following components:   MCH 25.6 (*)    All other components within normal limits  URINALYSIS, ROUTINE W REFLEX MICROSCOPIC - Abnormal; Notable for the following components:   APPearance CLOUDY (*)    Hgb urine dipstick SMALL (*)    Ketones, ur 5 (*)    Protein, ur >=300 (*)    Nitrite POSITIVE (*)    Bacteria, UA RARE (*)    All other components within normal limits  URINE CULTURE  LACTIC ACID, PLASMA  LACTIC ACID, PLASMA  I-STAT BETA HCG BLOOD, ED ([facility], [facility], [facility] ONLY)    EKG None  Radiology DG Chest Portable 1 View  Result Date: 03/23/2020 CLINICAL DATA:  COVID EXAM: PORTABLE CHEST 1 VIEW COMPARISON:  March 15, 2018 FINDINGS: The heart size and mediastinal contours are within normal limits. Hazy airspace opacity seen at the right lung base. The visualized skeletal structures are unremarkable. IMPRESSION: Hazy airspace opacity at the right lung base which likely due to infectious etiology. Electronically Signed   By: Jonna Clark M.D.   On: 03/23/2020 20:08    Procedures Procedures (including critical care time)  Medications Ordered in ED Medications  acetaminophen (TYLENOL) tablet 1,000 mg (1,000 mg Oral Given 03/23/20 1845)  dexamethasone (DECADRON) tablet 6 mg (6 mg Oral Given 03/24/20 0039)    ED Course  I have reviewed the triage vital signs and the nursing notes.  Pertinent labs & imaging results that were available during my care of the patient were reviewed by me and considered in my medical decision making (see chart for details).  Vitals:   03/24/20 0015 03/24/20 0030 03/24/20 0100 03/24/20 0115  BP: 122/74 134/84 113/73 135/82  Pulse: 99  (!) 101   Resp: (!) 26  (!) 24 (!) 35   Temp:      TempSrc:      SpO2: 100%  100%     Clinical Course as of Mar 25 227  Sat Mar 24, 2020  0100 [phone]   [KW]    Clinical Course User Index [KW] Kandice Hams   MDM Rules/Calculators/A&P                          History provided by patient with additional history obtained from chart review.    29 year old female presenting with covid symptoms x 6 days.  In triage she was febrile to 103.2, tachycardic with soft blood pressures and borderline hypoxia.  She had prolonged wait time of approximately 4 hours in the lobby secondary to high volume.  She was given Tylenol for fever in triage.  On my initial exam patient is resting comfortably.  She is afebrile, heart rate ranges from 90-104 and she is normotensive.  Oxygen saturation on room air is 95%.  She does not appear to be in respiratory distress.  On exam lung sounds diminished in right lower lobe, no wheezing, rales or rhonchi heard.  Abdomen is nontender. Labs were collected in triage.  I viewed results which show unremarkable CBC.  CMP with slight bump in creatinine at 1.07, otherwise no significant electrolyte derangement, no transaminitis.  UA with possible  infection as she has positive nitrites and rare bacteria with 11-20 WBC.  She has no urinary symptoms, will send for urine culture and hold off on antibiotics at this time.  Lactic acid was within normal range.  Chest x-ray viewed by me shows opacity in right lung base, possible infection.  Decadron given.  Patient ambulated without hypoxia or worsening shortness of breath.  Oxygen saturation stayed above 94% with ambulation.  I engaged in shared decision-making with patient regarding disposition.  She feels that she can manage her symptoms at home and prefers to be discharged.  Will cover for possible secondary bacterial infection with a Z-Pak.  We will also give incentive spirometer to encourage deep breathing.  Discussed strict return  precautions with patient regarding hypoxia, worsening shortness of breath, uncontrollable fever or nausea and vomiting.  She know to stay well-hydrated and she already has a pulse ox meter at home she plans to use.   The patient appears reasonably screened and/or stabilized for discharge and I doubt any other medical condition or other [facility] requiring further screening, evaluation, or treatment in the ED at this time prior to discharge. The patient is safe for discharge with strict return precautions discussed. Recommend pcp follow up if needed. Findings and plan of care discussed with supervising physician Dr. Elesa Massed.   Faith [name] was evaluated in Emergency Department on 03/24/2020 for the symptoms described in the history of present illness. She was evaluated in the context of the global COVID-19 pandemic, which necessitated consideration that the patient might be at risk for infection with the SARS-CoV-2 virus that causes COVID-19. Institutional protocols and algorithms that pertain to the evaluation of patients at risk for COVID-19 are in a state of rapid change based on information released by regulatory bodies including the CDC and federal and state organizations. These policies and algorithms were followed during the patient's care in the ED.   Portions of this note were generated with Scientist, clinical (histocompatibility and immunogenetics). Dictation errors may occur despite best attempts at proofreading.  Final Clinical Impression(s) / ED Diagnoses Final diagnoses:  COVID    Rx / DC Orders ED Discharge Orders         Ordered    azithromycin (ZITHROMAX Z-PAK) 250 MG tablet        03/24/20 0106           Shanon Ace, PA-C 03/24/20 0229    Ward, Layla Maw, DO 03/24/20 [phone]

## 2020-03-23 NOTE — ED Provider Notes (Signed)
Faith [name] presents with complaints of shortness of breath . Per nursing, tested positive by home test 6 days ago for Covid-19. Nursing request this provider to bedside with triage as patient is found to have bp of 91/48, temp of 102.9, hr of 117, RR of 31 and O2 of 91%. With ambulation, pulse Ox drops to around 84%, even down to 82%. With this exertional hypoxia as well as other abnormal vital signs recommend further evaluation and treatment in the ER. Patient's father will drive her there now. Wheel chair assistance provided to her vehicle.       Georgetta Haber, NP 03/23/20 1816

## 2020-03-23 NOTE — ED Triage Notes (Signed)
Pt reports cough, nasal congestion, body aches and shortness of breath x 6 days. States she tested positive for COVID, with a home test,  6 days ago. Pt requested xray. Tylenol, NyQuil and Mucinex gives some relief for the cough., last diose over 6 hrs ago.

## 2020-03-23 NOTE — Discharge Instructions (Signed)
Please go to the ER now for further evaluation and treatment.

## 2020-03-24 ENCOUNTER — Ambulatory Visit (HOSPITAL_COMMUNITY)
Admission: RE | Admit: 2020-03-24 | Discharge: 2020-03-24 | Disposition: A | Discharge status: Home / Self Care | Payer: 59 | Source: Ambulatory Visit | Attending: Pulmonary Disease | Admitting: Pulmonary Disease

## 2020-03-24 ENCOUNTER — Telehealth: Payer: Self-pay | Admitting: Physician Assistant

## 2020-03-24 ENCOUNTER — Telehealth: Payer: Self-pay | Admitting: Unknown Physician Specialty

## 2020-03-24 ENCOUNTER — Other Ambulatory Visit: Payer: Self-pay | Admitting: Unknown Physician Specialty

## 2020-03-24 ENCOUNTER — Encounter: Payer: Self-pay | Admitting: Physician Assistant

## 2020-03-24 ENCOUNTER — Encounter: Payer: Self-pay | Admitting: Unknown Physician Specialty

## 2020-03-24 ENCOUNTER — Telehealth (HOSPITAL_COMMUNITY): Payer: Self-pay

## 2020-03-24 DIAGNOSIS — U071 COVID-19: Secondary | ICD-10-CM

## 2020-03-24 DIAGNOSIS — Z6829 Body mass index (BMI) 29.0-29.9, adult: Secondary | ICD-10-CM | POA: Insufficient documentation

## 2020-03-24 DIAGNOSIS — E663 Overweight: Secondary | ICD-10-CM | POA: Insufficient documentation

## 2020-03-24 MED ORDER — SODIUM CHLORIDE 0.9 % IV SOLN: Freq: Once | INTRAVENOUS | Status: AC

## 2020-03-24 MED ORDER — SOTROVIMAB 500 MG/8ML IV SOLN
500.0000 mg | Freq: Once | INTRAVENOUS | Status: AC
  Administered 2020-03-24: 500 mg via INTRAVENOUS

## 2020-03-24 MED ORDER — DIPHENHYDRAMINE HCL 50 MG/ML IJ SOLN: 50.0000 mg | Freq: Once | INTRAMUSCULAR | Status: DC | PRN

## 2020-03-24 MED ORDER — ALBUTEROL SULFATE HFA 108 (90 BASE) MCG/ACT IN AERS: 2.0000 | INHALATION_SPRAY | Freq: Once | RESPIRATORY_TRACT | Status: DC | PRN

## 2020-03-24 MED ORDER — AZITHROMYCIN 250 MG PO TABS: ORAL_TABLET | ORAL | 0 refills | Status: AC

## 2020-03-24 MED ORDER — DEXAMETHASONE 4 MG PO TABS
6.0000 mg | ORAL_TABLET | Freq: Once | ORAL | Status: AC
  Administered 2020-03-24: 6 mg via ORAL
  Filled 2020-03-24: qty 2

## 2020-03-24 MED ORDER — METHYLPREDNISOLONE SODIUM SUCC 125 MG IJ SOLR: 125.0000 mg | Freq: Once | INTRAMUSCULAR | Status: DC | PRN

## 2020-03-24 MED ORDER — SODIUM CHLORIDE 0.9 % IV SOLN: INTRAVENOUS | Status: DC | PRN

## 2020-03-24 MED ORDER — FAMOTIDINE IN NACL 20-0.9 MG/50ML-% IV SOLN: 20.0000 mg | Freq: Once | INTRAVENOUS | Status: DC | PRN

## 2020-03-24 MED ORDER — EPINEPHRINE 0.3 MG/0.3ML IJ SOAJ: 0.3000 mg | Freq: Once | INTRAMUSCULAR | Status: DC | PRN

## 2020-03-24 NOTE — Progress Notes (Signed)
Patient reviewed Fact Sheet for Patients, Parents, and Caregivers for Emergency Use Authorization (EUA) of Sotrovimab for the Treatment of Coronavirus.  Patient also reviewed and is agreeable to the estimated cost of treatment.  States [employer] is closed, but she is agreeable to cost and to proceeding.

## 2020-03-24 NOTE — Discharge Instructions (Addendum)
Thank you for allowing [place] to care for you today.   -Someone from the infusion clinic will be calling you about the antibodies infusion.  Make sure you answer your phone when the call so that you can set something up to schedule.  -Prescription for azithromycin has been sent to your pharmacy this is to cover you for possible bacterial infection.  Please return to the emergency department if you have any new or worsening symptoms.  You tested positive for covid-19 today.   Medications- You can take medications to help treat your symptoms: -Tylenol for fever and body aches. Please take as prescribed on the bottle. -Over the coutner cough medicine such as mucinex, robitussin, or other brands. -Flonase or saline nasal spray for nasal congestion -Vitamins as recommended by CDC  -Use the incentive spirometer to help encourage taking deep breaths.  You can use it while watching TV on commercial breaks.  Treatment- This is a virus and unfortunately there are no antibitotics approved to treat this virus at this time. It is important to monitor your symptoms closely: -You should have a theremometer at home to check your temperature when feeling feverish. -Use a pulse ox meter to measure your oxygen when feeling short of breath.  -If your fever is over 100.4 despite taking tylenol or if your oxygen level drops below 92% these are reasons to return to the emergency department for further evaluation.   -You need to quarantine for 10 days starting today.  You can return to work, school or normal activities if on day 10 you are fever free without the use of Tylenol or ibuprofen.  You will need to continue quarantine if you still have a fever over 100.4.  Again: symptoms of shortness of breath, chest pain, difficulty breathing, new onset of confusion, any symptoms that are concerning. If any of these symptoms you should come to emergency department for evaluation.   I hope you feel better soon

## 2020-03-24 NOTE — Discharge Instructions (Signed)

## 2020-03-24 NOTE — Telephone Encounter (Signed)
Called to Discuss with patient about Covid symptoms and the use of the monoclonal antibody infusion for those with mild to moderate Covid symptoms and at a high risk of hospitalization.     Pt appears to qualify for this infusion due to co-morbid conditions and/or a member of an at-risk group in accordance with the FDA Emergency Use Authorization.    Unable to reach pt. Left VM message at 11:26 to return call at COVID hotline [phone].

## 2020-03-24 NOTE — Telephone Encounter (Signed)
Called to discuss with patient about Covid symptoms and the use of sotrovimab, bamlanivimab/etesevimab or casirivimab/imdevimab, a monoclonal antibody infusion for those with mild to moderate Covid symptoms and at a high risk of hospitalization.  Pt is qualified for this infusion at the [place] infusion center due to; Specific high risk criteria : BMI > 25 and Cardiovascular disease or hypertension   Message left to call back our hotline [phone]. My chart message sent if active on Mychart.   Jora Galluzzo PA-C  

## 2020-03-24 NOTE — Telephone Encounter (Signed)
I connected by phone with Faith [name] on 03/24/2020 at 11:21 AM to discuss the potential use of a new treatment for mild to moderate COVID-19 viral infection in non-hospitalized patients.  This patient is a 29 y.o. female that meets the FDA criteria for Emergency Use Authorization of COVID monoclonal antibody casirivimab/imdevimab, bamlanivimab/eteseviamb, or sotrovimab.  Has a (+) direct SARS-CoV-2 viral test result  Has mild or moderate COVID-19   Is NOT hospitalized due to COVID-19  Is within 10 days of symptom onset  Has at least one of the high risk factor(s) for progression to severe COVID-19 and/or hospitalization as defined in EUA.  Specific high risk criteria : BMI > 25   I have spoken and communicated the following to the patient or parent/caregiver regarding COVID monoclonal antibody treatment:  1. FDA has authorized the emergency use for the treatment of mild to moderate COVID-19 in adults and pediatric patients with positive results of direct SARS-CoV-2 viral testing who are 24 years of age and older weighing at least 40 kg, and who are at high risk for progressing to severe COVID-19 and/or hospitalization.  2. The significant known and potential risks and benefits of COVID monoclonal antibody, and the extent to which such potential risks and benefits are unknown.  3. Information on available alternative treatments and the risks and benefits of those alternatives, including clinical trials.  4. Patients treated with COVID monoclonal antibody should continue to self-isolate and use infection control measures (e.g., wear mask, isolate, social distance, avoid sharing personal items, clean and disinfect high touch surfaces, and frequent handwashing) according to CDC guidelines.   5. The patient or parent/caregiver has the option to accept or refuse COVID monoclonal antibody treatment.  After reviewing this information with the patient, the patient has agreed to  receive one of the available covid 19 monoclonal antibodies and will be provided an appropriate fact sheet prior to infusion. Gabriel Cirri, NP 03/24/2020 11:21 AM  Sx onset 11/13

## 2020-03-25 LAB — URINE CULTURE

## 2020-04-02 ENCOUNTER — Other Ambulatory Visit: Payer: Self-pay

## 2020-04-02 ENCOUNTER — Inpatient Hospital Stay (HOSPITAL_COMMUNITY)
Admission: EM | Admit: 2020-04-02 | Discharge: 2020-04-11 | DRG: 177 | Disposition: A | Discharge status: Home Health | Payer: 59 | Attending: Internal Medicine | Admitting: Internal Medicine

## 2020-04-02 ENCOUNTER — Encounter (HOSPITAL_COMMUNITY): Payer: Self-pay

## 2020-04-02 ENCOUNTER — Emergency Department (HOSPITAL_COMMUNITY): Payer: 59

## 2020-04-02 DIAGNOSIS — U071 COVID-19: Principal | ICD-10-CM | POA: Diagnosis present

## 2020-04-02 DIAGNOSIS — J159 Unspecified bacterial pneumonia: Secondary | ICD-10-CM | POA: Diagnosis present

## 2020-04-02 DIAGNOSIS — J1282 Pneumonia due to coronavirus disease 2019: Secondary | ICD-10-CM | POA: Diagnosis present

## 2020-04-02 DIAGNOSIS — R791 Abnormal coagulation profile: Secondary | ICD-10-CM | POA: Diagnosis not present

## 2020-04-02 DIAGNOSIS — J9601 Acute respiratory failure with hypoxia: Secondary | ICD-10-CM | POA: Diagnosis present

## 2020-04-02 DIAGNOSIS — I2699 Other pulmonary embolism without acute cor pulmonale: Secondary | ICD-10-CM

## 2020-04-02 DIAGNOSIS — E860 Dehydration: Secondary | ICD-10-CM | POA: Diagnosis present

## 2020-04-02 DIAGNOSIS — Z8249 Family history of ischemic heart disease and other diseases of the circulatory system: Secondary | ICD-10-CM | POA: Diagnosis not present

## 2020-04-02 DIAGNOSIS — Z6841 Body Mass Index (BMI) 40.0 and over, adult: Secondary | ICD-10-CM | POA: Diagnosis not present

## 2020-04-02 DIAGNOSIS — R079 Chest pain, unspecified: Secondary | ICD-10-CM

## 2020-04-02 DIAGNOSIS — R739 Hyperglycemia, unspecified: Secondary | ICD-10-CM | POA: Diagnosis not present

## 2020-04-02 DIAGNOSIS — I2602 Saddle embolus of pulmonary artery with acute cor pulmonale: Secondary | ICD-10-CM | POA: Diagnosis not present

## 2020-04-02 DIAGNOSIS — I2694 Multiple subsegmental pulmonary emboli without acute cor pulmonale: Secondary | ICD-10-CM | POA: Diagnosis present

## 2020-04-02 DIAGNOSIS — I10 Essential (primary) hypertension: Secondary | ICD-10-CM | POA: Diagnosis present

## 2020-04-02 DIAGNOSIS — Z833 Family history of diabetes mellitus: Secondary | ICD-10-CM | POA: Diagnosis not present

## 2020-04-02 LAB — BASIC METABOLIC PANEL WITH GFR
Anion gap: 13 (ref 5–15)
BUN: 8 mg/dL (ref 6–20)
CO2: 21 mmol/L — ABNORMAL LOW (ref 22–32)
Calcium: 9.3 mg/dL (ref 8.9–10.3)
Chloride: 103 mmol/L (ref 98–111)
Creatinine, Ser: 0.82 mg/dL (ref 0.44–1.00)
GFR, Estimated: >60 mL/min
Glucose, Bld: 125 mg/dL — ABNORMAL HIGH (ref 70–99)
Potassium: 4.2 mmol/L (ref 3.5–5.1)
Sodium: 137 mmol/L (ref 135–145)

## 2020-04-02 LAB — D-DIMER, QUANTITATIVE: D-Dimer, Quant: 3.39 ug/mL-FEU — ABNORMAL HIGH (ref 0.00–0.50)

## 2020-04-02 LAB — CBC
HCT: 36.8 % (ref 36.0–46.0)
Hemoglobin: 11.2 g/dL — ABNORMAL LOW (ref 12.0–15.0)
MCH: 25.5 pg — ABNORMAL LOW (ref 26.0–34.0)
MCHC: 30.4 g/dL (ref 30.0–36.0)
MCV: 83.8 fL (ref 80.0–100.0)
Platelets: 402 K/uL — ABNORMAL HIGH (ref 150–400)
RBC: 4.39 MIL/uL (ref 3.87–5.11)
RDW: 14.2 % (ref 11.5–15.5)
WBC: 19.9 K/uL — ABNORMAL HIGH (ref 4.0–10.5)
nRBC: 0 % (ref 0.0–0.2)

## 2020-04-02 LAB — I-STAT BETA HCG BLOOD, ED (MC, WL, AP ONLY): I-stat hCG, quantitative: <5 m[IU]/mL (ref <5)

## 2020-04-02 LAB — TROPONIN I (HIGH SENSITIVITY)
Troponin I (High Sensitivity): <2 ng/L
Troponin I (High Sensitivity): 4 ng/L (ref <18)

## 2020-04-02 MED ORDER — ENOXAPARIN SODIUM 120 MG/0.8ML ~~LOC~~ SOLN
1.0000 mg/kg | Freq: Two times a day (BID) | SUBCUTANEOUS | Status: DC
  Administered 2020-04-02 – 2020-04-05 (×6): 105 mg via SUBCUTANEOUS
  Filled 2020-04-02 (×6): qty 0.7

## 2020-04-02 MED ORDER — ONDANSETRON HCL 4 MG/2ML IJ SOLN: 4.0000 mg | Freq: Four times a day (QID) | INTRAMUSCULAR | Status: DC | PRN

## 2020-04-02 MED ORDER — SODIUM CHLORIDE 0.9 % IV BOLUS
1000.0000 mL | Freq: Once | INTRAVENOUS | Status: AC
  Administered 2020-04-02: 1000 mL via INTRAVENOUS

## 2020-04-02 MED ORDER — ONDANSETRON HCL 4 MG PO TABS: 4.0000 mg | ORAL_TABLET | Freq: Four times a day (QID) | ORAL | Status: DC | PRN

## 2020-04-02 MED ORDER — KETOROLAC TROMETHAMINE 30 MG/ML IJ SOLN
15.0000 mg | Freq: Once | INTRAMUSCULAR | Status: AC
  Administered 2020-04-02: 15 mg via INTRAVENOUS
  Filled 2020-04-02: qty 1

## 2020-04-02 MED ORDER — GUAIFENESIN-DM 100-10 MG/5ML PO SYRP
10.0000 mL | ORAL_SOLUTION | ORAL | Status: DC | PRN
  Administered 2020-04-06 – 2020-04-08 (×6): 10 mL via ORAL
  Filled 2020-04-02 (×7): qty 10

## 2020-04-02 MED ORDER — IOHEXOL 350 MG/ML SOLN
75.0000 mL | Freq: Once | INTRAVENOUS | Status: AC | PRN
  Administered 2020-04-02: 75 mL via INTRAVENOUS

## 2020-04-02 MED ORDER — IBUPROFEN 400 MG PO TABS
400.0000 mg | ORAL_TABLET | Freq: Once | ORAL | Status: AC | PRN
  Administered 2020-04-02: 400 mg via ORAL
  Filled 2020-04-02: qty 1

## 2020-04-02 MED ORDER — ACETAMINOPHEN 325 MG PO TABS: 650.0000 mg | ORAL_TABLET | Freq: Four times a day (QID) | ORAL | Status: DC | PRN

## 2020-04-02 MED ORDER — ONDANSETRON HCL 4 MG/2ML IJ SOLN
4.0000 mg | Freq: Once | INTRAMUSCULAR | Status: AC
  Administered 2020-04-02: 4 mg via INTRAVENOUS
  Filled 2020-04-02: qty 2

## 2020-04-02 MED ORDER — IPRATROPIUM-ALBUTEROL 20-100 MCG/ACT IN AERS
1.0000 | INHALATION_SPRAY | Freq: Four times a day (QID) | RESPIRATORY_TRACT | Status: DC
  Administered 2020-04-03 – 2020-04-08 (×20): 1 via RESPIRATORY_TRACT
  Filled 2020-04-02 (×2): qty 4

## 2020-04-02 MED ORDER — HYDROCOD POLST-CPM POLST ER 10-8 MG/5ML PO SUER
5.0000 mL | Freq: Two times a day (BID) | ORAL | Status: DC | PRN
  Administered 2020-04-08 – 2020-04-10 (×3): 5 mL via ORAL
  Filled 2020-04-02 (×3): qty 5

## 2020-04-02 MED ORDER — TRAMADOL HCL 50 MG PO TABS
50.0000 mg | ORAL_TABLET | Freq: Four times a day (QID) | ORAL | Status: DC | PRN
  Administered 2020-04-02 – 2020-04-04 (×4): 50 mg via ORAL
  Filled 2020-04-02 (×4): qty 1

## 2020-04-02 NOTE — Progress Notes (Signed)
ANTICOAGULATION CONSULT NOTE - Initial Consult  Pharmacy Consult for Lovenox Indication: pulmonary embolus  No Known Allergies  Patient Measurements: Height: 5\' 1"  (154.9 cm) Weight: 104.3 kg (230 lb) IBW/kg (Calculated) : 47.8  Vital Signs: Temp: 98.8 F (37.1 C) (11/29 1859) Temp Source: Oral (11/29 1859) BP: 133/90 (11/29 2000) Pulse Rate: 96 (11/29 2000)  Labs: Recent Labs    04/02/20 1119 04/02/20 1848  HGB 11.2*  --   HCT 36.8  --   PLT 402*  --   CREATININE 0.82  --   TROPONINIHS <2 4    Estimated Creatinine Clearance: 111.5 mL/min (by C-G formula based on SCr of 0.82 mg/dL).   Medical History: Past Medical History:  Diagnosis Date  . Chlamydia   . Hypertension   . Infection    UTI  . Morbid obesity (HCC)   . Urinary tract infection   . Vaginal Pap smear, abnormal    f/u was ok    Assessment: 29 y.o. female presents 10 days after being diagnosed with COVID-19 infection, now with chest pain, found to have pulmonary embolism. Pharmacy has been consulted to start Lovenox.   Goal of Therapy:  Anti-Xa level 0.6-1 units/ml 4hrs after LMWH dose given Monitor platelets by anticoagulation protocol: Yes   Plan:  Lovenox 105 mg subQ q12h Monitor CBC, renal function, s/sx bleeding  26, PharmD PGY1 Pharmacy Resident 04/02/2020 9:29 PM  Please check [email or web address] for unit-specific pharmacy phone numbers.

## 2020-04-02 NOTE — ED Triage Notes (Signed)
Pt reports chest pain and back pain that started yesterday. Denies SOB. Pt tested positive for COVID 10 days ago.

## 2020-04-02 NOTE — H&P (Signed)
History and Physical   Faith [name] ZOX:[id number] DOB: 1990/10/16 DOA: 04/02/2020  Referring MD/NP/PA: Dr. Jeraldine Loots  PCP: Patient, No Pcp Per   Outpatient Specialists: None  Patient coming from: Home  Chief Complaint: Shortness of breath  HPI: Faith [name] is a 29 y.o. female with medical history significant of essential hypertension not on any medication, morbid obesity, who was diagnosed with COVID-19 pneumonia about 10 days ago.  Patient recently received monoclonal antibody infusion for the COVID-19.  She continues to have significant shortness of breath some chest pain and some cough.  Did not have significant pneumonia until yesterday when she started having chest pain right flank pain and nausea vomiting.  Patient was not vaccinated.  She has been recovering well from the COVID-19 until this episode.  She was evaluated in the ER and now found to have bilateral pulmonary emboli.  Patient is therefore being admitted with what is suspected to be COVID-19 induced pulmonary embolism.  She denied any other complaint.  She is not hypoxic and not requiring much oxygen except with exertion.  No hemoptysis.  No prior pulmonary embolism and no family history.  Patient is not a smoker and not on any birth control pills..  ED Course: Temperature 98.8 blood pressure 133/90 pulse 104 respirate 30 oxygen sat 97% on room air.  Chemistry showed glucose 125 otherwise no significant findings.  D-dimer 3.39 white count 19.9 hemoglobin 11.2 and platelets 402.  Chest x-ray showed bilateral infiltrates..  CT angiogram of the chest shows diffuse bilateral infiltrates with subsegmental bilateral pulmonary emboli.  Patient being admitted after initiating of subcu Lovenox  Review of Systems: As per HPI otherwise 10 point review of systems negative.    Past Medical History:  Diagnosis Date  . Chlamydia   . Hypertension   . Infection    UTI  . Morbid obesity (HCC)   . Urinary tract infection    . Vaginal Pap smear, abnormal    f/u was ok    Past Surgical History:  Procedure Laterality Date  . CESAREAN SECTION    . COLPOSCOPY    . DILATION AND EVACUATION N/A 10/04/2012   Procedure: DILATATION AND EVACUATION;  Surgeon: Lesly Dukes, MD;  Location: [facility];  Service: Gynecology;  Laterality: N/A;  . LEEP  11/2016     reports that she quit smoking about 4 years ago. She smoked 0.25 packs per day. She has never used smokeless tobacco. She reports previous alcohol use. She reports that she does not use drugs.  No Known Allergies  Family History  Problem Relation Age of Onset  . Diabetes Sister   . Hypertension Paternal Grandmother   . Cancer Paternal Grandfather   . Anesthesia problems Neg Hx   . Hearing loss Neg Hx      Prior to Admission medications   Medication Sig Start Date End Date Taking? Authorizing Provider  acetaminophen (TYLENOL) 500 MG tablet Take 500 mg by mouth every 6 (six) hours as needed for mild pain.    Yes [provider]  amoxicillin (AMOXIL) 875 MG tablet Take 1 tablet (875 mg total) by mouth 2 (two) times daily. Patient not taking: Reported on 04/02/2020 05/02/18   Elvina Sidle, MD  ibuprofen (ADVIL) 600 MG tablet Take 1 tablet (600 mg total) by mouth every 6 (six) hours as needed. Patient not taking: Reported on 04/02/2020 01/15/19   Mickie Bail, NP  Pseudoeph-Doxylamine-DM-APAP (NYQUIL PO) Take by mouth. Patient not taking: Reported on 04/02/2020  [provider]    Physical Exam: Vitals:   04/02/20 2210 04/02/20 2215 04/02/20 2230 04/02/20 2245  BP: 125/89 (!) 114/100 137/85 140/83  Pulse: 100 (!) 104    Resp: (!) 22 (!) 21 (!) 24 (!) 29  Temp:      TempSrc:      SpO2: 97% 97%    Weight:      Height:          Constitutional: Acutely ill looking, no distress, morbidly obese Vitals:   04/02/20 2210 04/02/20 2215 04/02/20 2230 04/02/20 2245  BP: 125/89 (!) 114/100 137/85 140/83  Pulse: 100 (!) 104    Resp:  (!) 22 (!) 21 (!) 24 (!) 29  Temp:      TempSrc:      SpO2: 97% 97%    Weight:      Height:       Eyes: PERRL, lids and conjunctivae normal ENMT: Mucous membranes are moist. Posterior pharynx clear of any exudate or lesions.Normal dentition.  Neck: normal, supple, no masses, no thyromegaly Respiratory: Coarse breath sounds, no wheeze or rales. Normal respiratory effort. No accessory muscle use.  Cardiovascular: Regular rate and rhythm, no murmurs / rubs / gallops. No extremity edema. 2+ pedal pulses. No carotid bruits.  Abdomen: no tenderness, no masses palpated. No hepatosplenomegaly. Bowel sounds positive.  Musculoskeletal: no clubbing / cyanosis. No joint deformity upper and lower extremities. Good ROM, no contractures. Normal muscle tone.  Skin: no rashes, lesions, ulcers. No induration Neurologic: CN 2-12 grossly intact. Sensation intact, DTR normal. Strength 5/5 in all 4.  Psychiatric: Normal judgment and insight. Alert and oriented x 3. Normal mood.     Labs on Admission: I have personally reviewed following labs and imaging studies  CBC: Recent Labs  Lab 04/02/20 1119  WBC 19.9*  HGB 11.2*  HCT 36.8  MCV 83.8  PLT 402*   Basic Metabolic Panel: Recent Labs  Lab 04/02/20 1119  NA 137  K 4.2  CL 103  CO2 21*  GLUCOSE 125*  BUN 8  CREATININE 0.82  CALCIUM 9.3   GFR: Estimated Creatinine Clearance: 111.5 mL/min (by C-G formula based on SCr of 0.82 mg/dL). Liver Function Tests: No results for input(s): AST, ALT, ALKPHOS, BILITOT, PROT, ALBUMIN in the last 168 hours. No results for input(s): LIPASE, AMYLASE in the last 168 hours. No results for input(s): AMMONIA in the last 168 hours. Coagulation Profile: No results for input(s): INR, PROTIME in the last 168 hours. Cardiac Enzymes: No results for input(s): CKTOTAL, CKMB, CKMBINDEX, TROPONINI in the last 168 hours. BNP (last 3 results) No results for input(s): PROBNP in the last 8760 hours. HbA1C: No results  for input(s): HGBA1C in the last 72 hours. CBG: No results for input(s): GLUCAP in the last 168 hours. Lipid Profile: No results for input(s): CHOL, HDL, LDLCALC, TRIG, CHOLHDL, LDLDIRECT in the last 72 hours. Thyroid Function Tests: No results for input(s): TSH, T4TOTAL, FREET4, T3FREE, THYROIDAB in the last 72 hours. Anemia Panel: No results for input(s): VITAMINB12, FOLATE, FERRITIN, TIBC, IRON, RETICCTPCT in the last 72 hours. Urine analysis:    Component Value Date/Time   COLORURINE YELLOW 03/23/2020 1958   APPEARANCEUR CLOUDY (A) 03/23/2020 1958   LABSPEC 1.020 03/23/2020 1958   PHURINE 5.0 03/23/2020 1958   GLUCOSEU NEGATIVE 03/23/2020 1958   HGBUR SMALL (A) 03/23/2020 1958   BILIRUBINUR NEGATIVE 03/23/2020 1958   BILIRUBINUR neg 07/29/2016 1656   KETONESUR 5 (A) 03/23/2020 1958   PROTEINUR >=300 (  A) 03/23/2020 1958   UROBILINOGEN 0.2 07/29/2016 1656   UROBILINOGEN 0.2 08/02/2013 1605   NITRITE POSITIVE (A) 03/23/2020 1958   LEUKOCYTESUR NEGATIVE 03/23/2020 1958   Sepsis Labs: @LABRCNTIP (procalcitonin:4,lacticidven:4) )No results found for this or any previous visit (from the past 240 hour(s)).   Radiological Exams on Admission: DG Chest 1 View  Result Date: 04/02/2020 CLINICAL DATA:  Shortness of breath, cough and low back pain. The patient tested positive for COVID-19 10 days ago. EXAM: CHEST  1 VIEW COMPARISON:  PA and lateral chest 03/15/2018. Single-view of the chest 03/23/2020. FINDINGS: Lung volumes are low. Streaky bibasilar airspace opacities are greater on the left and unchanged. No pneumothorax or pleural effusion. Heart size is normal. No acute or focal bony abnormality. IMPRESSION: Left greater than right streaky opacities are unchanged since the most recent examination and could be due to pneumonia or atelectasis. Electronically Signed   By: Drusilla Kannerhomas  Dalessio M.D.   On: 04/02/2020 11:50   CT Angio Chest PE W and/or Wo Contrast  Result Date:  04/02/2020 CLINICAL DATA:  Chest and back pain since yesterday, COVID-19 positive 10 days ago EXAM: CT ANGIOGRAPHY CHEST WITH CONTRAST TECHNIQUE: Multidetector CT imaging of the chest was performed using the standard protocol during bolus administration of intravenous contrast. Multiplanar CT image reconstructions and MIPs were obtained to evaluate the vascular anatomy. CONTRAST:  75mL OMNIPAQUE IOHEXOL 350 MG/ML SOLN COMPARISON:  04/02/2020 FINDINGS: Cardiovascular: This is a technically adequate evaluation of the pulmonary vasculature. There are bilateral segmental pulmonary emboli, greatest in the lower lobes. There is moderate clot burden, but no evidence of right heart strain. No pericardial effusion.  Thoracic aorta is unremarkable. Mediastinum/Nodes: No enlarged mediastinal, hilar, or axillary lymph nodes. Thyroid gland, trachea, and esophagus demonstrate no significant findings. Lungs/Pleura: Patchy bilateral perihilar ground-glass airspace disease consistent with multifocal pneumonia and given history of COVID-19. Areas of denser consolidation within the dependent lower lobes compatible with atelectasis or infarct. Trace right pleural effusion. No pneumothorax. Central airways are patent. Upper Abdomen: No acute abnormality. Musculoskeletal: No acute or destructive bony lesions. Reconstructed images demonstrate no additional findings. Review of the MIP images confirms the above findings. IMPRESSION: 1. Bilateral segmental pulmonary emboli, with no evidence of right heart strain. 2. Patchy bilateral perihilar ground-glass airspace disease consistent with multifocal pneumonia, compatible with history of COVID 19. 3. Trace right pleural effusion. 4. Bibasilar areas of consolidation consistent with atelectasis or developing pulmonary infarct. Critical Value/emergent results were called by telephone at the time of interpretation on 04/02/2020 at 9:09 pm to provider Gerhard MunchOBERT LOCKWOOD , who verbally acknowledged  these results. Electronically Signed   By: Sharlet SalinaMichael  Brown M.D.   On: 04/02/2020 21:10    EKG: Independently reviewed.  It shows sinus tachycardia with a rate of 102.  No significant ST changes.  Evidence of LVH by voltage criteria.  Assessment/Plan Principal Problem:   Pulmonary embolism (HCC) Active Problems:   Morbid obesity (HCC)   COVID-19 virus infection     #1 bilateral pulmonary emboli: Most likely induced by COVID-19 infection.  Patient will be admitted.  Initiated on subcu Lovenox.  Oxygen and breathing treatment.  If and when she stabilizes will likely be transition to oral Eliquis.  May need to get echocardiogram if possible but may be delayed due to COVID-19 infection.  No evidence of right heart strain.  #2 COVID-19 pneumonia: Patient has been doing fine not requiring any oxygen due to the COVID-19.  At this point we will continue supportive care.  She already have IV 1: Antibody infusion.  I am not starting Covid specific medication for now defer to rounding team if patient requires it or deteriorates.  #3 morbid obesity: Dietary counseling.  #4 sinus tachycardia: Secondary to COVID-19 infection and pulmonary embolism.  Continue monitor   DVT prophylaxis: Lovenox therapeutic Code Status: Full code Family Communication: No family at bedside Disposition Plan: Home Consults called: None Admission status: Inpatient  Severity of Illness: The appropriate patient status for this patient is INPATIENT. Inpatient status is judged to be reasonable and necessary in order to provide the required intensity of service to ensure the patient's safety. The patient's presenting symptoms, physical exam findings, and initial radiographic and laboratory data in the context of their chronic comorbidities is felt to place them at high risk for further clinical deterioration. Furthermore, it is not anticipated that the patient will be medically stable for discharge from the hospital within 2  midnights of admission. The following factors support the patient status of inpatient.   " The patient's presenting symptoms include chest pain nausea with vomiting. " The worrisome physical exam findings include sinus tachycardia. " The initial radiographic and laboratory data are worrisome because of bilateral pulmonary emboli. " The chronic co-morbidities include none.   * I certify that at the point of admission it is my clinical judgment that the patient will require inpatient hospital care spanning beyond 2 midnights from the point of admission due to high intensity of service, high risk for further deterioration and high frequency of surveillance required.Lonia Blood MD [facility] Pager [phone]  If 7PM-7AM, please contact night-coverage [email or web address] Password [email or web address]  04/02/2020, 11:04 PM

## 2020-04-02 NOTE — ED Provider Notes (Signed)
[facility] EMERGENCY DEPARTMENT Provider Note   CSN: [id number] Arrival date & time: 04/02/20  1105     History Chief Complaint  Patient presents with  . Chest Pain  . Back Pain    Faith [name] is a 29 y.o. female.  HPI  Patient presents 10 days after being diagnosed with coronavirus, now with concern for chest pain, right flank pain, nausea, vomiting. She did not receive her vaccines. She was diagnosed 10 days ago, received antibody infusions 2 days later.  She notes that she was recovering generally well until 2 days ago.  About that time she noticed new sternal chest pressure, right flank pain.  No new dyspnea, no hematuria. There is ongoing nausea, and she last vomited earlier today. No relief with OTC medications. No other abdominal pain, no diarrhea. Prior to coronavirus infection she was well.   Past Medical History:  Diagnosis Date  . Chlamydia   . Hypertension   . Infection    UTI  . Morbid obesity (HCC)   . Urinary tract infection   . Vaginal Pap smear, abnormal    f/u was ok    Patient Active Problem List   Diagnosis Date Noted  . Morbid obesity (HCC)   . Abnormal Pap smear of vagina 01/31/2016    Past Surgical History:  Procedure Laterality Date  . CESAREAN SECTION    . COLPOSCOPY    . DILATION AND EVACUATION N/A 10/04/2012   Procedure: DILATATION AND EVACUATION;  Surgeon: Lesly Dukes, MD;  Location: [facility];  Service: Gynecology;  Laterality: N/A;  . LEEP  11/2016     OB History    Gravida  4   Para  3   Term  3   Preterm  0   AB  1   Living  3     SAB  0   TAB  1   Ectopic  0   Multiple  0   Live Births  3           Family History  Problem Relation Age of Onset  . Diabetes Sister   . Hypertension Paternal Grandmother   . Cancer Paternal Grandfather   . Anesthesia problems Neg Hx   . Hearing loss Neg Hx     Social History   Tobacco Use  . Smoking status: Former Smoker     Packs/day: 0.25    Quit date: 04/05/2015    Years since quitting: 4.9  . Smokeless tobacco: Never Used  Vaping Use  . Vaping Use: Never used  Substance Use Topics  . Alcohol use: Not Currently  . Drug use: Never    Home Medications Prior to Admission medications   Medication Sig Start Date End Date Taking? Authorizing Provider  acetaminophen (TYLENOL) 500 MG tablet Take 500 mg by mouth every 6 (six) hours as needed for mild pain.    Yes [provider]  amoxicillin (AMOXIL) 875 MG tablet Take 1 tablet (875 mg total) by mouth 2 (two) times daily. Patient not taking: Reported on 04/02/2020 05/02/18   Elvina Sidle, MD  ibuprofen (ADVIL) 600 MG tablet Take 1 tablet (600 mg total) by mouth every 6 (six) hours as needed. Patient not taking: Reported on 04/02/2020 01/15/19   Mickie Bail, NP  Pseudoeph-Doxylamine-DM-APAP (NYQUIL PO) Take by mouth. Patient not taking: Reported on 04/02/2020    [provider]    Allergies    Patient has no known allergies.  Review of  Systems   Review of Systems  Constitutional:       Per HPI, otherwise negative  HENT:       Per HPI, otherwise negative  Respiratory:       Per HPI, otherwise negative  Cardiovascular:       Per HPI, otherwise negative  Gastrointestinal: Negative for vomiting.  Endocrine:       Negative aside from HPI  Genitourinary:       Neg aside from HPI   Musculoskeletal:       Per HPI, otherwise negative  Skin: Negative.   Neurological: Negative for syncope.    Physical Exam Updated Vital Signs BP 133/90   Pulse 96   Temp 98.8 F (37.1 C) (Oral)   Resp (!) 27   Ht 5\' 1"  (1.549 m)   Wt 104.3 kg   LMP 03/26/2020   SpO2 98%   BMI 43.46 kg/m   Physical Exam Vitals and nursing note reviewed.  Constitutional:      Appearance: She is obese. She is ill-appearing.  HENT:     Head: Normocephalic and atraumatic.  Eyes:     Conjunctiva/sclera: Conjunctivae normal.  Cardiovascular:     Rate and  Rhythm: Normal rate and regular rhythm.  Pulmonary:     Effort: Pulmonary effort is normal. No respiratory distress.     Breath sounds: No stridor. Decreased breath sounds present. No wheezing.  Abdominal:     General: There is no distension.     Palpations: Abdomen is soft.     Comments: R flank pain - described, no guarding  Skin:    General: Skin is warm and dry.  Neurological:     Mental Status: She is alert and oriented to person, place, and time.     Cranial Nerves: No cranial nerve deficit.      ED Results / Procedures / Treatments   Labs (all labs ordered are listed, but only abnormal results are displayed) Labs Reviewed  BASIC METABOLIC PANEL - Abnormal; Notable for the following components:      Result Value   CO2 21 (*)    Glucose, Bld 125 (*)    All other components within normal limits  CBC - Abnormal; Notable for the following components:   WBC 19.9 (*)    Hemoglobin 11.2 (*)    MCH 25.5 (*)    Platelets 402 (*)    All other components within normal limits  D-DIMER, QUANTITATIVE (NOT AT [facility]) - Abnormal; Notable for the following components:   D-Dimer, Quant 3.39 (*)    All other components within normal limits  URINALYSIS, ROUTINE W REFLEX MICROSCOPIC  I-STAT BETA HCG BLOOD, ED ([facility], [facility], [facility] ONLY)  TROPONIN I (HIGH SENSITIVITY)  TROPONIN I (HIGH SENSITIVITY)    EKG EKG Interpretation  Date/Time:  Monday April 02 2020 11:09:49 EST Ventricular Rate:  99 PR Interval:  138 QRS Duration: 90 QT Interval:  350 QTC Calculation: 449 R Axis:   77 Text Interpretation: Normal sinus rhythm Normal ECG Confirmed by 10-28-2002 [phone]) on 04/02/2020 6:56:54 PM   Radiology DG Chest 1 View  Result Date: 04/02/2020 CLINICAL DATA:  Shortness of breath, cough and low back pain. The patient tested positive for COVID-19 10 days ago. EXAM: CHEST  1 VIEW COMPARISON:  PA and lateral chest 03/15/2018. Single-view of the chest 03/23/2020. FINDINGS: Lung volumes are  low. Streaky bibasilar airspace opacities are greater on the left and unchanged. No pneumothorax or pleural effusion. Heart size is  normal. No acute or focal bony abnormality. IMPRESSION: Left greater than right streaky opacities are unchanged since the most recent examination and could be due to pneumonia or atelectasis. Electronically Signed   By: Drusilla Kanner M.D.   On: 04/02/2020 11:50   CT Angio Chest PE W and/or Wo Contrast  Result Date: 04/02/2020 CLINICAL DATA:  Chest and back pain since yesterday, COVID-19 positive 10 days ago EXAM: CT ANGIOGRAPHY CHEST WITH CONTRAST TECHNIQUE: Multidetector CT imaging of the chest was performed using the standard protocol during bolus administration of intravenous contrast. Multiplanar CT image reconstructions and MIPs were obtained to evaluate the vascular anatomy. CONTRAST:  79mL OMNIPAQUE IOHEXOL 350 MG/ML SOLN COMPARISON:  04/02/2020 FINDINGS: Cardiovascular: This is a technically adequate evaluation of the pulmonary vasculature. There are bilateral segmental pulmonary emboli, greatest in the lower lobes. There is moderate clot burden, but no evidence of right heart strain. No pericardial effusion.  Thoracic aorta is unremarkable. Mediastinum/Nodes: No enlarged mediastinal, hilar, or axillary lymph nodes. Thyroid gland, trachea, and esophagus demonstrate no significant findings. Lungs/Pleura: Patchy bilateral perihilar ground-glass airspace disease consistent with multifocal pneumonia and given history of COVID-19. Areas of denser consolidation within the dependent lower lobes compatible with atelectasis or infarct. Trace right pleural effusion. No pneumothorax. Central airways are patent. Upper Abdomen: No acute abnormality. Musculoskeletal: No acute or destructive bony lesions. Reconstructed images demonstrate no additional findings. Review of the MIP images confirms the above findings. IMPRESSION: 1. Bilateral segmental pulmonary emboli, with no evidence of  right heart strain. 2. Patchy bilateral perihilar ground-glass airspace disease consistent with multifocal pneumonia, compatible with history of COVID 19. 3. Trace right pleural effusion. 4. Bibasilar areas of consolidation consistent with atelectasis or developing pulmonary infarct. Critical Value/emergent results were called by telephone at the time of interpretation on 04/02/2020 at 9:09 pm to provider Gerhard Munch , who verbally acknowledged these results. Electronically Signed   By: Sharlet Salina M.D.   On: 04/02/2020 21:10    Procedures Procedures (including critical care time)  Medications Ordered in ED Medications  ibuprofen (ADVIL) tablet 400 mg (400 mg Oral Given 04/02/20 1637)  sodium chloride 0.9 % bolus 1,000 mL (0 mLs Intravenous Stopped 04/02/20 2125)  ketorolac (TORADOL) 30 MG/ML injection 15 mg (15 mg Intravenous Given 04/02/20 1851)  ondansetron (ZOFRAN) injection 4 mg (4 mg Intravenous Given 04/02/20 1853)  iohexol (OMNIPAQUE) 350 MG/ML injection 75 mL (75 mLs Intravenous Contrast Given 04/02/20 2035)    ED Course  I have reviewed the triage vital signs and the nursing notes.  Pertinent labs & imaging results that were available during my care of the patient were reviewed by me and considered in my medical decision making (see chart for details).    MDM Rules/Calculators/A&P                          9:26 PM Have discussed the patient's CT results with our radiologist. Patient on a bilateral pulmonary embolism.  Now on repeat exam the patient is awake and alert. She continues to complain of chest pain, but requires no additional oxygen. I discussed her case with her pharmacist to initiate anticoagulation therapy. Patient will require admission for her Covid infection, bilateral pulmonary embolism, ongoing anticoagulation.  Final Clinical Impression(s) / ED Diagnoses Final diagnoses:  COVID  Pulmonary embolism associated with COVID-19 (HCC)   MDM Number of  Diagnoses or Management Options COVID: established, worsening Pulmonary embolism associated with COVID-19 [facility] Treatment): new, needed workup  Amount and/or Complexity of Data Reviewed Clinical lab tests: reviewed Tests in the radiology section of CPT: reviewed Tests in the medicine section of CPT: reviewed Discussion of test results with the performing providers: yes Decide to obtain previous medical records or to obtain history from someone other than the patient: yes Obtain history from someone other than the patient: yes Review and summarize past medical records: yes Independent visualization of images, tracings, or specimens: yes  Risk of Complications, Morbidity, and/or Mortality Presenting problems: high Diagnostic procedures: high Management options: high  Critical Care Total time providing critical care: 30-74 minutes (35)  Patient Progress Patient progress: stable    Gerhard MunchLockwood, Ardena Gangl, MD 04/02/20 2144

## 2020-04-02 NOTE — ED Notes (Signed)
Lovenox dose delayed r/t waiting on dose from pharmacy.

## 2020-04-03 ENCOUNTER — Inpatient Hospital Stay (HOSPITAL_COMMUNITY): Payer: 59

## 2020-04-03 ENCOUNTER — Encounter (HOSPITAL_COMMUNITY): Payer: Self-pay | Admitting: Internal Medicine

## 2020-04-03 DIAGNOSIS — R791 Abnormal coagulation profile: Secondary | ICD-10-CM

## 2020-04-03 LAB — CBC WITH DIFFERENTIAL/PLATELET
Abs Immature Granulocytes: 0.21 10*3/uL — ABNORMAL HIGH (ref 0.00–0.07)
Basophils Absolute: 0 10*3/uL (ref 0.0–0.1)
Basophils Relative: 0 %
Eosinophils Absolute: 0 10*3/uL (ref 0.0–0.5)
Eosinophils Relative: 0 %
HCT: 36.1 % (ref 36.0–46.0)
Hemoglobin: 10.9 g/dL — ABNORMAL LOW (ref 12.0–15.0)
Immature Granulocytes: 1 %
Lymphocytes Relative: 11 %
Lymphs Abs: 1.9 10*3/uL (ref 0.7–4.0)
MCH: 25.5 pg — ABNORMAL LOW (ref 26.0–34.0)
MCHC: 30.2 g/dL (ref 30.0–36.0)
MCV: 84.3 fL (ref 80.0–100.0)
Monocytes Absolute: 1.3 10*3/uL — ABNORMAL HIGH (ref 0.1–1.0)
Monocytes Relative: 8 %
Neutro Abs: 13 10*3/uL — ABNORMAL HIGH (ref 1.7–7.7)
Neutrophils Relative %: 80 %
Platelets: 356 10*3/uL (ref 150–400)
RBC: 4.28 MIL/uL (ref 3.87–5.11)
RDW: 14.3 % (ref 11.5–15.5)
WBC: 16.5 10*3/uL — ABNORMAL HIGH (ref 4.0–10.5)
nRBC: 0 % (ref 0.0–0.2)

## 2020-04-03 LAB — COMPREHENSIVE METABOLIC PANEL
ALT: 20 U/L (ref 0–44)
AST: 14 U/L — ABNORMAL LOW (ref 15–41)
Albumin: 2.9 g/dL — ABNORMAL LOW (ref 3.5–5.0)
Alkaline Phosphatase: 50 U/L (ref 38–126)
Anion gap: 13 (ref 5–15)
BUN: 7 mg/dL (ref 6–20)
CO2: 23 mmol/L (ref 22–32)
Calcium: 8.9 mg/dL (ref 8.9–10.3)
Chloride: 102 mmol/L (ref 98–111)
Creatinine, Ser: 0.85 mg/dL (ref 0.44–1.00)
GFR, Estimated: >60 mL/min (ref >60)
Glucose, Bld: 124 mg/dL — ABNORMAL HIGH (ref 70–99)
Potassium: 3.9 mmol/L (ref 3.5–5.1)
Sodium: 138 mmol/L (ref 135–145)
Total Bilirubin: 0.8 mg/dL (ref 0.3–1.2)
Total Protein: 7 g/dL (ref 6.5–8.1)

## 2020-04-03 LAB — C-REACTIVE PROTEIN: CRP: 25.5 mg/dL — ABNORMAL HIGH (ref <1.0)

## 2020-04-03 LAB — PROCALCITONIN: Procalcitonin: 0.23 ng/mL

## 2020-04-03 LAB — D-DIMER, QUANTITATIVE: D-Dimer, Quant: 3.38 ug/mL-FEU — ABNORMAL HIGH (ref 0.00–0.50)

## 2020-04-03 LAB — HIV ANTIBODY (ROUTINE TESTING W REFLEX): HIV Screen 4th Generation wRfx: NONREACTIVE

## 2020-04-03 MED ORDER — MORPHINE SULFATE (PF) 2 MG/ML IV SOLN
2.0000 mg | INTRAVENOUS | Status: DC | PRN
  Administered 2020-04-03 – 2020-04-05 (×7): 2 mg via INTRAVENOUS
  Filled 2020-04-03 (×7): qty 1

## 2020-04-03 MED ORDER — KETOROLAC TROMETHAMINE 30 MG/ML IJ SOLN
30.0000 mg | Freq: Once | INTRAMUSCULAR | Status: AC
  Administered 2020-04-03: 30 mg via INTRAVENOUS
  Filled 2020-04-03: qty 1

## 2020-04-03 MED ORDER — WHITE PETROLATUM EX OINT
TOPICAL_OINTMENT | CUTANEOUS | Status: AC
  Filled 2020-04-03: qty 28.35

## 2020-04-03 MED ORDER — DEXAMETHASONE SODIUM PHOSPHATE 10 MG/ML IJ SOLN
6.0000 mg | INTRAMUSCULAR | Status: DC
  Administered 2020-04-03 – 2020-04-04 (×2): 6 mg via INTRAVENOUS
  Filled 2020-04-03 (×2): qty 1

## 2020-04-03 MED ORDER — LACTATED RINGERS IV SOLN: INTRAVENOUS | Status: AC

## 2020-04-03 MED ORDER — INFLUENZA VAC SPLIT QUAD 0.5 ML IM SUSY
0.5000 mL | PREFILLED_SYRINGE | INTRAMUSCULAR | Status: DC
  Filled 2020-04-03: qty 0.5

## 2020-04-03 MED ORDER — LACTATED RINGERS IV SOLN
INTRAVENOUS | Status: DC
Start: 2020-04-03 — End: 2020-04-03

## 2020-04-03 MED ORDER — LEVOFLOXACIN 750 MG PO TABS
750.0000 mg | ORAL_TABLET | Freq: Every day | ORAL | Status: DC
  Administered 2020-04-03 – 2020-04-04 (×2): 750 mg via ORAL
  Filled 2020-04-03 (×2): qty 1

## 2020-04-03 NOTE — Progress Notes (Signed)
Faith [name] [id number]  Code Status: FULL  Faith [name] is a 30 y.o. female patient admitted from ED awake, alert - oriented X 4 - no acute distress noted. VSS - Blood pressure 121/81, pulse (!) 101, temperature 98.1 F (36.7 C), temperature source Oral, resp. rate (!) 24, height 5\' 1"  (1.549 m), weight 104.3 kg, last menstrual period 03/26/2020, SpO2 92 %. on 2L. Has SOB with exertion and has had ongoing chest pain, awaiting effectiveness from pain medication given in ED.  Fall assessment complete, with patient able to verbalize understanding of risk associated with falls, and verbalized understanding to call nsg before up out of bed. Call light within reach, patient able to voice, and demonstrate understanding. Skin, clean-dry- intact without evidence of bruising, or skin tears.  No evidence of skin break down noted on exam.  ?  Will cont to eval and treat per MD orders.  03/28/2020, RN

## 2020-04-03 NOTE — ED Notes (Signed)
Pharmacy called regarding missing dose; to tube

## 2020-04-03 NOTE — ED Notes (Signed)
Rolling call for patient transport upstairs made. Patient on stretcher with all belongings in bag. She is alert, oriented, and pain is much improved. She is smiling and reports pain is now 2/10. No more grunting noted. Patient with surgical mask in place. VSS, tachypnea still present. Understanding of plan and reason for admission.

## 2020-04-03 NOTE — Progress Notes (Signed)
   04/03/20 1112  Assess: MEWS Score  Temp 98.5 F (36.9 C)  BP 115/65  Pulse Rate 100  ECG Heart Rate 100  Resp (!) 27  SpO2 94 %  O2 Device Nasal Cannula  O2 Flow Rate (L/min) 2 L/min  Assess: MEWS Score  MEWS Temp 0  MEWS Systolic 0  MEWS Pulse 0  MEWS RR 2  MEWS LOC 0  MEWS Score 2  MEWS Score Color Yellow  Assess: if the MEWS score is Yellow or Red  Were vital signs taken at a resting state? Yes  Focused Assessment No change from prior assessment  Early Detection of Sepsis Score *See Row Information* Low  MEWS guidelines implemented *See Row Information* Yes  Treat  MEWS Interventions Escalated (See documentation below)  Take Vital Signs  Increase Vital Sign Frequency  Yellow: Q 2hr X 2 then Q 4hr X 2, if remains yellow, continue Q 4hrs  Escalate  MEWS: Escalate Yellow: discuss with charge nurse/RN and consider discussing with provider and RRT  Notify: Charge Nurse/RN  Name of Charge Nurse/RN Notified Erin, RN  Date Charge Nurse/RN Notified 04/03/20  Time Charge Nurse/RN Notified 1130

## 2020-04-03 NOTE — Progress Notes (Signed)
Lower extremity veous has been completed.   Preliminary results in CV Proc.   Blanch Media 04/03/2020 3:15 PM

## 2020-04-03 NOTE — Progress Notes (Signed)
PROGRESS NOTE                                                                                                                                                                                                             Patient Demographics:    Faith [name], is a 29 y.o. female, DOB - July 03, 1990, CHY:[id number]  Outpatient Primary MD for the patient is Patient, No Pcp Per    LOS - 1  Admit date - 04/02/2020    Chief Complaint  Patient presents with  . Chest Pain  . Back Pain       Brief Narrative (HPI from H&P) Faith [name] is a 29 y.o. female with medical history significant of essential hypertension not on any medication, morbid obesity, who was diagnosed with COVID-19 pneumonia on 03/23/20 -came to the hospital with chest pain.  In the ER was diagnosed with subsegmental PE along with COVID-19 pneumonia on CT chest and admitted to the hospital.   Subjective:    Faith [name] today has, No headache, No chest pain, No abdominal pain - No Nausea, No new weakness tingling or numbness, mild cough but currently no shortness of breath.   Assessment  & Plan :     1. Acute bilateral subsegmental PE with acute COVID-19 pneumonia - she is unfortunately not vaccinated, initial COVID-19 pneumonia diagnosis on 03/23/2020.  PE likely due to inflammation from COVID-19, on full dose Lovenox, will require total of 6 to 9 months of anticoagulation, likely can be switched to Eliquis in a day or 2.  Currently no hypoxia but CRP is creeping up hence will place her on moderate dose steroids and monitor.  Check lower extremity venous duplex.    SpO2: 97 %  Recent Labs  Lab 04/02/20 1119 04/02/20 1848 04/03/20 0245  WBC 19.9*  --  16.5*  HGB 11.2*  --  10.9*  HCT 36.8  --  36.1  PLT 402*  --  356  CRP  --   --  25.5*  DDIMER  --  3.39* 3.38*  PROCALCITON  --   --  0.23  AST  --   --  14*  ALT  --   --  20   ALKPHOS  --   --  50  BILITOT  --   --  0.8  ALBUMIN  --   --  2.9*    2.  Evidence of consolidation, leukocytosis and borderline procalcitonin - suggest that she could have some bacterial pneumonia as well, challenge with Levaquin 5 doses   3.  Morbid obesity.  BMI 43.  Follow with PCP for weight loss.  4.  Clinical dehydration.  Hydrate with IV fluids  I-STAT pregnancy test negative    Condition - Fair  Family Communication  : None.  Code Status : Full  Consults  : None  Procedures  :    CT chest.  Bilateral subsegmental PE and consolidation, bilateral groundglass opacities  PUD Prophylaxis : none  Disposition Plan  :    Status is: Inpatient  Remains inpatient appropriate because:IV treatments appropriate due to intensity of illness or inability to take PO   Dispo: The patient is from: Home              Anticipated d/c is to: Home              Anticipated d/c date is: 3 days              Patient currently is not medically stable to d/c.      DVT Prophylaxis  :  Lovenox   Lab Results  Component Value Date   PLT 356 04/03/2020    Diet :  Diet Order            Diet regular Room service appropriate? Yes; Fluid consistency: Thin  Diet effective now                  Inpatient Medications  Scheduled Meds: . dexamethasone (DECADRON) injection  6 mg Intravenous Q24H  . enoxaparin (LOVENOX) injection  1 mg/kg Subcutaneous Q12H  . Ipratropium-Albuterol  1 puff Inhalation Q6H  . levofloxacin  750 mg Oral Daily   Continuous Infusions: . lactated ringers     PRN Meds:.acetaminophen, chlorpheniramine-HYDROcodone, guaiFENesin-dextromethorphan, [DISCONTINUED] ondansetron **OR** ondansetron (ZOFRAN) IV, traMADol  Antibiotics  :    Anti-infectives (From admission, onward)   Start     Dose/Rate Route Frequency Ordered Stop   04/03/20 1000  levofloxacin (LEVAQUIN) tablet 750 mg        750 mg Oral Daily 04/03/20 0906 04/08/20 0959       Time Spent in  minutes  30   Susa Raring M.D on 04/03/2020 at 9:07 AM  To page go to [email or web address] - password [facility]  [facility] -  Office  [phone]    See all Orders from today for further details    Objective:   Vitals:   04/03/20 0600 04/03/20 0615 04/03/20 0700 04/03/20 0800  BP: 125/68  110/70 109/69  Pulse:  (!) 110 (!) 101 95  Resp: (!) 36 20 (!) 34 (!) 29  Temp:      TempSrc:      SpO2:  96% 96% 97%  Weight:      Height:        Wt Readings from Last 3 Encounters:  04/02/20 104.3 kg  02/15/18 98 kg  05/25/17 106.1 kg    No intake or output data in the 24 hours ending 04/03/20 0907   Physical Exam  Awake Alert, No new F.N deficits, Normal affect [place].AT,PERRAL Supple Neck,No JVD, No cervical lymphadenopathy appriciated.  Symmetrical Chest wall movement, Good air movement bilaterally, CTAB RRR,No Gallops,Rubs or new Murmurs, No Parasternal Heave +ve B.Sounds, Abd Soft, No tenderness, No organomegaly appriciated, No rebound - guarding or rigidity. No Cyanosis, Clubbing or edema, No new  Rash or bruise      Data Review:    CBC Recent Labs  Lab 04/02/20 1119 04/03/20 0245  WBC 19.9* 16.5*  HGB 11.2* 10.9*  HCT 36.8 36.1  PLT 402* 356  MCV 83.8 84.3  MCH 25.5* 25.5*  MCHC 30.4 30.2  RDW 14.2 14.3  LYMPHSABS  --  1.9  MONOABS  --  1.3*  EOSABS  --  0.0  BASOSABS  --  0.0    Recent Labs  Lab 04/02/20 1119 04/02/20 1848 04/03/20 0245  NA 137  --  138  K 4.2  --  3.9  CL 103  --  102  CO2 21*  --  23  GLUCOSE 125*  --  124*  BUN 8  --  7  CREATININE 0.82  --  0.85  CALCIUM 9.3  --  8.9  AST  --   --  14*  ALT  --   --  20  ALKPHOS  --   --  50  BILITOT  --   --  0.8  ALBUMIN  --   --  2.9*  CRP  --   --  25.5*  DDIMER  --  3.39* 3.38*  PROCALCITON  --   --  0.23    ------------------------------------------------------------------------------------------------------------------ No results for input(s): CHOL, HDL, LDLCALC, TRIG,  CHOLHDL, LDLDIRECT in the last 72 hours.  No results found for: HGBA1C ------------------------------------------------------------------------------------------------------------------ No results for input(s): TSH, T4TOTAL, T3FREE, THYROIDAB in the last 72 hours.  Invalid input(s): FREET3  Cardiac Enzymes No results for input(s): CKMB, TROPONINI, MYOGLOBIN in the last 168 hours.  Invalid input(s): CK ------------------------------------------------------------------------------------------------------------------ No results found for: BNP  Micro Results No results found for this or any previous visit (from the past 240 hour(s)).  Radiology Reports DG Chest 1 View  Result Date: 04/02/2020 CLINICAL DATA:  Shortness of breath, cough and low back pain. The patient tested positive for COVID-19 10 days ago. EXAM: CHEST  1 VIEW COMPARISON:  PA and lateral chest 03/15/2018. Single-view of the chest 03/23/2020. FINDINGS: Lung volumes are low. Streaky bibasilar airspace opacities are greater on the left and unchanged. No pneumothorax or pleural effusion. Heart size is normal. No acute or focal bony abnormality. IMPRESSION: Left greater than right streaky opacities are unchanged since the most recent examination and could be due to pneumonia or atelectasis. Electronically Signed   By: Drusilla Kannerhomas  Dalessio M.D.   On: 04/02/2020 11:50   CT Angio Chest PE W and/or Wo Contrast  Result Date: 04/02/2020 CLINICAL DATA:  Chest and back pain since yesterday, COVID-19 positive 10 days ago EXAM: CT ANGIOGRAPHY CHEST WITH CONTRAST TECHNIQUE: Multidetector CT imaging of the chest was performed using the standard protocol during bolus administration of intravenous contrast. Multiplanar CT image reconstructions and MIPs were obtained to evaluate the vascular anatomy. CONTRAST:  75mL OMNIPAQUE IOHEXOL 350 MG/ML SOLN COMPARISON:  04/02/2020 FINDINGS: Cardiovascular: This is a technically adequate evaluation of the  pulmonary vasculature. There are bilateral segmental pulmonary emboli, greatest in the lower lobes. There is moderate clot burden, but no evidence of right heart strain. No pericardial effusion.  Thoracic aorta is unremarkable. Mediastinum/Nodes: No enlarged mediastinal, hilar, or axillary lymph nodes. Thyroid gland, trachea, and esophagus demonstrate no significant findings. Lungs/Pleura: Patchy bilateral perihilar ground-glass airspace disease consistent with multifocal pneumonia and given history of COVID-19. Areas of denser consolidation within the dependent lower lobes compatible with atelectasis or infarct. Trace right pleural effusion. No pneumothorax. Central airways are patent. Upper Abdomen: No  acute abnormality. Musculoskeletal: No acute or destructive bony lesions. Reconstructed images demonstrate no additional findings. Review of the MIP images confirms the above findings. IMPRESSION: 1. Bilateral segmental pulmonary emboli, with no evidence of right heart strain. 2. Patchy bilateral perihilar ground-glass airspace disease consistent with multifocal pneumonia, compatible with history of COVID 19. 3. Trace right pleural effusion. 4. Bibasilar areas of consolidation consistent with atelectasis or developing pulmonary infarct. Critical Value/emergent results were called by telephone at the time of interpretation on 04/02/2020 at 9:09 pm to provider Gerhard Munch , who verbally acknowledged these results. Electronically Signed   By: Sharlet Salina M.D.   On: 04/02/2020 21:10   DG Chest Portable 1 View  Result Date: 03/23/2020 CLINICAL DATA:  COVID EXAM: PORTABLE CHEST 1 VIEW COMPARISON:  March 15, 2018 FINDINGS: The heart size and mediastinal contours are within normal limits. Hazy airspace opacity seen at the right lung base. The visualized skeletal structures are unremarkable. IMPRESSION: Hazy airspace opacity at the right lung base which likely due to infectious etiology. Electronically Signed    By: Jonna Clark M.D.   On: 03/23/2020 20:08

## 2020-04-03 NOTE — ED Notes (Signed)
Messaged provider re: This patient with tachypnea and grunting respirations. She is having pain of 10/10 in the upper right abd/lower right chest. She got her ultram at [facility] this morning and it's not due yet. Any orders? Patient on 3 l/min [place] at this time.

## 2020-04-04 DIAGNOSIS — I2699 Other pulmonary embolism without acute cor pulmonale: Secondary | ICD-10-CM

## 2020-04-04 LAB — COMPREHENSIVE METABOLIC PANEL
ALT: 30 U/L (ref 0–44)
AST: 24 U/L (ref 15–41)
Albumin: 2.8 g/dL — ABNORMAL LOW (ref 3.5–5.0)
Alkaline Phosphatase: 58 U/L (ref 38–126)
Anion gap: 11 (ref 5–15)
BUN: 9 mg/dL (ref 6–20)
CO2: 23 mmol/L (ref 22–32)
Calcium: 9.2 mg/dL (ref 8.9–10.3)
Chloride: 104 mmol/L (ref 98–111)
Creatinine, Ser: 0.87 mg/dL (ref 0.44–1.00)
GFR, Estimated: >60 mL/min (ref >60)
Glucose, Bld: 125 mg/dL — ABNORMAL HIGH (ref 70–99)
Potassium: 4.6 mmol/L (ref 3.5–5.1)
Sodium: 138 mmol/L (ref 135–145)
Total Bilirubin: 0.4 mg/dL (ref 0.3–1.2)
Total Protein: 7.7 g/dL (ref 6.5–8.1)

## 2020-04-04 LAB — CBC WITH DIFFERENTIAL/PLATELET
Abs Immature Granulocytes: 0.18 10*3/uL — ABNORMAL HIGH (ref 0.00–0.07)
Basophils Absolute: 0 10*3/uL (ref 0.0–0.1)
Basophils Relative: 0 %
Eosinophils Absolute: 0 10*3/uL (ref 0.0–0.5)
Eosinophils Relative: 0 %
HCT: 35.2 % — ABNORMAL LOW (ref 36.0–46.0)
Hemoglobin: 10.6 g/dL — ABNORMAL LOW (ref 12.0–15.0)
Immature Granulocytes: 1 %
Lymphocytes Relative: 7 %
Lymphs Abs: 1.3 10*3/uL (ref 0.7–4.0)
MCH: 25.3 pg — ABNORMAL LOW (ref 26.0–34.0)
MCHC: 30.1 g/dL (ref 30.0–36.0)
MCV: 84 fL (ref 80.0–100.0)
Monocytes Absolute: 1.4 10*3/uL — ABNORMAL HIGH (ref 0.1–1.0)
Monocytes Relative: 7 %
Neutro Abs: 16 10*3/uL — ABNORMAL HIGH (ref 1.7–7.7)
Neutrophils Relative %: 85 %
Platelets: 346 10*3/uL (ref 150–400)
RBC: 4.19 MIL/uL (ref 3.87–5.11)
RDW: 13.9 % (ref 11.5–15.5)
WBC: 18.9 10*3/uL — ABNORMAL HIGH (ref 4.0–10.5)
nRBC: 0 % (ref 0.0–0.2)

## 2020-04-04 LAB — PROCALCITONIN: Procalcitonin: 0.11 ng/mL

## 2020-04-04 LAB — D-DIMER, QUANTITATIVE: D-Dimer, Quant: 3.7 ug/mL-FEU — ABNORMAL HIGH (ref 0.00–0.50)

## 2020-04-04 LAB — MAGNESIUM: Magnesium: 2.5 mg/dL — ABNORMAL HIGH (ref 1.7–2.4)

## 2020-04-04 LAB — C-REACTIVE PROTEIN: CRP: 26.8 mg/dL — ABNORMAL HIGH (ref <1.0)

## 2020-04-04 MED ORDER — SODIUM CHLORIDE 0.9 % IV SOLN
200.0000 mg | Freq: Once | INTRAVENOUS | Status: AC
  Administered 2020-04-04: 200 mg via INTRAVENOUS
  Filled 2020-04-04: qty 40

## 2020-04-04 MED ORDER — ADULT MULTIVITAMIN W/MINERALS CH
1.0000 | ORAL_TABLET | Freq: Every day | ORAL | Status: DC
  Administered 2020-04-04 – 2020-04-11 (×8): 1 via ORAL
  Filled 2020-04-04 (×8): qty 1

## 2020-04-04 MED ORDER — SODIUM CHLORIDE 0.9 % IV SOLN
1.0000 g | INTRAVENOUS | Status: AC
  Administered 2020-04-04 – 2020-04-07 (×4): 1 g via INTRAVENOUS
  Filled 2020-04-04 (×4): qty 10

## 2020-04-04 MED ORDER — AZITHROMYCIN 500 MG PO TABS
500.0000 mg | ORAL_TABLET | Freq: Every day | ORAL | Status: AC
  Administered 2020-04-04 – 2020-04-07 (×4): 500 mg via ORAL
  Filled 2020-04-04 (×4): qty 1

## 2020-04-04 MED ORDER — METHYLPREDNISOLONE SODIUM SUCC 125 MG IJ SOLR
60.0000 mg | Freq: Three times a day (TID) | INTRAMUSCULAR | Status: DC
  Administered 2020-04-04 – 2020-04-07 (×8): 60 mg via INTRAVENOUS
  Filled 2020-04-04 (×8): qty 2

## 2020-04-04 MED ORDER — SODIUM CHLORIDE 0.9 % IV SOLN
100.0000 mg | Freq: Every day | INTRAVENOUS | Status: AC
  Administered 2020-04-05 – 2020-04-08 (×4): 100 mg via INTRAVENOUS
  Filled 2020-04-04 (×5): qty 20

## 2020-04-04 NOTE — Plan of Care (Signed)
VSS. Patient currently on 3L [place]. OOB to chair/BSC. SOB c/o chest pain, given pain medicaitons. Encourage incentive spirometer and flutter valve. Call bell within reach.  Problem: Education: Goal: Knowledge of General Education information will improve Description: Including pain rating scale, medication(s)/side effects and non-pharmacologic comfort measures Outcome: Progressing   Problem: Health Behavior/Discharge Planning: Goal: Ability to manage health-related needs will improve Outcome: Progressing   Problem: Clinical Measurements: Goal: Ability to maintain clinical measurements within normal limits will improve Outcome: Progressing Goal: Will remain free from infection Outcome: Progressing Goal: Diagnostic test results will improve Outcome: Progressing Goal: Respiratory complications will improve Outcome: Progressing Goal: Cardiovascular complication will be avoided Outcome: Progressing   Problem: Activity: Goal: Risk for activity intolerance will decrease Outcome: Progressing   Problem: Nutrition: Goal: Adequate nutrition will be maintained Outcome: Progressing   Problem: Coping: Goal: Level of anxiety will decrease Outcome: Progressing   Problem: Elimination: Goal: Will not experience complications related to bowel motility Outcome: Progressing Goal: Will not experience complications related to urinary retention Outcome: Progressing   Problem: Pain Managment: Goal: General experience of comfort will improve Outcome: Progressing   Problem: Safety: Goal: Ability to remain free from injury will improve Outcome: Progressing   Problem: Skin Integrity: Goal: Risk for impaired skin integrity will decrease Outcome: Progressing   Problem: Education: Goal: Knowledge of risk factors and measures for prevention of condition will improve Outcome: Progressing   Problem: Coping: Goal: Psychosocial and spiritual needs will be supported Outcome: Progressing    Problem: Respiratory: Goal: Will maintain a patent airway Outcome: Progressing Goal: Complications related to the disease process, condition or treatment will be avoided or minimized Outcome: Progressing

## 2020-04-04 NOTE — Progress Notes (Signed)
Initial Nutrition Assessment  DOCUMENTATION CODES:   Morbid obesity  INTERVENTION:   -Magic cup TID with meals, each supplement provides 290 kcal and 9 grams of protein -MVI with minerals daily  NUTRITION DIAGNOSIS:   Increased nutrient needs related to acute illness (COVID-19) as evidenced by estimated needs.  GOAL:   Patient will meet greater than or equal to 90% of their needs  MONITOR:   PO intake, Supplement acceptance, Labs, Weight trends, Skin, I & O's  REASON FOR ASSESSMENT:   Malnutrition Screening Tool    ASSESSMENT:   Faith [name] is a 29 y.o. female with medical history significant of essential hypertension not on any medication, morbid obesity, who was diagnosed with COVID-19 pneumonia about 10 days ago.  Pt admitted with bilateral pulmonary emboli and COVID-19 pneumonia.   Reviewed I/O's: +860 ml x 24 hours  Attempted to speak with pt via call to hospital room phone, however, no answer. RD unable to obtain further nutrition-related history at this time.   Pt with good appetite; noted meal completion 100%.   Reviewed wt hx; wt has been stable over the past year.   Pt with increased nutritional needs secondary to COVID-19. She would benefit from addition of oral nutrition supplements.   Medications reviewed and include decadron.   Labs reviewed: Mg: 2.5.   Diet Order:   Diet Order            Diet regular Room service appropriate? Yes; Fluid consistency: Thin  Diet effective now                 EDUCATION NEEDS:   Education needs have been addressed  Skin:  Skin Assessment: Reviewed RN Assessment  Last BM:  04/02/20  Height:   Ht Readings from Last 1 Encounters:  04/02/20 5\' 1"  (1.549 m)    Weight:   Wt Readings from Last 1 Encounters:  04/02/20 104.3 kg    Ideal Body Weight:  47.7 kg  BMI:  Body mass index is 43.46 kg/m.  Estimated Nutritional Needs:   Kcal:  1900-2100  Protein:  105-120 grams  Fluid:  > 1.9  L    04/04/20, RD, LDN, CDCES Registered Dietitian II Certified Diabetes Care and Education Specialist Please refer to [facility] for RD and/or RD on-call/weekend/after hours pager

## 2020-04-04 NOTE — Progress Notes (Signed)
PROGRESS NOTE                                                                                                                                                                                                             Patient Demographics:    Faith [name], is a 29 y.o. female, DOB - 1991/03/27, ZOX:[id number]  Outpatient Primary MD for the patient is Patient, No Pcp Per    LOS - 2  Admit date - 04/02/2020    Chief Complaint  Patient presents with  . Chest Pain  . Back Pain       Brief Narrative (HPI from H&P)  - Faith [name] is a 29 y.o. female with medical history significant of essential hypertension not on any medication, morbid obesity, who was diagnosed with COVID-19 pneumonia on 03/23/20 -came to the hospital with chest pain.  In the ER was diagnosed with subsegmental PE along with COVID-19 pneumonia on CT chest and admitted to the hospital.   Subjective:    Faith [name] today reports some cough, reports shortness of breath with activity, denies any fever or chills or chest pain.     Assessment  & Plan :     Acute hypoxic respiratory failure due to acute bilateral subsegmental PE with acute COVID-19 pneumonia  - she is unfortunately not vaccinated. - initial COVID-19 pneumonia diagnosis on 03/23/2020.  PE likely due to inflammation from COVID-19, on full dose Lovenox, will require total of 6 to 9 months of anticoagulation, likely can be switched to Eliquis in a day or 2.  -Is on 3 L nasal cannula this morning, will start on IV remdesivir, continue with IV steroids, will change to IV Solu-Medrol. -Venous Dopplers negative for DVT. -She was encouraged with incentive spirometry and flutter valve, out of bed to chair. -He is on antibiotics given elevated procalcitonin on admission, will treat for bacterial pneumonia as well, will change levofloxacin to azithromycin/Rocephin. -Discussed with the  patient again today, she was encouraged use incentive spirometry, flutter valve, out of bed to chair.   SpO2: 100 % O2 Flow Rate (L/min): 3 L/min  Recent Labs  Lab 04/02/20 1119 04/02/20 1848 04/03/20 0245 04/04/20 0055  WBC 19.9*  --  16.5* 18.9*  HGB 11.2*  --  10.9* 10.6*  HCT 36.8  --  36.1 35.2*  PLT 402*  --  356 346  CRP  --   --  25.5* 26.8*  DDIMER  --  3.39* 3.38* 3.70*  PROCALCITON  --   --  0.23 0.11  AST  --   --  14* 24  ALT  --   --  20 30  ALKPHOS  --   --  50 58  BILITOT  --   --  0.8 0.4  ALBUMIN  --   --  2.9* 2.8*     Morbid obesity.  BMI 43.   - Follow with PCP for weight loss.  Clinical dehydration.  -  Hydrate with IV fluids  I-STAT pregnancy test negative    Condition - Fair  Family Communication  : I have discussed with patient, all her questions were answered in details .  Code Status : Full  Consults  : None  Procedures  :    CT chest.  Bilateral subsegmental PE and consolidation, bilateral groundglass opacities  PUD Prophylaxis : none  Disposition Plan  :    Status is: Inpatient  Remains inpatient appropriate because:IV treatments appropriate due to intensity of illness or inability to take PO   Dispo: The patient is from: Home              Anticipated d/c is to: Home              Anticipated d/c date is: 3 days              Patient currently is not medically stable to d/c.      DVT Prophylaxis  :  Lovenox   Lab Results  Component Value Date   PLT 346 04/04/2020    Diet :  Diet Order            Diet regular Room service appropriate? Yes; Fluid consistency: Thin  Diet effective now                  Inpatient Medications  Scheduled Meds: . azithromycin  500 mg Oral Daily  . dexamethasone (DECADRON) injection  6 mg Intravenous Q24H  . enoxaparin (LOVENOX) injection  1 mg/kg Subcutaneous Q12H  . influenza vac split quadrivalent PF  0.5 mL Intramuscular Tomorrow-1000  . Ipratropium-Albuterol  1 puff  Inhalation Q6H  . multivitamin with minerals  1 tablet Oral Daily   Continuous Infusions: . cefTRIAXone (ROCEPHIN)  IV 1 g (04/04/20 1151)  . remdesivir 200 mg in sodium chloride 0.9% 250 mL IVPB     Followed by  . [START ON 04/05/2020] remdesivir 100 mg in NS 100 mL     PRN Meds:.acetaminophen, chlorpheniramine-HYDROcodone, guaiFENesin-dextromethorphan, morphine injection, [DISCONTINUED] ondansetron **OR** ondansetron (ZOFRAN) IV, traMADol  Antibiotics  :    Anti-infectives (From admission, onward)   Start     Dose/Rate Route Frequency Ordered Stop   04/05/20 1000  remdesivir 100 mg in sodium chloride 0.9 % 100 mL IVPB       "Followed by" Linked Group Details   100 mg 200 mL/hr over 30 Minutes Intravenous Daily 04/04/20 1034 04/09/20 0959   04/04/20 1130  remdesivir 200 mg in sodium chloride 0.9% 250 mL IVPB       "Followed by" Linked Group Details   200 mg 580 mL/hr over 30 Minutes Intravenous Once 04/04/20 1034     04/04/20 1130  cefTRIAXone (ROCEPHIN) 1 g in sodium chloride 0.9 % 100 mL IVPB        1 g 200 mL/hr over 30 Minutes Intravenous Every 24  hours 04/04/20 1034     04/04/20 1130  azithromycin (ZITHROMAX) tablet 500 mg        500 mg Oral Daily 04/04/20 1034     04/03/20 1000  levofloxacin (LEVAQUIN) tablet 750 mg  Status:  Discontinued        750 mg Oral Daily 04/03/20 0906 04/04/20 1035       Time Spent in minutes  30   Huey Bienenstock M.D on 04/04/2020 at 12:00 PM  To page go to [email or web address] - password [facility]  [facility] -  Office  [phone]    See all Orders from today for further details    Objective:   Vitals:   04/03/20 2317 04/04/20 0351 04/04/20 0758 04/04/20 1145  BP: 136/78 (!) 136/58 (!) 134/94 133/79  Pulse: 95 99 73 96  Resp: (!) 27 (!) 25 (!) 23 (!) 22  Temp: 98.4 F (36.9 C) 98.1 F (36.7 C) 97.8 F (36.6 C) 98.1 F (36.7 C)  TempSrc: Oral Oral Oral Oral  SpO2: 94% 100% 100% 100%  Weight:      Height:        Wt Readings  from Last 3 Encounters:  04/02/20 104.3 kg  02/15/18 98 kg  05/25/17 106.1 kg     Intake/Output Summary (Last 24 hours) at 04/04/2020 1200 Last data filed at 04/04/2020 0846 Gross per 24 hour  Intake 980 ml  Output --  Net 980 ml     Physical Exam  Awake Alert, Oriented X 3, No new F.N deficits, Normal affect Symmetrical Chest wall movement, Good air movement bilaterally, CTAB RRR,No Gallops,Rubs or new Murmurs, No Parasternal Heave +ve B.Sounds, Abd Soft, No tenderness, No rebound - guarding or rigidity. No Cyanosis, Clubbing or edema, No new Rash or bruise     Data Review:    CBC Recent Labs  Lab 04/02/20 1119 04/03/20 0245 04/04/20 0055  WBC 19.9* 16.5* 18.9*  HGB 11.2* 10.9* 10.6*  HCT 36.8 36.1 35.2*  PLT 402* 356 346  MCV 83.8 84.3 84.0  MCH 25.5* 25.5* 25.3*  MCHC 30.4 30.2 30.1  RDW 14.2 14.3 13.9  LYMPHSABS  --  1.9 1.3  MONOABS  --  1.3* 1.4*  EOSABS  --  0.0 0.0  BASOSABS  --  0.0 0.0    Recent Labs  Lab 04/02/20 1119 04/02/20 1848 04/03/20 0245 04/04/20 0055  NA 137  --  138 138  K 4.2  --  3.9 4.6  CL 103  --  102 104  CO2 21*  --  23 23  GLUCOSE 125*  --  124* 125*  BUN 8  --  7 9  CREATININE 0.82  --  0.85 0.87  CALCIUM 9.3  --  8.9 9.2  AST  --   --  14* 24  ALT  --   --  20 30  ALKPHOS  --   --  50 58  BILITOT  --   --  0.8 0.4  ALBUMIN  --   --  2.9* 2.8*  MG  --   --   --  2.5*  CRP  --   --  25.5* 26.8*  DDIMER  --  3.39* 3.38* 3.70*  PROCALCITON  --   --  0.23 0.11    ------------------------------------------------------------------------------------------------------------------ No results for input(s): CHOL, HDL, LDLCALC, TRIG, CHOLHDL, LDLDIRECT in the last 72 hours.  No results found for: HGBA1C ------------------------------------------------------------------------------------------------------------------ No results for input(s): TSH, T4TOTAL, T3FREE, THYROIDAB in the last 72 hours.  Invalid input(s):  FREET3  Cardiac Enzymes No results for input(s): CKMB, TROPONINI, MYOGLOBIN in the last 168 hours.  Invalid input(s): CK ------------------------------------------------------------------------------------------------------------------ No results found for: BNP  Micro Results No results found for this or any previous visit (from the past 240 hour(s)).  Radiology Reports DG Chest 1 View  Result Date: 04/02/2020 CLINICAL DATA:  Shortness of breath, cough and low back pain. The patient tested positive for COVID-19 10 days ago. EXAM: CHEST  1 VIEW COMPARISON:  PA and lateral chest 03/15/2018. Single-view of the chest 03/23/2020. FINDINGS: Lung volumes are low. Streaky bibasilar airspace opacities are greater on the left and unchanged. No pneumothorax or pleural effusion. Heart size is normal. No acute or focal bony abnormality. IMPRESSION: Left greater than right streaky opacities are unchanged since the most recent examination and could be due to pneumonia or atelectasis. Electronically Signed   By: Drusilla Kanner M.D.   On: 04/02/2020 11:50   CT Angio Chest PE W and/or Wo Contrast  Result Date: 04/02/2020 CLINICAL DATA:  Chest and back pain since yesterday, COVID-19 positive 10 days ago EXAM: CT ANGIOGRAPHY CHEST WITH CONTRAST TECHNIQUE: Multidetector CT imaging of the chest was performed using the standard protocol during bolus administration of intravenous contrast. Multiplanar CT image reconstructions and MIPs were obtained to evaluate the vascular anatomy. CONTRAST:  45mL OMNIPAQUE IOHEXOL 350 MG/ML SOLN COMPARISON:  04/02/2020 FINDINGS: Cardiovascular: This is a technically adequate evaluation of the pulmonary vasculature. There are bilateral segmental pulmonary emboli, greatest in the lower lobes. There is moderate clot burden, but no evidence of right heart strain. No pericardial effusion.  Thoracic aorta is unremarkable. Mediastinum/Nodes: No enlarged mediastinal, hilar, or axillary  lymph nodes. Thyroid gland, trachea, and esophagus demonstrate no significant findings. Lungs/Pleura: Patchy bilateral perihilar ground-glass airspace disease consistent with multifocal pneumonia and given history of COVID-19. Areas of denser consolidation within the dependent lower lobes compatible with atelectasis or infarct. Trace right pleural effusion. No pneumothorax. Central airways are patent. Upper Abdomen: No acute abnormality. Musculoskeletal: No acute or destructive bony lesions. Reconstructed images demonstrate no additional findings. Review of the MIP images confirms the above findings. IMPRESSION: 1. Bilateral segmental pulmonary emboli, with no evidence of right heart strain. 2. Patchy bilateral perihilar ground-glass airspace disease consistent with multifocal pneumonia, compatible with history of COVID 19. 3. Trace right pleural effusion. 4. Bibasilar areas of consolidation consistent with atelectasis or developing pulmonary infarct. Critical Value/emergent results were called by telephone at the time of interpretation on 04/02/2020 at 9:09 pm to provider Gerhard Munch , who verbally acknowledged these results. Electronically Signed   By: Sharlet Salina M.D.   On: 04/02/2020 21:10   DG Chest Portable 1 View  Result Date: 03/23/2020 CLINICAL DATA:  COVID EXAM: PORTABLE CHEST 1 VIEW COMPARISON:  March 15, 2018 FINDINGS: The heart size and mediastinal contours are within normal limits. Hazy airspace opacity seen at the right lung base. The visualized skeletal structures are unremarkable. IMPRESSION: Hazy airspace opacity at the right lung base which likely due to infectious etiology. Electronically Signed   By: Jonna Clark M.D.   On: 03/23/2020 20:08   VAS [place] LOWER EXTREMITY VENOUS (DVT)  Result Date: 04/03/2020  Lower Venous DVT Study Indications: Elevated ddimer.  Limitations: Body habitus and poor ultrasound/tissue interface. Comparison Study: no prior Performing Technologist: Blanch Media RVS  Examination Guidelines: A complete evaluation includes B-mode imaging, spectral Doppler, color Doppler, and power Doppler as needed of all accessible portions of each vessel. Bilateral testing  is considered an integral part of a complete examination. Limited examinations for reoccurring indications may be performed as noted. The reflux portion of the exam is performed with the patient in reverse Trendelenburg.  +---------+---------------+---------+-----------+----------+--------------+ RIGHT    CompressibilityPhasicitySpontaneityPropertiesThrombus Aging +---------+---------------+---------+-----------+----------+--------------+ CFV      Full           Yes      Yes                                 +---------+---------------+---------+-----------+----------+--------------+ SFJ      Full                                                        +---------+---------------+---------+-----------+----------+--------------+ FV Prox  Full                                                        +---------+---------------+---------+-----------+----------+--------------+ FV Mid   Full                                                        +---------+---------------+---------+-----------+----------+--------------+ FV Distal               Yes      Yes                                 +---------+---------------+---------+-----------+----------+--------------+ PFV      Full                                                        +---------+---------------+---------+-----------+----------+--------------+ POP      Full           Yes      Yes                                 +---------+---------------+---------+-----------+----------+--------------+ PTV      Full                                                        +---------+---------------+---------+-----------+----------+--------------+ PERO     Full                                                         +---------+---------------+---------+-----------+----------+--------------+   +---------+---------------+---------+-----------+----------+--------------+ LEFT     CompressibilityPhasicitySpontaneityPropertiesThrombus Aging +---------+---------------+---------+-----------+----------+--------------+ CFV  Full           Yes      Yes                                 +---------+---------------+---------+-----------+----------+--------------+ SFJ      Full                                                        +---------+---------------+---------+-----------+----------+--------------+ FV Prox  Full                                                        +---------+---------------+---------+-----------+----------+--------------+ FV Mid                  Yes      Yes                                 +---------+---------------+---------+-----------+----------+--------------+ FV Distal               Yes      Yes                                 +---------+---------------+---------+-----------+----------+--------------+ PFV      Full                                                        +---------+---------------+---------+-----------+----------+--------------+ POP      Full           Yes      Yes                                 +---------+---------------+---------+-----------+----------+--------------+ PTV      Full                                                        +---------+---------------+---------+-----------+----------+--------------+ PERO     Full                                                        +---------+---------------+---------+-----------+----------+--------------+     Summary: BILATERAL: - No evidence of deep vein thrombosis seen in the lower extremities, bilaterally. - No evidence of superficial venous thrombosis in the lower extremities, bilaterally. -No evidence of popliteal cyst, bilaterally.   *See table(s) above for measurements  and observations. Electronically signed by Waverly Ferrarihristopher Dickson MD on 04/03/2020 at 7:58:25 PM.    Final

## 2020-04-05 ENCOUNTER — Inpatient Hospital Stay (HOSPITAL_COMMUNITY): Payer: 59

## 2020-04-05 LAB — CBC WITH DIFFERENTIAL/PLATELET
Abs Immature Granulocytes: 0.24 10*3/uL — ABNORMAL HIGH (ref 0.00–0.07)
Basophils Absolute: 0 10*3/uL (ref 0.0–0.1)
Basophils Relative: 0 %
Eosinophils Absolute: 0 10*3/uL (ref 0.0–0.5)
Eosinophils Relative: 0 %
HCT: 35.6 % — ABNORMAL LOW (ref 36.0–46.0)
Hemoglobin: 11.1 g/dL — ABNORMAL LOW (ref 12.0–15.0)
Immature Granulocytes: 1 %
Lymphocytes Relative: 8 %
Lymphs Abs: 1.4 10*3/uL (ref 0.7–4.0)
MCH: 25.8 pg — ABNORMAL LOW (ref 26.0–34.0)
MCHC: 31.2 g/dL (ref 30.0–36.0)
MCV: 82.8 fL (ref 80.0–100.0)
Monocytes Absolute: 0.6 10*3/uL (ref 0.1–1.0)
Monocytes Relative: 3 %
Neutro Abs: 16 10*3/uL — ABNORMAL HIGH (ref 1.7–7.7)
Neutrophils Relative %: 88 %
Platelets: 393 10*3/uL (ref 150–400)
RBC: 4.3 MIL/uL (ref 3.87–5.11)
RDW: 14 % (ref 11.5–15.5)
WBC: 18.2 10*3/uL — ABNORMAL HIGH (ref 4.0–10.5)
nRBC: 0 % (ref 0.0–0.2)

## 2020-04-05 LAB — C-REACTIVE PROTEIN: CRP: 14.4 mg/dL — ABNORMAL HIGH (ref <1.0)

## 2020-04-05 LAB — COMPREHENSIVE METABOLIC PANEL
ALT: 109 U/L — ABNORMAL HIGH (ref 0–44)
AST: 87 U/L — ABNORMAL HIGH (ref 15–41)
Albumin: 2.8 g/dL — ABNORMAL LOW (ref 3.5–5.0)
Alkaline Phosphatase: 81 U/L (ref 38–126)
Anion gap: 13 (ref 5–15)
BUN: 7 mg/dL (ref 6–20)
CO2: 22 mmol/L (ref 22–32)
Calcium: 9.5 mg/dL (ref 8.9–10.3)
Chloride: 107 mmol/L (ref 98–111)
Creatinine, Ser: 0.68 mg/dL (ref 0.44–1.00)
GFR, Estimated: >60 mL/min (ref >60)
Glucose, Bld: 149 mg/dL — ABNORMAL HIGH (ref 70–99)
Potassium: 4.4 mmol/L (ref 3.5–5.1)
Sodium: 142 mmol/L (ref 135–145)
Total Bilirubin: 0.3 mg/dL (ref 0.3–1.2)
Total Protein: 7.3 g/dL (ref 6.5–8.1)

## 2020-04-05 LAB — D-DIMER, QUANTITATIVE: D-Dimer, Quant: 4.49 ug/mL-FEU — ABNORMAL HIGH (ref 0.00–0.50)

## 2020-04-05 LAB — MAGNESIUM: Magnesium: 2.5 mg/dL — ABNORMAL HIGH (ref 1.7–2.4)

## 2020-04-05 LAB — PROCALCITONIN: Procalcitonin: 0.21 ng/mL

## 2020-04-05 MED ORDER — PANTOPRAZOLE SODIUM 40 MG PO TBEC
40.0000 mg | DELAYED_RELEASE_TABLET | Freq: Every day | ORAL | Status: DC
  Administered 2020-04-05 – 2020-04-11 (×7): 40 mg via ORAL
  Filled 2020-04-05 (×7): qty 1

## 2020-04-05 MED ORDER — BARICITINIB 2 MG PO TABS
4.0000 mg | ORAL_TABLET | Freq: Every day | ORAL | Status: DC
  Administered 2020-04-05 – 2020-04-11 (×7): 4 mg via ORAL
  Filled 2020-04-05 (×7): qty 2

## 2020-04-05 MED ORDER — APIXABAN 5 MG PO TABS: 5.0000 mg | ORAL_TABLET | Freq: Two times a day (BID) | ORAL | Status: DC

## 2020-04-05 MED ORDER — APIXABAN (ELIQUIS) VTE STARTER PACK (10MG AND 5MG): ORAL_TABLET | ORAL | 0 refills | Status: DC

## 2020-04-05 MED ORDER — APIXABAN 5 MG PO TABS
10.0000 mg | ORAL_TABLET | Freq: Two times a day (BID) | ORAL | Status: DC
  Administered 2020-04-05 – 2020-04-11 (×12): 10 mg via ORAL
  Filled 2020-04-05 (×12): qty 2

## 2020-04-05 MED FILL — ELIQUIS STARTER PACK 5 MG T: 5 | 30 days supply | Qty: 74 | Fill #0

## 2020-04-05 NOTE — Plan of Care (Signed)
?  Problem: Clinical Measurements: ?Goal: Ability to maintain clinical measurements within normal limits will improve ?Outcome: Progressing ?Goal: Will remain free from infection ?Outcome: Progressing ?Goal: Diagnostic test results will improve ?Outcome: Progressing ?Goal: Respiratory complications will improve ?Outcome: Progressing ?  ?Problem: Activity: ?Goal: Risk for activity intolerance will decrease ?Outcome: Progressing ?  ?

## 2020-04-05 NOTE — TOC Benefit Eligibility Note (Signed)
Transition of Care [facility]) Benefit Eligibility Note    Patient Details  Name: Faith [name] MRN: [id number] Date of Birth: 13-Dec-1990   Medication/Dose: Carlena Hurl and Eliquis  Covered?: Yes  Tier: 3 Drug  Prescription Coverage Preferred Pharmacy: patient can use any pharmacy  Spoke with Person/Company/Phone Number:: [facility]  Co-Pay: Xarelto: $200 copay for 30 day retail   / Eliquis: $200 copay for 30 day retail  Prior Approval: No     Additional Notes: patient is eligible for use of rx coupon card    [employer] Phone Number: 04/05/2020, 11:10 AM

## 2020-04-05 NOTE — Progress Notes (Addendum)
PROGRESS NOTE                                                                                                                                                                                                             Patient Demographics:    Cambry Spampinato, is a 29 y.o. female, DOB - [birth year], ZOX:[id number]  Outpatient Primary MD for the patient is Patient, No Pcp Per    LOS - 3  Admit date - 04/02/2020    Chief Complaint  Patient presents with  . Chest Pain  . Back Pain       Brief Narrative (HPI from H&P)  - Catlynn Grondahl Molinari is a 29 y.o. female with medical history significant of essential hypertension not on any medication, morbid obesity, who was diagnosed with COVID-19 pneumonia on 03/23/20 -came to the hospital with chest pain.  In the ER was diagnosed with subsegmental PE along with COVID-19 pneumonia on CT chest and admitted to the hospital.   Subjective:    [name] today reports cough, reports dyspnea mainly with activity, no fever or chest pain.     Assessment  & Plan :     Acute hypoxic respiratory failure due to acute bilateral subsegmental PE with acute COVID-19 pneumonia  - she is unfortunately not vaccinated. - initial COVID-19 pneumonia diagnosis on 03/23/2020.  PE likely due to inflammation from COVID-19, on full dose Lovenox, will require total of 6  months of anticoagulation, will be transition to Eliquis . -Continue with IV steroids.  Will change Decadron to Solu-Medrol. -Continue with IV remdesivir -Appears comfortable on 2 L this morning, but significantly dyspneic with minimal activity, she was ambulated in the hallway, no significant dyspnea and hypoxia even on 8 L oxygen, so I have discussed about baricitinib with her, no history of tuberculosis, diverticulitis/bowel perforation or viral hepatitis, she is agreeable, she will be started on baricitinib . -Venous Dopplers  negative for DVT. -She was encouraged with incentive spirometry and flutter valve, out of bed to chair. -she is on antibiotics given elevated procalcitonin on admission, will treat for bacterial pneumonia as well, will change levofloxacin to azithromycin/Rocephin.   SpO2: 100 % O2 Flow Rate (L/min): 2 L/min  Recent Labs  Lab 04/02/20 1119 04/02/20 1848 04/03/20 0245 04/04/20 0055 04/05/20 0043  WBC 19.9*  --  16.5* 18.9*  18.2*  HGB 11.2*  --  10.9* 10.6* 11.1*  HCT 36.8  --  36.1 35.2* 35.6*  PLT 402*  --  356 346 393  CRP  --   --  25.5* 26.8* 14.4*  DDIMER  --  3.39* 3.38* 3.70* 4.49*  PROCALCITON  --   --  0.23 0.11 0.21  AST  --   --  14* 24 87*  ALT  --   --  20 30 109*  ALKPHOS  --   --  50 58 81  BILITOT  --   --  0.8 0.4 0.3  ALBUMIN  --   --  2.9* 2.8* 2.8*     Morbid obesity.  BMI 43.   - Follow with PCP for weight loss.  Clinical dehydration.  -  Hydrate with IV fluids  I-STAT pregnancy test negative   Condition - Fair  Family Communication  : I have discussed with patient, all her questions were answered in details .  father updated by phone 04/05/2020  Code Status : Full  Consults  : None  Procedures  :    CT chest.  Bilateral subsegmental PE and consolidation, bilateral groundglass opacities  PUD Prophylaxis : none  Disposition Plan  :    Status is: Inpatient  Remains inpatient appropriate because:IV treatments appropriate due to intensity of illness or inability to take PO   Dispo: The patient is from: Home              Anticipated d/c is to: Home              Anticipated d/c date is: 3 days              Patient currently is not medically stable to d/c.      DVT Prophylaxis  :  Lovenox   Lab Results  Component Value Date   PLT 393 04/05/2020    Diet :  Diet Order            Diet regular Room service appropriate? Yes; Fluid consistency: Thin  Diet effective now                  Inpatient Medications  Scheduled  Meds: . azithromycin  500 mg Oral Daily  . enoxaparin (LOVENOX) injection  1 mg/kg Subcutaneous Q12H  . influenza vac split quadrivalent PF  0.5 mL Intramuscular Tomorrow-1000  . Ipratropium-Albuterol  1 puff Inhalation Q6H  . methylPREDNISolone (SOLU-MEDROL) injection  60 mg Intravenous Q8H  . multivitamin with minerals  1 tablet Oral Daily   Continuous Infusions: . cefTRIAXone (ROCEPHIN)  IV Stopped (04/05/20 1006)  . remdesivir 100 mg in NS 100 mL Stopped (04/05/20 0924)   PRN Meds:.acetaminophen, chlorpheniramine-HYDROcodone, guaiFENesin-dextromethorphan, morphine injection, [DISCONTINUED] ondansetron **OR** ondansetron (ZOFRAN) IV, traMADol  Antibiotics  :    Anti-infectives (From admission, onward)   Start     Dose/Rate Route Frequency Ordered Stop   04/05/20 1000  remdesivir 100 mg in sodium chloride 0.9 % 100 mL IVPB       "Followed by" Linked Group Details   100 mg 200 mL/hr over 30 Minutes Intravenous Daily 04/04/20 1034 04/09/20 0959   04/04/20 1130  remdesivir 200 mg in sodium chloride 0.9% 250 mL IVPB       "Followed by" Linked Group Details   200 mg 580 mL/hr over 30 Minutes Intravenous Once 04/04/20 1034 04/04/20 1459   04/04/20 1130  cefTRIAXone (ROCEPHIN) 1 g in sodium chloride 0.9 % 100 mL  IVPB        1 g 200 mL/hr over 30 Minutes Intravenous Every 24 hours 04/04/20 1034 04/07/20 2359   04/04/20 1130  azithromycin (ZITHROMAX) tablet 500 mg        500 mg Oral Daily 04/04/20 1034 04/07/20 2359   04/03/20 1000  levofloxacin (LEVAQUIN) tablet 750 mg  Status:  Discontinued        750 mg Oral Daily 04/03/20 0906 04/04/20 1035        Brady Schiller M.D on 04/05/2020 at 11:19 AM  To page go to [email or web address]   [facility] -  Office  [phone]    See all Orders from today for further details    Objective:   Vitals:   04/04/20 1935 04/04/20 2337 04/05/20 0419 04/05/20 0758  BP: 134/74 (!) 143/93 127/78 (!) 144/95  Pulse: 96 76 74 64  Resp: (!)  23 (!) Temp: 97.8 F (36.6 C) 97.7 F (36.5 C) 97.9 F (36.6 C) 98 F (36.7 C)  TempSrc: Oral Axillary Axillary Oral  SpO2: 94% 100% 100% 100%  Weight:      Height:        Wt Readings from Last 3 Encounters:  04/02/20 104.3 kg  02/15/18 98 kg  05/25/17 106.1 kg     Intake/Output Summary (Last 24 hours) at 04/05/2020 1119 Last data filed at 04/05/2020 1006 Gross per 24 hour  Intake 1250 ml  Output --  Net 1250 ml     Physical Exam  Awake Alert, Oriented X 3, No new F.N deficits, Normal affect Symmetrical Chest wall movement, Good air movement bilaterally, CTAB RRR +ve B.Sounds, Abd Soft, No tenderness, No rebound - guarding or rigidity. No Cyanosis, Clubbing or edema, No new Rash or bruise      Data Review:    CBC Recent Labs  Lab 04/02/20 1119 04/03/20 0245 04/04/20 0055 04/05/20 0043  WBC 19.9* 16.5* 18.9* 18.2*  HGB 11.2* 10.9* 10.6* 11.1*  HCT 36.8 36.1 35.2* 35.6*  PLT 402* 356 346 393  MCV 83.8 84.3 84.0 82.8  MCH 25.5* 25.5* 25.3* 25.8*  MCHC 30.4 30.2 30.1 31.2  RDW 14.2 14.3 13.9 14.0  LYMPHSABS  --  1.9 1.3 1.4  MONOABS  --  1.3* 1.4* 0.6  EOSABS  --  0.0 0.0 0.0  BASOSABS  --  0.0 0.0 0.0    Recent Labs  Lab 04/02/20 1119 04/02/20 1848 04/03/20 0245 04/04/20 0055 04/05/20 0043  NA 137  --  138 138 142  K 4.2  --  3.9 4.6 4.4  CL 103  --  102 104 107  CO2 21*  --  GLUCOSE 125*  --  124* 125* 149*  BUN 8  --  CREATININE 0.82  --  0.85 0.87 0.68  CALCIUM 9.3  --  8.9 9.2 9.5  AST  --   --  14* 24 87*  ALT  --   --  20 30 109*  ALKPHOS  --   --  50 58 81  BILITOT  --   --  0.8 0.4 0.3  ALBUMIN  --   --  2.9* 2.8* 2.8*  MG  --   --   --  2.5* 2.5*  CRP  --   --  25.5* 26.8* 14.4*  DDIMER  --  3.39* 3.38* 3.70* 4.49*  PROCALCITON  --   --  0.23 0.11 0.21    ------------------------------------------------------------------------------------------------------------------ No  results for input(s): CHOL,  HDL, LDLCALC, TRIG, CHOLHDL, LDLDIRECT in the last 72 hours.  No results found for: HGBA1C ------------------------------------------------------------------------------------------------------------------ No results for input(s): TSH, T4TOTAL, T3FREE, THYROIDAB in the last 72 hours.  Invalid input(s): FREET3  Cardiac Enzymes No results for input(s): CKMB, TROPONINI, MYOGLOBIN in the last 168 hours.  Invalid input(s): CK ------------------------------------------------------------------------------------------------------------------ No results found for: BNP  Micro Results No results found for this or any previous visit (from the past 240 hour(s)).  Radiology Reports DG Chest 1 View  Result Date: 04/02/2020 CLINICAL DATA:  Shortness of breath, cough and low back pain. The patient tested positive for COVID-19 10 days ago. EXAM: CHEST  1 VIEW COMPARISON:  PA and lateral chest 03/15/2018. Single-view of the chest 03/23/2020. FINDINGS: Lung volumes are low. Streaky bibasilar airspace opacities are greater on the left and unchanged. No pneumothorax or pleural effusion. Heart size is normal. No acute or focal bony abnormality. IMPRESSION: Left greater than right streaky opacities are unchanged since the most recent examination and could be due to pneumonia or atelectasis. Electronically Signed   By: Drusilla Kanner M.D.   On: 04/02/2020 11:50   CT Angio Chest PE W and/or Wo Contrast  Result Date: 04/02/2020 CLINICAL DATA:  Chest and back pain since yesterday, COVID-19 positive 10 days ago EXAM: CT ANGIOGRAPHY CHEST WITH CONTRAST TECHNIQUE: Multidetector CT imaging of the chest was performed using the standard protocol during bolus administration of intravenous contrast. Multiplanar CT image reconstructions and MIPs were obtained to evaluate the vascular anatomy. CONTRAST:  66mL OMNIPAQUE IOHEXOL 350 MG/ML SOLN COMPARISON:  04/02/2020 FINDINGS: Cardiovascular: This is a technically adequate  evaluation of the pulmonary vasculature. There are bilateral segmental pulmonary emboli, greatest in the lower lobes. There is moderate clot burden, but no evidence of right heart strain. No pericardial effusion.  Thoracic aorta is unremarkable. Mediastinum/Nodes: No enlarged mediastinal, hilar, or axillary lymph nodes. Thyroid gland, trachea, and esophagus demonstrate no significant findings. Lungs/Pleura: Patchy bilateral perihilar ground-glass airspace disease consistent with multifocal pneumonia and given history of COVID-19. Areas of denser consolidation within the dependent lower lobes compatible with atelectasis or infarct. Trace right pleural effusion. No pneumothorax. Central airways are patent. Upper Abdomen: No acute abnormality. Musculoskeletal: No acute or destructive bony lesions. Reconstructed images demonstrate no additional findings. Review of the MIP images confirms the above findings. IMPRESSION: 1. Bilateral segmental pulmonary emboli, with no evidence of right heart strain. 2. Patchy bilateral perihilar ground-glass airspace disease consistent with multifocal pneumonia, compatible with history of COVID 19. 3. Trace right pleural effusion. 4. Bibasilar areas of consolidation consistent with atelectasis or developing pulmonary infarct. Critical Value/emergent results were called by telephone at the time of interpretation on 04/02/2020 at 9:09 pm to provider Gerhard Munch , who verbally acknowledged these results. Electronically Signed   By: Sharlet Salina M.D.   On: 04/02/2020 21:10   DG Chest Portable 1 View  Result Date: 03/23/2020 CLINICAL DATA:  COVID EXAM: PORTABLE CHEST 1 VIEW COMPARISON:  March 15, 2018 FINDINGS: The heart size and mediastinal contours are within normal limits. Hazy airspace opacity seen at the right lung base. The visualized skeletal structures are unremarkable. IMPRESSION: Hazy airspace opacity at the right lung base which likely due to infectious etiology.  Electronically Signed   By: Jonna Clark M.D.   On: 03/23/2020 20:08   VAS [place] LOWER EXTREMITY VENOUS (DVT)  Result Date: 04/03/2020  Lower Venous DVT Study Indications: Elevated ddimer.  Limitations: Body habitus and poor ultrasound/tissue interface. Comparison  Study: no prior Performing Technologist: Blanch Media RVS  Examination Guidelines: A complete evaluation includes B-mode imaging, spectral Doppler, color Doppler, and power Doppler as needed of all accessible portions of each vessel. Bilateral testing is considered an integral part of a complete examination. Limited examinations for reoccurring indications may be performed as noted. The reflux portion of the exam is performed with the patient in reverse Trendelenburg.  +---------+---------------+---------+-----------+----------+--------------+ RIGHT    CompressibilityPhasicitySpontaneityPropertiesThrombus Aging +---------+---------------+---------+-----------+----------+--------------+ CFV      Full           Yes      Yes                                 +---------+---------------+---------+-----------+----------+--------------+ SFJ      Full                                                        +---------+---------------+---------+-----------+----------+--------------+ FV Prox  Full                                                        +---------+---------------+---------+-----------+----------+--------------+ FV Mid   Full                                                        +---------+---------------+---------+-----------+----------+--------------+ FV Distal               Yes      Yes                                 +---------+---------------+---------+-----------+----------+--------------+ PFV      Full                                                        +---------+---------------+---------+-----------+----------+--------------+ POP      Full           Yes      Yes                                  +---------+---------------+---------+-----------+----------+--------------+ PTV      Full                                                        +---------+---------------+---------+-----------+----------+--------------+ PERO     Full                                                        +---------+---------------+---------+-----------+----------+--------------+   +---------+---------------+---------+-----------+----------+--------------+  LEFT     CompressibilityPhasicitySpontaneityPropertiesThrombus Aging +---------+---------------+---------+-----------+----------+--------------+ CFV      Full           Yes      Yes                                 +---------+---------------+---------+-----------+----------+--------------+ SFJ      Full                                                        +---------+---------------+---------+-----------+----------+--------------+ FV Prox  Full                                                        +---------+---------------+---------+-----------+----------+--------------+ FV Mid                  Yes      Yes                                 +---------+---------------+---------+-----------+----------+--------------+ FV Distal               Yes      Yes                                 +---------+---------------+---------+-----------+----------+--------------+ PFV      Full                                                        +---------+---------------+---------+-----------+----------+--------------+ POP      Full           Yes      Yes                                 +---------+---------------+---------+-----------+----------+--------------+ PTV      Full                                                        +---------+---------------+---------+-----------+----------+--------------+ PERO     Full                                                         +---------+---------------+---------+-----------+----------+--------------+     Summary: BILATERAL: - No evidence of deep vein thrombosis seen in the lower extremities, bilaterally. - No evidence of superficial venous thrombosis in the lower extremities, bilaterally. -No evidence of popliteal cyst, bilaterally.   *See table(s) above for measurements and observations. Electronically signed by Deitra Mayo  MD on 04/03/2020 at 7:58:25 PM.    Final

## 2020-04-05 NOTE — Progress Notes (Signed)
SATURATION QUALIFICATIONS:   Patient Saturations on Room Air at Rest = 84%  Patient Saturations on Room Air while Ambulating = did not assess  Patient Saturations on 8 Liters of oxygen while Ambulating = 82%  Please briefly explain why patient needs home oxygen:   Pt unable to complete walking test. Pt desat to lower 80s on 8 liters while ambulating and begin to c/o SOB, acute resp distress noted.

## 2020-04-05 NOTE — Discharge Instructions (Addendum)
Follow with Primary MD  in 7 days   Get CBC, CMP, 2 view Chest X ray -  checked next visit within 1 week by Primary MD   Activity: As tolerated with Full fall precautions use walker/cane & assistance as needed  Disposition Home    Diet: Heart Healthy Low Carb  Special Instructions: If you have smoked or chewed Tobacco  in the last 2 yrs please stop smoking, stop any regular Alcohol  and or any Recreational drug use.  On your next visit with your primary care physician please Get Medicines reviewed and adjusted.  Please request your Prim.MD to go over all Hospital Tests and Procedure/Radiological results at the follow up, please get all Hospital records sent to your Prim MD by signing hospital release before you go home.  If you experience worsening of your admission symptoms, develop shortness of breath, life threatening emergency, suicidal or homicidal thoughts you must seek medical attention immediately by calling 911 or calling your MD immediately  if symptoms less severe.  You Must read complete instructions/literature along with all the possible adverse reactions/side effects for all the Medicines you take and that have been prescribed to you. Take any new Medicines after you have completely understood and accpet all the possible adverse reactions/side effects.          Person Under Monitoring Name: Faith [name] [facility]  Location: [address] [place] [address]   Infection Prevention Recommendations for Individuals Confirmed to have, or Being Evaluated for, 2019 Novel Coronavirus (COVID-19) Infection Who Receive Care at Home  Individuals who are confirmed to have, or are being evaluated for, COVID-19 should follow the prevention steps below until a healthcare provider or local or state health department says they can return to normal activities.  Stay home except to get medical care You should restrict activities outside your home, except for getting  medical care. Do not go to work, school, or public areas, and do not use public transportation or taxis.  Call ahead before visiting your doctor Before your medical appointment, call the healthcare provider and tell them that you have, or are being evaluated for, COVID-19 infection. This will help the healthcare provider's office take steps to keep other people from getting infected. Ask your healthcare provider to call the local or state health department.  Monitor your symptoms Seek prompt medical attention if your illness is worsening (e.g., difficulty breathing). Before going to your medical appointment, call the healthcare provider and tell them that you have, or are being evaluated for, COVID-19 infection. Ask your healthcare provider to call the local or state health department.  Wear a facemask You should wear a facemask that covers your nose and mouth when you are in the same room with other people and when you visit a healthcare provider. People who live with or visit you should also wear a facemask while they are in the same room with you.  Separate yourself from other people in your home As much as possible, you should stay in a different room from other people in your home. Also, you should use a separate bathroom, if available.  Avoid sharing household items You should not share dishes, drinking glasses, cups, eating utensils, towels, bedding, or other items with other people in your home. After using these items, you should wash them thoroughly with soap and water.  Cover your coughs and sneezes Cover your mouth and nose with a tissue when you cough or sneeze, or you can cough  or sneeze into your sleeve. Throw used tissues in a lined trash can, and immediately wash your hands with soap and water for at least 20 seconds or use an alcohol-based hand rub.  Wash your [facility] your hands often and thoroughly with soap and water for at least 20 seconds. You can use an  alcohol-based hand sanitizer if soap and water are not available and if your hands are not visibly dirty. Avoid touching your eyes, nose, and mouth with unwashed hands.   Prevention Steps for Caregivers and Household Members of Individuals Confirmed to have, or Being Evaluated for, COVID-19 Infection Being Cared for in the Home  If you live with, or provide care at home for, a person confirmed to have, or being evaluated for, COVID-19 infection please follow these guidelines to prevent infection:  Follow healthcare provider's instructions Make sure that you understand and can help the patient follow any healthcare provider instructions for all care.  Provide for the patient's basic needs You should help the patient with basic needs in the home and provide support for getting groceries, prescriptions, and other personal needs.  Monitor the patient's symptoms If they are getting sicker, call his or her medical provider and tell them that the patient has, or is being evaluated for, COVID-19 infection. This will help the healthcare provider's office take steps to keep other people from getting infected. Ask the healthcare provider to call the local or state health department.  Limit the number of people who have contact with the patient  If possible, have only one caregiver for the patient.  Other household members should stay in another home or place of residence. If this is not possible, they should stay  in another room, or be separated from the patient as much as possible. Use a separate bathroom, if available.  Restrict visitors who do not have an essential need to be in the home.  Keep older adults, very young children, and other sick people away from the patient Keep older adults, very young children, and those who have compromised immune systems or chronic health conditions away from the patient. This includes people with chronic heart, lung, or kidney conditions, diabetes, and  cancer.  Ensure good ventilation Make sure that shared spaces in the home have good air flow, such as from an air conditioner or an opened window, weather permitting.  Wash your hands often  Wash your hands often and thoroughly with soap and water for at least 20 seconds. You can use an alcohol based hand sanitizer if soap and water are not available and if your hands are not visibly dirty.  Avoid touching your eyes, nose, and mouth with unwashed hands.  Use disposable paper towels to dry your hands. If not available, use dedicated cloth towels and replace them when they become wet.  Wear a facemask and gloves  Wear a disposable facemask at all times in the room and gloves when you touch or have contact with the patient's blood, body fluids, and/or secretions or excretions, such as sweat, saliva, sputum, nasal mucus, vomit, urine, or feces.  Ensure the mask fits over your nose and mouth tightly, and do not touch it during use.  Throw out disposable facemasks and gloves after using them. Do not reuse.  Wash your hands immediately after removing your facemask and gloves.  If your personal clothing becomes contaminated, carefully remove clothing and launder. Wash your hands after handling contaminated clothing.  Place all used disposable facemasks, gloves, and other  waste in a lined container before disposing them with other household waste.  Remove gloves and wash your hands immediately after handling these items.  Do not share dishes, glasses, or other household items with the patient  Avoid sharing household items. You should not share dishes, drinking glasses, cups, eating utensils, towels, bedding, or other items with a patient who is confirmed to have, or being evaluated for, COVID-19 infection.  After the person uses these items, you should wash them thoroughly with soap and water.  Wash laundry thoroughly  Immediately remove and wash clothes or bedding that have blood, body  fluids, and/or secretions or excretions, such as sweat, saliva, sputum, nasal mucus, vomit, urine, or feces, on them.  Wear gloves when handling laundry from the patient.  Read and follow directions on labels of laundry or clothing items and detergent. In general, wash and dry with the warmest temperatures recommended on the label.  Clean all areas the individual has used often  Clean all touchable surfaces, such as counters, tabletops, doorknobs, bathroom fixtures, toilets, phones, keyboards, tablets, and bedside tables, every day. Also, clean any surfaces that may have blood, body fluids, and/or secretions or excretions on them.  Wear gloves when cleaning surfaces the patient has come in contact with.  Use a diluted bleach solution (e.g., dilute bleach with 1 part bleach and 10 parts water) or a household disinfectant with a label that says EPA-registered for coronaviruses. To make a bleach solution at home, add 1 tablespoon of bleach to 1 quart (4 cups) of water. For a larger supply, add  cup of bleach to 1 gallon (16 cups) of water.  Read labels of cleaning products and follow recommendations provided on product labels. Labels contain instructions for safe and effective use of the cleaning product including precautions you should take when applying the product, such as wearing gloves or eye protection and making sure you have good ventilation during use of the product.  Remove gloves and wash hands immediately after cleaning.  Monitor yourself for signs and symptoms of illness Caregivers and household members are considered close contacts, should monitor their health, and will be asked to limit movement outside of the home to the extent possible. Follow the monitoring steps for close contacts listed on the symptom monitoring form.   ? If you have additional questions, contact your local health department or call the epidemiologist on call at [phone] (available 24/7). ? This  guidance is subject to change. For the most up-to-date guidance from [facility], please refer to their website: [employer]Metro.hu   Information on my medicine - ELIQUIS (apixaban)  Why was Eliquis prescribed for you? Eliquis was prescribed to treat blood clots that may have been found in the veins of your legs (deep vein thrombosis) or in your lungs (pulmonary embolism) and to reduce the risk of them occurring again.  What do You need to know about Eliquis ? The starting dose is 10 mg (two 5 mg tablets) taken TWICE daily for the FIRST SEVEN (7) DAYS, then the dose is reduced to ONE 5 mg tablet taken TWICE daily.  Eliquis may be taken with or without food.   Try to take the dose about the same time in the morning and in the evening. If you have difficulty swallowing the tablet whole please discuss with your pharmacist how to take the medication safely.  Take Eliquis exactly as prescribed and DO NOT stop taking Eliquis without talking to the doctor who prescribed the medication.  Stopping may  increase your risk of developing a new blood clot.  Refill your prescription before you run out.  After discharge, you should have regular check-up appointments with your healthcare provider that is prescribing your Eliquis.    What do you do if you miss a dose? If a dose of ELIQUIS is not taken at the scheduled time, take it as soon as possible on the same day and twice-daily administration should be resumed. The dose should not be doubled to make up for a missed dose.  Important Safety Information A possible side effect of Eliquis is bleeding. You should call your healthcare provider right away if you experience any of the following: ? Bleeding from an injury or your nose that does not stop. ? Unusual colored urine (red or dark brown) or unusual colored stools (red or black). ? Unusual bruising for unknown reasons. ? A serious fall or if you  hit your head (even if there is no bleeding).  Some medicines may interact with Eliquis and might increase your risk of bleeding or clotting while on Eliquis. To help avoid this, consult your healthcare provider or pharmacist prior to using any new prescription or non-prescription medications, including herbals, vitamins, non-steroidal anti-inflammatory drugs (NSAIDs) and supplements.  This website has more information on Eliquis (apixaban): http://www.eliquis.com/eliquis/home

## 2020-04-05 NOTE — Progress Notes (Signed)
Benefit check requested for Eliquis.   Faith Camille LCSW

## 2020-04-05 NOTE — Progress Notes (Signed)
Post-COVID Clinic appointment made and info placed on AVS.   Cristobal Goldmann LCSW

## 2020-04-05 NOTE — Progress Notes (Addendum)
ANTICOAGULATION CONSULT NOTE Pharmacy Consult for Lovenox to Eliquis Indication: pulmonary embolus  No Known Allergies  Patient Measurements: Height: 5\' 1"  (154.9 cm) Weight: 104.3 kg (230 lb) IBW/kg (Calculated) : 47.8  Vital Signs: Temp: 98 F (36.7 C) (12/02 0758) Temp Source: Oral (12/02 0758) BP: 144/95 (12/02 0758) Pulse Rate: 64 (12/02 0758)  Labs: Recent Labs    04/02/20 1119 04/02/20 1119 04/02/20 1848 04/03/20 0245 04/03/20 0245 04/04/20 0055 04/05/20 0043  HGB 11.2*   < >  --  10.9*   < > 10.6* 11.1*  HCT 36.8   < >  --  36.1  --  35.2* 35.6*  PLT 402*   < >  --  356  --  346 393  CREATININE 0.82   < >  --  0.85  --  0.87 0.68  TROPONINIHS <2  --  4  --   --   --   --    < > = values in this interval not displayed.    Estimated Creatinine Clearance: 114.3 mL/min (by C-G formula based on SCr of 0.68 mg/dL).  Assessment: 29 y.o. female presents 10 days after being diagnosed with COVID-19 infection, now with chest pain, found to have pulmonary embolism.   Continues on Lovenox - > to begin Elqiuis tonight at 10 mg po BID x 7 days then 5 mg po BID  Eliquis education complete - made her aware of her 200 dollar copay - she will be receiving her first month's worth of medication for free from the [facility] pharmacy.  A copay card will be given to her, which will bring the next few months worth of medication down to 10 dollars a months.  Made her aware that she will have to activate the card and take to her pharmacy with her next fill  Goal of Therapy:  Anti-Xa level 0.6-1 units/ml 4hrs after LMWH dose given Monitor platelets by anticoagulation protocol: Yes   Plan:  Lovenox to Eliquis Monitor CBC, renal function, s/sx bleeding  Thank you [facility], PharmD 04/05/2020 8:07 AM  Please check [email or web address] for unit-specific pharmacy phone numbers.

## 2020-04-06 LAB — CBC WITH DIFFERENTIAL/PLATELET
Abs Immature Granulocytes: 0.49 10*3/uL — ABNORMAL HIGH (ref 0.00–0.07)
Basophils Absolute: 0 10*3/uL (ref 0.0–0.1)
Basophils Relative: 0 %
Eosinophils Absolute: 0 10*3/uL (ref 0.0–0.5)
Eosinophils Relative: 0 %
HCT: 35.1 % — ABNORMAL LOW (ref 36.0–46.0)
Hemoglobin: 10.8 g/dL — ABNORMAL LOW (ref 12.0–15.0)
Immature Granulocytes: 3 %
Lymphocytes Relative: 12 %
Lymphs Abs: 2.1 10*3/uL (ref 0.7–4.0)
MCH: 25.4 pg — ABNORMAL LOW (ref 26.0–34.0)
MCHC: 30.8 g/dL (ref 30.0–36.0)
MCV: 82.6 fL (ref 80.0–100.0)
Monocytes Absolute: 0.6 10*3/uL (ref 0.1–1.0)
Monocytes Relative: 3 %
Neutro Abs: 14.5 10*3/uL — ABNORMAL HIGH (ref 1.7–7.7)
Neutrophils Relative %: 82 %
Platelets: 414 10*3/uL — ABNORMAL HIGH (ref 150–400)
RBC: 4.25 MIL/uL (ref 3.87–5.11)
RDW: 13.7 % (ref 11.5–15.5)
WBC: 17.6 10*3/uL — ABNORMAL HIGH (ref 4.0–10.5)
nRBC: 0 % (ref 0.0–0.2)

## 2020-04-06 LAB — D-DIMER, QUANTITATIVE: D-Dimer, Quant: 5.12 ug/mL-FEU — ABNORMAL HIGH (ref 0.00–0.50)

## 2020-04-06 LAB — GLUCOSE, CAPILLARY
Glucose-Capillary: 237 mg/dL — ABNORMAL HIGH (ref 70–99)
Glucose-Capillary: 271 mg/dL — ABNORMAL HIGH (ref 70–99)

## 2020-04-06 LAB — COMPREHENSIVE METABOLIC PANEL
ALT: 119 U/L — ABNORMAL HIGH (ref 0–44)
AST: 38 U/L (ref 15–41)
Albumin: 2.7 g/dL — ABNORMAL LOW (ref 3.5–5.0)
Alkaline Phosphatase: 71 U/L (ref 38–126)
Anion gap: 12 (ref 5–15)
BUN: 10 mg/dL (ref 6–20)
CO2: 23 mmol/L (ref 22–32)
Calcium: 9.4 mg/dL (ref 8.9–10.3)
Chloride: 105 mmol/L (ref 98–111)
Creatinine, Ser: 0.63 mg/dL (ref 0.44–1.00)
GFR, Estimated: >60 mL/min (ref >60)
Glucose, Bld: 200 mg/dL — ABNORMAL HIGH (ref 70–99)
Potassium: 4.5 mmol/L (ref 3.5–5.1)
Sodium: 140 mmol/L (ref 135–145)
Total Bilirubin: 0.6 mg/dL (ref 0.3–1.2)
Total Protein: 6.9 g/dL (ref 6.5–8.1)

## 2020-04-06 LAB — MAGNESIUM: Magnesium: 2.4 mg/dL (ref 1.7–2.4)

## 2020-04-06 LAB — C-REACTIVE PROTEIN: CRP: 5.4 mg/dL — ABNORMAL HIGH (ref <1.0)

## 2020-04-06 MED ORDER — INSULIN ASPART 100 UNIT/ML ~~LOC~~ SOLN
0.0000 [IU] | Freq: Every day | SUBCUTANEOUS | Status: DC
  Administered 2020-04-06: 3 [IU] via SUBCUTANEOUS
  Administered 2020-04-07: 5 [IU] via SUBCUTANEOUS
  Administered 2020-04-08: 3 [IU] via SUBCUTANEOUS
  Administered 2020-04-09: 4 [IU] via SUBCUTANEOUS
  Administered 2020-04-10: 3 [IU] via SUBCUTANEOUS

## 2020-04-06 MED ORDER — MORPHINE SULFATE (PF) 2 MG/ML IV SOLN
1.0000 mg | INTRAVENOUS | Status: DC | PRN
  Administered 2020-04-06 – 2020-04-07 (×2): 1 mg via INTRAVENOUS
  Filled 2020-04-06 (×2): qty 1

## 2020-04-06 MED ORDER — INSULIN ASPART 100 UNIT/ML ~~LOC~~ SOLN
0.0000 [IU] | Freq: Three times a day (TID) | SUBCUTANEOUS | Status: DC
  Administered 2020-04-06: 5 [IU] via SUBCUTANEOUS
  Administered 2020-04-07 (×2): 8 [IU] via SUBCUTANEOUS
  Administered 2020-04-07: 11 [IU] via SUBCUTANEOUS
  Administered 2020-04-08 (×2): 5 [IU] via SUBCUTANEOUS
  Administered 2020-04-08: 11 [IU] via SUBCUTANEOUS
  Administered 2020-04-09: 8 [IU] via SUBCUTANEOUS
  Administered 2020-04-09: 11 [IU] via SUBCUTANEOUS
  Administered 2020-04-09: 8 [IU] via SUBCUTANEOUS
  Administered 2020-04-10: 5 [IU] via SUBCUTANEOUS
  Administered 2020-04-10: 3 [IU] via SUBCUTANEOUS

## 2020-04-06 NOTE — Progress Notes (Signed)
PROGRESS NOTE                                                                                                                                                                                                             Patient Demographics:    Faith [name], is a 29 y.o. female, DOB - 04-14-1991, JIR:[id number]  Outpatient Primary MD for the patient is Patient, No Pcp Per    LOS - 4  Admit date - 04/02/2020    Chief Complaint  Patient presents with  . Chest Pain  . Back Pain       Brief Narrative (HPI from H&P)  - Faith [name] is a 29 y.o. female with medical history significant of essential hypertension not on any medication, morbid obesity, who was diagnosed with COVID-19 pneumonia on 03/23/20 -came to the hospital with chest pain.  In the ER was diagnosed with subsegmental PE along with COVID-19 pneumonia on CT chest and admitted to the hospital.   Subjective:    Faith [name] today reports cough, dyspnea mainly with activity, no fever, no chest pain.     Assessment  & Plan :     Acute hypoxic respiratory failure due to acute bilateral subsegmental PE with acute COVID-19 pneumonia  - she is unfortunately not vaccinated. - initial COVID-19 pneumonia diagnosis on 03/23/2020.  PE likely due to inflammation from COVID-19, on full dose Lovenox, will require total of 6  months of anticoagulation, currently transitioned to Eliquis. -Continue with IV steroids.  Decadron changed to Solu-Medrol. -Continue with IV remdesivir -I have discussed baricitinib with the patient, she denies any history of tuberculosis, diverticulitis, bowel perforation, viral hepatitis, explained risks and benefits, she is agreeable to proceed, started on baricitinib 12/2.  . -Is on 2 L at rest, but significantly dyspneic, and hypoxic with activity, she is up to 8 L via nasal cannula with activity yesterday.   -Venous Dopplers  negative for DVT. -She was encouraged with incentive spirometry and flutter valve, out of bed to chair. -she is on antibiotics given elevated procalcitonin on admission, will treat for bacterial pneumonia as well, changed levofloxacin to azithromycin/Rocephin.   SpO2: 95 % O2 Flow Rate (L/min): 4 L/min  Recent Labs  Lab 04/02/20 1119 04/02/20 1848 04/03/20 0245 04/04/20 0055 04/05/20 0043 04/06/20 0148  WBC 19.9*  --  16.5*  18.9* 18.2* 17.6*  HGB 11.2*  --  10.9* 10.6* 11.1* 10.8*  HCT 36.8  --  36.1 35.2* 35.6* 35.1*  PLT 402*  --  356 346 393 414*  CRP  --   --  25.5* 26.8* 14.4* 5.4*  DDIMER  --  3.39* 3.38* 3.70* 4.49* 5.12*  PROCALCITON  --   --  0.23 0.11 0.21  --   AST  --   --  14* 24 87* 38  ALT  --   --  20 30 109* 119*  ALKPHOS  --   --  50 58 81 71  BILITOT  --   --  0.8 0.4 0.3 0.6  ALBUMIN  --   --  2.9* 2.8* 2.8* 2.7*     Morbid obesity.  BMI 43.   - Follow with PCP for weight loss.  Clinical dehydration.  -  Hydrate with IV fluids  I-STAT pregnancy test negative   Condition - Fair  Family Communication  : I have discussed with patient, all her questions were answered in details .  father updated by phone 04/05/2020  Code Status : Full  Consults  : None  Procedures  :    CT chest.  Bilateral subsegmental PE and consolidation, bilateral groundglass opacities  PUD Prophylaxis : none  Disposition Plan  :    Status is: Inpatient  Remains inpatient appropriate because:IV treatments appropriate due to intensity of illness or inability to take PO   Dispo: The patient is from: Home              Anticipated d/c is to: Home              Anticipated d/c date is: 3 days              Patient currently is not medically stable to d/c.      DVT Prophylaxis  :  Lovenox   Lab Results  Component Value Date   PLT 414 (H) 04/06/2020    Diet :  Diet Order            Diet regular Room service appropriate? Yes; Fluid consistency: Thin  Diet  effective now                  Inpatient Medications  Scheduled Meds: . apixaban  10 mg Oral BID   Followed by  . [START ON 04/12/2020] apixaban  5 mg Oral BID  . azithromycin  500 mg Oral Daily  . baricitinib  4 mg Oral Daily  . influenza vac split quadrivalent PF  0.5 mL Intramuscular Tomorrow-1000  . Ipratropium-Albuterol  1 puff Inhalation Q6H  . methylPREDNISolone (SOLU-MEDROL) injection  60 mg Intravenous Q8H  . multivitamin with minerals  1 tablet Oral Daily  . pantoprazole  40 mg Oral Daily   Continuous Infusions: . cefTRIAXone (ROCEPHIN)  IV Stopped (04/05/20 1006)  . remdesivir 100 mg in NS 100 mL 100 mg (04/06/20 1002)   PRN Meds:.acetaminophen, chlorpheniramine-HYDROcodone, guaiFENesin-dextromethorphan, morphine injection, [DISCONTINUED] ondansetron **OR** ondansetron (ZOFRAN) IV, traMADol  Antibiotics  :    Anti-infectives (From admission, onward)   Start     Dose/Rate Route Frequency Ordered Stop   04/05/20 1000  remdesivir 100 mg in sodium chloride 0.9 % 100 mL IVPB       "Followed by" Linked Group Details   100 mg 200 mL/hr over 30 Minutes Intravenous Daily 04/04/20 1034 04/09/20 0959   04/04/20 1130  remdesivir 200 mg in sodium chloride  0.9% 250 mL IVPB       "Followed by" Linked Group Details   200 mg 580 mL/hr over 30 Minutes Intravenous Once 04/04/20 1034 04/04/20 1459   04/04/20 1130  cefTRIAXone (ROCEPHIN) 1 g in sodium chloride 0.9 % 100 mL IVPB        1 g 200 mL/hr over 30 Minutes Intravenous Every 24 hours 04/04/20 1034 04/07/20 2359   04/04/20 1130  azithromycin (ZITHROMAX) tablet 500 mg        500 mg Oral Daily 04/04/20 1034 04/07/20 2359   04/03/20 1000  levofloxacin (LEVAQUIN) tablet 750 mg  Status:  Discontinued        750 mg Oral Daily 04/03/20 0906 04/04/20 1035        Jaslynn Thome M.D on 04/06/2020 at 11:21 AM  To page go to [email or web address]   [facility] -  Office  [phone]    See all Orders from today for further  details    Objective:   Vitals:   04/05/20 2008 04/05/20 2350 04/06/20 0446 04/06/20 0720  BP: 135/81 (!) 148/90 (!) 142/97 122/82  Pulse: 87 71 85 76  Resp: (!) 21 20 (!) 21 19  Temp: 97.8 F (36.6 C) 97.6 F (36.4 C) 97.8 F (36.6 C) 97.9 F (36.6 C)  TempSrc: Oral Axillary Axillary Oral  SpO2: 94% 95% 93% 95%  Weight:      Height:        Wt Readings from Last 3 Encounters:  04/02/20 104.3 kg  02/15/18 98 kg  05/25/17 106.1 kg    No intake or output data in the 24 hours ending 04/06/20 1121   Physical Exam  Awake Alert, Oriented X 3, No new F.N deficits, Normal affect Symmetrical Chest wall movement, Good air movement bilaterally, CTAB RRR. +ve B.Sounds, Abd Soft, No tenderness, No rebound - guarding or rigidity. No Cyanosis, Clubbing or edema, No new Rash or bruise       Data Review:    CBC Recent Labs  Lab 04/02/20 1119 04/03/20 0245 04/04/20 0055 04/05/20 0043 04/06/20 0148  WBC 19.9* 16.5* 18.9* 18.2* 17.6*  HGB 11.2* 10.9* 10.6* 11.1* 10.8*  HCT 36.8 36.1 35.2* 35.6* 35.1*  PLT 402* 356 346 393 414*  MCV 83.8 84.3 84.0 82.8 82.6  MCH 25.5* 25.5* 25.3* 25.8* 25.4*  MCHC 30.4 30.2 30.1 31.2 30.8  RDW 14.2 14.3 13.9 14.0 13.7  LYMPHSABS  --  1.9 1.3 1.4 2.1  MONOABS  --  1.3* 1.4* 0.6 0.6  EOSABS  --  0.0 0.0 0.0 0.0  BASOSABS  --  0.0 0.0 0.0 0.0    Recent Labs  Lab 04/02/20 1119 04/02/20 1848 04/03/20 0245 04/04/20 0055 04/05/20 0043 04/06/20 0148  NA 137  --  138 138 142 140  K 4.2  --  3.9 4.6 4.4 4.5  CL 103  --  102 104 107 105  CO2 21*  --  GLUCOSE 125*  --  124* 125* 149* 200*  BUN 8  --  CREATININE 0.82  --  0.85 0.87 0.68 0.63  CALCIUM 9.3  --  8.9 9.2 9.5 9.4  AST  --   --  14* 24 87* 38  ALT  --   --  20 30 109* 119*  ALKPHOS  --   --  50 58 81 71  BILITOT  --   --  0.8 0.4 0.3 0.6  ALBUMIN  --   --  2.9* 2.8* 2.8* 2.7*  MG  --   --   --  2.5* 2.5* 2.4  CRP  --   --  25.5* 26.8* 14.4* 5.4*   DDIMER  --  3.39* 3.38* 3.70* 4.49* 5.12*  PROCALCITON  --   --  0.23 0.11 0.21  --     ------------------------------------------------------------------------------------------------------------------ No results for input(s): CHOL, HDL, LDLCALC, TRIG, CHOLHDL, LDLDIRECT in the last 72 hours.  No results found for: HGBA1C ------------------------------------------------------------------------------------------------------------------ No results for input(s): TSH, T4TOTAL, T3FREE, THYROIDAB in the last 72 hours.  Invalid input(s): FREET3  Cardiac Enzymes No results for input(s): CKMB, TROPONINI, MYOGLOBIN in the last 168 hours.  Invalid input(s): CK ------------------------------------------------------------------------------------------------------------------ No results found for: BNP  Micro Results No results found for this or any previous visit (from the past 240 hour(s)).  Radiology Reports DG Chest 1 View  Result Date: 04/02/2020 CLINICAL DATA:  Shortness of breath, cough and low back pain. The patient tested positive for COVID-19 10 days ago. EXAM: CHEST  1 VIEW COMPARISON:  PA and lateral chest 03/15/2018. Single-view of the chest 03/23/2020. FINDINGS: Lung volumes are low. Streaky bibasilar airspace opacities are greater on the left and unchanged. No pneumothorax or pleural effusion. Heart size is normal. No acute or focal bony abnormality. IMPRESSION: Left greater than right streaky opacities are unchanged since the most recent examination and could be due to pneumonia or atelectasis. Electronically Signed   By: Drusilla Kanner M.D.   On: 04/02/2020 11:50   CT Angio Chest PE W and/or Wo Contrast  Result Date: 04/02/2020 CLINICAL DATA:  Chest and back pain since yesterday, COVID-19 positive 10 days ago EXAM: CT ANGIOGRAPHY CHEST WITH CONTRAST TECHNIQUE: Multidetector CT imaging of the chest was performed using the standard protocol during bolus administration of  intravenous contrast. Multiplanar CT image reconstructions and MIPs were obtained to evaluate the vascular anatomy. CONTRAST:  87mL OMNIPAQUE IOHEXOL 350 MG/ML SOLN COMPARISON:  04/02/2020 FINDINGS: Cardiovascular: This is a technically adequate evaluation of the pulmonary vasculature. There are bilateral segmental pulmonary emboli, greatest in the lower lobes. There is moderate clot burden, but no evidence of right heart strain. No pericardial effusion.  Thoracic aorta is unremarkable. Mediastinum/Nodes: No enlarged mediastinal, hilar, or axillary lymph nodes. Thyroid gland, trachea, and esophagus demonstrate no significant findings. Lungs/Pleura: Patchy bilateral perihilar ground-glass airspace disease consistent with multifocal pneumonia and given history of COVID-19. Areas of denser consolidation within the dependent lower lobes compatible with atelectasis or infarct. Trace right pleural effusion. No pneumothorax. Central airways are patent. Upper Abdomen: No acute abnormality. Musculoskeletal: No acute or destructive bony lesions. Reconstructed images demonstrate no additional findings. Review of the MIP images confirms the above findings. IMPRESSION: 1. Bilateral segmental pulmonary emboli, with no evidence of right heart strain. 2. Patchy bilateral perihilar ground-glass airspace disease consistent with multifocal pneumonia, compatible with history of COVID 19. 3. Trace right pleural effusion. 4. Bibasilar areas of consolidation consistent with atelectasis or developing pulmonary infarct. Critical Value/emergent results were called by telephone at the time of interpretation on 04/02/2020 at 9:09 pm to provider Gerhard Munch , who verbally acknowledged these results. Electronically Signed   By: Sharlet Salina M.D.   On: 04/02/2020 21:10   DG Chest Port 1 View  Result Date: 04/05/2020 CLINICAL DATA:  COVID-19 infection. Worsening hypoxia and shortness of breath. EXAM: PORTABLE CHEST 1 VIEW COMPARISON:  CT  and chest radiographs, 04/02/2020. FINDINGS: Lung base opacities are without change from the prior exam. No new lung abnormalities. Lung  volumes low. No visualized pleural effusion.  No pneumothorax. Cardiac silhouette is normal in size. No mediastinal or hilar masses. IMPRESSION: 1. Lung base opacities are unchanged from the most recent prior chest radiograph. The more subtle areas of ground-glass opacity noted on the prior chest CT are not resolved radiographically. No new abnormalities. Electronically Signed   By: Amie Portlandavid  Ormond M.D.   On: 04/05/2020 15:14   DG Chest Portable 1 View  Result Date: 03/23/2020 CLINICAL DATA:  COVID EXAM: PORTABLE CHEST 1 VIEW COMPARISON:  March 15, 2018 FINDINGS: The heart size and mediastinal contours are within normal limits. Hazy airspace opacity seen at the right lung base. The visualized skeletal structures are unremarkable. IMPRESSION: Hazy airspace opacity at the right lung base which likely due to infectious etiology. Electronically Signed   By: Jonna ClarkBindu  Avutu M.D.   On: 03/23/2020 20:08   VAS US LOWER EXTREMITY VENOUS (DVT)  Result Date: 04/03/2020  Lower Venous DVT Study Indications: Elevated ddimer.  Limitations: Body habitus and poor ultrasound/tissue interface. Comparison Study: no prior Performing Technologist: Blanch MediaMegan Riddle RVS  Examination Guidelines: A complete evaluation includes B-mode imaging, spectral Doppler, color Doppler, and power Doppler as needed of all accessible portions of each vessel. Bilateral testing is considered an integral part of a complete examination. Limited examinations for reoccurring indications may be performed as noted. The reflux portion of the exam is performed with the patient in reverse Trendelenburg.  +---------+---------------+---------+-----------+----------+--------------+ RIGHT    CompressibilityPhasicitySpontaneityPropertiesThrombus Aging +---------+---------------+---------+-----------+----------+--------------+  CFV      Full           Yes      Yes                                 +---------+---------------+---------+-----------+----------+--------------+ SFJ      Full                                                        +---------+---------------+---------+-----------+----------+--------------+ FV Prox  Full                                                        +---------+---------------+---------+-----------+----------+--------------+ FV Mid   Full                                                        +---------+---------------+---------+-----------+----------+--------------+ FV Distal               Yes      Yes                                 +---------+---------------+---------+-----------+----------+--------------+ PFV      Full                                                        +---------+---------------+---------+-----------+----------+--------------+  POP      Full           Yes      Yes                                 +---------+---------------+---------+-----------+----------+--------------+ PTV      Full                                                        +---------+---------------+---------+-----------+----------+--------------+ PERO     Full                                                        +---------+---------------+---------+-----------+----------+--------------+   +---------+---------------+---------+-----------+----------+--------------+ LEFT     CompressibilityPhasicitySpontaneityPropertiesThrombus Aging +---------+---------------+---------+-----------+----------+--------------+ CFV      Full           Yes      Yes                                 +---------+---------------+---------+-----------+----------+--------------+ SFJ      Full                                                        +---------+---------------+---------+-----------+----------+--------------+ FV Prox  Full                                                         +---------+---------------+---------+-----------+----------+--------------+ FV Mid                  Yes      Yes                                 +---------+---------------+---------+-----------+----------+--------------+ FV Distal               Yes      Yes                                 +---------+---------------+---------+-----------+----------+--------------+ PFV      Full                                                        +---------+---------------+---------+-----------+----------+--------------+ POP      Full           Yes      Yes                                 +---------+---------------+---------+-----------+----------+--------------+  PTV      Full                                                        +---------+---------------+---------+-----------+----------+--------------+ PERO     Full                                                        +---------+---------------+---------+-----------+----------+--------------+     Summary: BILATERAL: - No evidence of deep vein thrombosis seen in the lower extremities, bilaterally. - No evidence of superficial venous thrombosis in the lower extremities, bilaterally. -No evidence of popliteal cyst, bilaterally.   *See table(s) above for measurements and observations. Electronically signed by Waverly Ferrari MD on 04/03/2020 at 7:58:25 PM.    Final

## 2020-04-07 LAB — COMPREHENSIVE METABOLIC PANEL
ALT: 96 U/L — ABNORMAL HIGH (ref 0–44)
AST: 21 U/L (ref 15–41)
Albumin: 2.7 g/dL — ABNORMAL LOW (ref 3.5–5.0)
Alkaline Phosphatase: 71 U/L (ref 38–126)
Anion gap: 9 (ref 5–15)
BUN: 14 mg/dL (ref 6–20)
CO2: 24 mmol/L (ref 22–32)
Calcium: 9 mg/dL (ref 8.9–10.3)
Chloride: 104 mmol/L (ref 98–111)
Creatinine, Ser: 0.71 mg/dL (ref 0.44–1.00)
GFR, Estimated: >60 mL/min (ref >60)
Glucose, Bld: 324 mg/dL — ABNORMAL HIGH (ref 70–99)
Potassium: 4.7 mmol/L (ref 3.5–5.1)
Sodium: 137 mmol/L (ref 135–145)
Total Bilirubin: 0.3 mg/dL (ref 0.3–1.2)
Total Protein: 6.3 g/dL — ABNORMAL LOW (ref 6.5–8.1)

## 2020-04-07 LAB — CBC WITH DIFFERENTIAL/PLATELET
Abs Immature Granulocytes: 0.7 10*3/uL — ABNORMAL HIGH (ref 0.00–0.07)
Basophils Absolute: 0.1 10*3/uL (ref 0.0–0.1)
Basophils Relative: 0 %
Eosinophils Absolute: 0 10*3/uL (ref 0.0–0.5)
Eosinophils Relative: 0 %
HCT: 35 % — ABNORMAL LOW (ref 36.0–46.0)
Hemoglobin: 10.8 g/dL — ABNORMAL LOW (ref 12.0–15.0)
Immature Granulocytes: 4 %
Lymphocytes Relative: 12 %
Lymphs Abs: 2.2 10*3/uL (ref 0.7–4.0)
MCH: 25.6 pg — ABNORMAL LOW (ref 26.0–34.0)
MCHC: 30.9 g/dL (ref 30.0–36.0)
MCV: 82.9 fL (ref 80.0–100.0)
Monocytes Absolute: 0.8 10*3/uL (ref 0.1–1.0)
Monocytes Relative: 4 %
Neutro Abs: 14.1 10*3/uL — ABNORMAL HIGH (ref 1.7–7.7)
Neutrophils Relative %: 80 %
Platelets: 404 10*3/uL — ABNORMAL HIGH (ref 150–400)
RBC: 4.22 MIL/uL (ref 3.87–5.11)
RDW: 13.6 % (ref 11.5–15.5)
WBC: 17.9 10*3/uL — ABNORMAL HIGH (ref 4.0–10.5)
nRBC: 0.1 % (ref 0.0–0.2)

## 2020-04-07 LAB — GLUCOSE, CAPILLARY
Glucose-Capillary: 251 mg/dL — ABNORMAL HIGH (ref 70–99)
Glucose-Capillary: 267 mg/dL — ABNORMAL HIGH (ref 70–99)
Glucose-Capillary: 334 mg/dL — ABNORMAL HIGH (ref 70–99)
Glucose-Capillary: 359 mg/dL — ABNORMAL HIGH (ref 70–99)

## 2020-04-07 LAB — MAGNESIUM: Magnesium: 2.3 mg/dL (ref 1.7–2.4)

## 2020-04-07 LAB — HEMOGLOBIN A1C
Hgb A1c MFr Bld: 6.4 % — ABNORMAL HIGH (ref 4.8–5.6)
Mean Plasma Glucose: 136.98 mg/dL

## 2020-04-07 LAB — C-REACTIVE PROTEIN: CRP: 2.1 mg/dL — ABNORMAL HIGH (ref <1.0)

## 2020-04-07 LAB — D-DIMER, QUANTITATIVE: D-Dimer, Quant: 2.49 ug/mL-FEU — ABNORMAL HIGH (ref 0.00–0.50)

## 2020-04-07 MED ORDER — METHYLPREDNISOLONE SODIUM SUCC 40 MG IJ SOLR
40.0000 mg | Freq: Three times a day (TID) | INTRAMUSCULAR | Status: DC
  Administered 2020-04-07 – 2020-04-09 (×6): 40 mg via INTRAVENOUS
  Filled 2020-04-07 (×6): qty 1

## 2020-04-07 MED ORDER — INSULIN DETEMIR 100 UNIT/ML ~~LOC~~ SOLN
8.0000 [IU] | Freq: Every day | SUBCUTANEOUS | Status: DC
  Administered 2020-04-07: 8 [IU] via SUBCUTANEOUS
  Filled 2020-04-07 (×2): qty 0.08

## 2020-04-07 NOTE — Progress Notes (Signed)
PROGRESS NOTE                                                                                                                                                                                                             Patient Demographics:    Faith [name], is a 29 y.o. female, DOB - 29-Aug-1990, UJW:[id number]  Outpatient Primary MD for the patient is Patient, No Pcp Per    LOS - 5  Admit date - 04/02/2020    Chief Complaint  Patient presents with  . Chest Pain  . Back Pain       Brief Narrative (HPI from H&P)  - Faith [name] is a 29 y.o. female with medical history significant of essential hypertension not on any medication, morbid obesity, who was diagnosed with COVID-19 pneumonia on 03/23/20 -came to the hospital with chest pain.  In the ER was diagnosed with subsegmental PE along with COVID-19 pneumonia on CT chest and admitted to the hospital.   Subjective:    Faith [name] today ports cough, dyspnea mainly with activity, no fever, no chest pain     Assessment  & Plan :     Acute hypoxic respiratory failure due to acute bilateral subsegmental PE with acute COVID-19 pneumonia  - she is unfortunately not vaccinated. - initial COVID-19 pneumonia diagnosis on 03/23/2020.  PE likely due to inflammation from COVID-19, on full dose Lovenox, will require total of 6  months of anticoagulation, currently transitioned to Eliquis. -Continue with IV steroids.  Decadron changed to Solu-Medrol. -Continue with IV remdesivir -started on baricitinib 12/2.  . -Is on 2 L at rest, but she is quite symptomatic with minimal activity, she is dyspneic, today she was saturating 82% on 8 L with minimal activity, will obtain 2D echo.   -Venous Dopplers negative for DVT. -She was encouraged with incentive spirometry and flutter valve, out of bed to chair. -she is on antibiotics given elevated procalcitonin on  admission, will treat for bacterial pneumonia as well, changed levofloxacin to azithromycin/Rocephin.   SpO2: 96 % O2 Flow Rate (L/min): 4 L/min  Recent Labs  Lab 04/03/20 0245 04/04/20 0055 04/05/20 0043 04/06/20 0148 04/07/20 0343  WBC 16.5* 18.9* 18.2* 17.6* 17.9*  HGB 10.9* 10.6* 11.1* 10.8* 10.8*  HCT 36.1 35.2* 35.6* 35.1* 35.0*  PLT 356 346 393 414* 404*  CRP 25.5* 26.8* 14.4* 5.4* 2.1*  DDIMER 3.38* 3.70* 4.49* 5.12* 2.49*  PROCALCITON 0.23 0.11 0.21  --   --   AST 14* 24 87* 38 21  ALT 20 30 109* 119* 96*  ALKPHOS 50 58 81 71 71  BILITOT 0.8 0.4 0.3 0.6 0.3  ALBUMIN 2.9* 2.8* 2.8* 2.7* 2.7*     Morbid obesity.  BMI 43.   - Follow with PCP for weight loss.  Clinical dehydration.  -  Hydrate with IV fluids  I-STAT pregnancy test negative   Condition - Fair  Family Communication  : I have discussed with patient, all her questions were answered in details .  father updated by phone 04/05/2020  Code Status : Full  Consults  : None  Procedures  :    CT chest.  Bilateral subsegmental PE and consolidation, bilateral groundglass opacities  PUD Prophylaxis : none  Disposition Plan  :    Status is: Inpatient  Remains inpatient appropriate because:IV treatments appropriate due to intensity of illness or inability to take PO   Dispo: The patient is from: Home              Anticipated d/c is to: Home              Anticipated d/c date is: 3 days              Patient currently is not medically stable to d/c.      DVT Prophylaxis  :  Lovenox   Lab Results  Component Value Date   PLT 404 (H) 04/07/2020    Diet :  Diet Order            Diet regular Room service appropriate? Yes; Fluid consistency: Thin  Diet effective now                  Inpatient Medications  Scheduled Meds: . apixaban  10 mg Oral BID   Followed by  . [START ON 04/12/2020] apixaban  5 mg Oral BID  . baricitinib  4 mg Oral Daily  . influenza vac split quadrivalent PF  0.5  mL Intramuscular Tomorrow-1000  . insulin aspart  0-15 Units Subcutaneous TID WC  . insulin aspart  0-5 Units Subcutaneous QHS  . insulin detemir  8 Units Subcutaneous Daily  . Ipratropium-Albuterol  1 puff Inhalation Q6H  . methylPREDNISolone (SOLU-MEDROL) injection  40 mg Intravenous Q8H  . multivitamin with minerals  1 tablet Oral Daily  . pantoprazole  40 mg Oral Daily   Continuous Infusions: . remdesivir 100 mg in NS 100 mL 100 mg (04/07/20 0856)   PRN Meds:.acetaminophen, chlorpheniramine-HYDROcodone, guaiFENesin-dextromethorphan, morphine injection, [DISCONTINUED] ondansetron **OR** ondansetron (ZOFRAN) IV, traMADol  Antibiotics  :    Anti-infectives (From admission, onward)   Start     Dose/Rate Route Frequency Ordered Stop   04/05/20 1000  remdesivir 100 mg in sodium chloride 0.9 % 100 mL IVPB       "Followed by" Linked Group Details   100 mg 200 mL/hr over 30 Minutes Intravenous Daily 04/04/20 1034 04/09/20 0959   04/04/20 1130  remdesivir 200 mg in sodium chloride 0.9% 250 mL IVPB       "Followed by" Linked Group Details   200 mg 580 mL/hr over 30 Minutes Intravenous Once 04/04/20 1034 04/04/20 1459   04/04/20 1130  cefTRIAXone (ROCEPHIN) 1 g in sodium chloride 0.9 % 100 mL IVPB        1 g 200 mL/hr over  30 Minutes Intravenous Every 24 hours 04/04/20 1034 04/07/20 1239   04/04/20 1130  azithromycin (ZITHROMAX) tablet 500 mg        500 mg Oral Daily 04/04/20 1034 04/07/20 0851   04/03/20 1000  levofloxacin (LEVAQUIN) tablet 750 mg  Status:  Discontinued        750 mg Oral Daily 04/03/20 0906 04/04/20 1035        Kaly [name] M.D on 04/07/2020 at 12:48 PM  To page go to [email or web address]   [facility] -  Office  [phone]    See all Orders from today for further details    Objective:   Vitals:   04/06/20 2357 04/07/20 0400 04/07/20 0747 04/07/20 1147  BP: (!) 142/79 134/76 124/71 (!) 132/99  Pulse: 75 71 60 66  Resp: (!) 24 20 19 18   Temp: 97.8  F (36.6 C) 98.3 F (36.8 C) 98.2 F (36.8 C) 97.9 F (36.6 C)  TempSrc: Oral Oral Oral Oral  SpO2: 93% 97% 93% 96%  Weight:      Height:        Wt Readings from Last 3 Encounters:  04/02/20 104.3 kg  02/15/18 98 kg  05/25/17 106.1 kg     Intake/Output Summary (Last 24 hours) at 04/07/2020 1248 Last data filed at 04/07/2020 0406 Gross per 24 hour  Intake 600 ml  Output --  Net 600 ml     Physical Exam  Awake Alert, Oriented X 3, No new F.N deficits, Normal affect Symmetrical Chest wall movement, Good air movement bilaterally, scattered rales RRR +ve B.Sounds, Abd Soft, No tenderness, No rebound - guarding or rigidity. No Cyanosis, Clubbing or edema, No new Rash or bruise       Data Review:    CBC Recent Labs  Lab 04/03/20 0245 04/04/20 0055 04/05/20 0043 04/06/20 0148 04/07/20 0343  WBC 16.5* 18.9* 18.2* 17.6* 17.9*  HGB 10.9* 10.6* 11.1* 10.8* 10.8*  HCT 36.1 35.2* 35.6* 35.1* 35.0*  PLT 356 346 393 414* 404*  MCV 84.3 84.0 82.8 82.6 82.9  MCH 25.5* 25.3* 25.8* 25.4* 25.6*  MCHC 30.2 30.1 31.2 30.8 30.9  RDW 14.3 13.9 14.0 13.7 13.6  LYMPHSABS 1.9 1.3 1.4 2.1 2.2  MONOABS 1.3* 1.4* 0.6 0.6 0.8  EOSABS 0.0 0.0 0.0 0.0 0.0  BASOSABS 0.0 0.0 0.0 0.0 0.1    Recent Labs  Lab 04/03/20 0245 04/04/20 0055 04/05/20 0043 04/06/20 0148 04/07/20 0343  NA 138 138 142 140 137  K 3.9 4.6 4.4 4.5 4.7  CL 102 104 107 105 104  CO2 23 23 22 23 24   GLUCOSE 124* 125* 149* 200* 324*  BUN 7 9 7 10 14   CREATININE 0.85 0.87 0.68 0.63 0.71  CALCIUM 8.9 9.2 9.5 9.4 9.0  AST 14* 24 87* 38 21  ALT 20 30 109* 119* 96*  ALKPHOS 50 58 81 71 71  BILITOT 0.8 0.4 0.3 0.6 0.3  ALBUMIN 2.9* 2.8* 2.8* 2.7* 2.7*  MG  --  2.5* 2.5* 2.4 2.3  CRP 25.5* 26.8* 14.4* 5.4* 2.1*  DDIMER 3.38* 3.70* 4.49* 5.12* 2.49*  PROCALCITON 0.23 0.11 0.21  --   --   HGBA1C  --   --   --   --  6.4*     ------------------------------------------------------------------------------------------------------------------ No results for input(s): CHOL, HDL, LDLCALC, TRIG, CHOLHDL, LDLDIRECT in the last 72 hours.  Lab Results  Component Value Date   HGBA1C 6.4 (H) 04/07/2020   ------------------------------------------------------------------------------------------------------------------ No results for input(s): TSH, T4TOTAL,  T3FREE, THYROIDAB in the last 72 hours.  Invalid input(s): FREET3  Cardiac Enzymes No results for input(s): CKMB, TROPONINI, MYOGLOBIN in the last 168 hours.  Invalid input(s): CK ------------------------------------------------------------------------------------------------------------------ No results found for: BNP  Micro Results No results found for this or any previous visit (from the past 240 hour(s)).  Radiology Reports DG Chest 1 View  Result Date: 04/02/2020 CLINICAL DATA:  Shortness of breath, cough and low back pain. The patient tested positive for COVID-19 10 days ago. EXAM: CHEST  1 VIEW COMPARISON:  PA and lateral chest 03/15/2018. Single-view of the chest 03/23/2020. FINDINGS: Lung volumes are low. Streaky bibasilar airspace opacities are greater on the left and unchanged. No pneumothorax or pleural effusion. Heart size is normal. No acute or focal bony abnormality. IMPRESSION: Left greater than right streaky opacities are unchanged since the most recent examination and could be due to pneumonia or atelectasis. Electronically Signed   By: Drusilla Kanner M.D.   On: 04/02/2020 11:50   CT Angio Chest PE W and/or Wo Contrast  Result Date: 04/02/2020 CLINICAL DATA:  Chest and back pain since yesterday, COVID-19 positive 10 days ago EXAM: CT ANGIOGRAPHY CHEST WITH CONTRAST TECHNIQUE: Multidetector CT imaging of the chest was performed using the standard protocol during bolus administration of intravenous contrast. Multiplanar CT image reconstructions  and MIPs were obtained to evaluate the vascular anatomy. CONTRAST:  16mL OMNIPAQUE IOHEXOL 350 MG/ML SOLN COMPARISON:  04/02/2020 FINDINGS: Cardiovascular: This is a technically adequate evaluation of the pulmonary vasculature. There are bilateral segmental pulmonary emboli, greatest in the lower lobes. There is moderate clot burden, but no evidence of right heart strain. No pericardial effusion.  Thoracic aorta is unremarkable. Mediastinum/Nodes: No enlarged mediastinal, hilar, or axillary lymph nodes. Thyroid gland, trachea, and esophagus demonstrate no significant findings. Lungs/Pleura: Patchy bilateral perihilar ground-glass airspace disease consistent with multifocal pneumonia and given history of COVID-19. Areas of denser consolidation within the dependent lower lobes compatible with atelectasis or infarct. Trace right pleural effusion. No pneumothorax. Central airways are patent. Upper Abdomen: No acute abnormality. Musculoskeletal: No acute or destructive bony lesions. Reconstructed images demonstrate no additional findings. Review of the MIP images confirms the above findings. IMPRESSION: 1. Bilateral segmental pulmonary emboli, with no evidence of right heart strain. 2. Patchy bilateral perihilar ground-glass airspace disease consistent with multifocal pneumonia, compatible with history of COVID 19. 3. Trace right pleural effusion. 4. Bibasilar areas of consolidation consistent with atelectasis or developing pulmonary infarct. Critical Value/emergent results were called by telephone at the time of interpretation on 04/02/2020 at 9:09 pm to provider Gerhard Munch , who verbally acknowledged these results. Electronically Signed   By: Sharlet Salina M.D.   On: 04/02/2020 21:10   DG Chest Port 1 View  Result Date: 04/05/2020 CLINICAL DATA:  COVID-19 infection. Worsening hypoxia and shortness of breath. EXAM: PORTABLE CHEST 1 VIEW COMPARISON:  CT and chest radiographs, 04/02/2020. FINDINGS: Lung base  opacities are without change from the prior exam. No new lung abnormalities. Lung volumes low. No visualized pleural effusion.  No pneumothorax. Cardiac silhouette is normal in size. No mediastinal or hilar masses. IMPRESSION: 1. Lung base opacities are unchanged from the most recent prior chest radiograph. The more subtle areas of ground-glass opacity noted on the prior chest CT are not resolved radiographically. No new abnormalities. Electronically Signed   By: Amie Portland M.D.   On: 04/05/2020 15:14   DG Chest Portable 1 View  Result Date: 03/23/2020 CLINICAL DATA:  COVID EXAM: PORTABLE CHEST 1  VIEW COMPARISON:  March 15, 2018 FINDINGS: The heart size and mediastinal contours are within normal limits. Hazy airspace opacity seen at the right lung base. The visualized skeletal structures are unremarkable. IMPRESSION: Hazy airspace opacity at the right lung base which likely due to infectious etiology. Electronically Signed   By: Jonna Clark M.D.   On: 03/23/2020 20:08   VAS [place] LOWER EXTREMITY VENOUS (DVT)  Result Date: 04/03/2020  Lower Venous DVT Study Indications: Elevated ddimer.  Limitations: Body habitus and poor ultrasound/tissue interface. Comparison Study: no prior Performing Technologist: Blanch Media RVS  Examination Guidelines: A complete evaluation includes B-mode imaging, spectral Doppler, color Doppler, and power Doppler as needed of all accessible portions of each vessel. Bilateral testing is considered an integral part of a complete examination. Limited examinations for reoccurring indications may be performed as noted. The reflux portion of the exam is performed with the patient in reverse Trendelenburg.  +---------+---------------+---------+-----------+----------+--------------+ RIGHT    CompressibilityPhasicitySpontaneityPropertiesThrombus Aging +---------+---------------+---------+-----------+----------+--------------+ CFV      Full           Yes      Yes                                  +---------+---------------+---------+-----------+----------+--------------+ SFJ      Full                                                        +---------+---------------+---------+-----------+----------+--------------+ FV Prox  Full                                                        +---------+---------------+---------+-----------+----------+--------------+ FV Mid   Full                                                        +---------+---------------+---------+-----------+----------+--------------+ FV Distal               Yes      Yes                                 +---------+---------------+---------+-----------+----------+--------------+ PFV      Full                                                        +---------+---------------+---------+-----------+----------+--------------+ POP      Full           Yes      Yes                                 +---------+---------------+---------+-----------+----------+--------------+ PTV      Full                                                        +---------+---------------+---------+-----------+----------+--------------+  PERO     Full                                                        +---------+---------------+---------+-----------+----------+--------------+   +---------+---------------+---------+-----------+----------+--------------+ LEFT     CompressibilityPhasicitySpontaneityPropertiesThrombus Aging +---------+---------------+---------+-----------+----------+--------------+ CFV      Full           Yes      Yes                                 +---------+---------------+---------+-----------+----------+--------------+ SFJ      Full                                                        +---------+---------------+---------+-----------+----------+--------------+ FV Prox  Full                                                         +---------+---------------+---------+-----------+----------+--------------+ FV Mid                  Yes      Yes                                 +---------+---------------+---------+-----------+----------+--------------+ FV Distal               Yes      Yes                                 +---------+---------------+---------+-----------+----------+--------------+ PFV      Full                                                        +---------+---------------+---------+-----------+----------+--------------+ POP      Full           Yes      Yes                                 +---------+---------------+---------+-----------+----------+--------------+ PTV      Full                                                        +---------+---------------+---------+-----------+----------+--------------+ PERO     Full                                                        +---------+---------------+---------+-----------+----------+--------------+  Summary: BILATERAL: - No evidence of deep vein thrombosis seen in the lower extremities, bilaterally. - No evidence of superficial venous thrombosis in the lower extremities, bilaterally. -No evidence of popliteal cyst, bilaterally.   *See table(s) above for measurements and observations. Electronically signed by Waverly Ferrari MD on 04/03/2020 at 7:58:25 PM.    Final

## 2020-04-08 ENCOUNTER — Other Ambulatory Visit (HOSPITAL_COMMUNITY): Payer: 59

## 2020-04-08 LAB — COMPREHENSIVE METABOLIC PANEL
ALT: 76 U/L — ABNORMAL HIGH (ref 0–44)
AST: 19 U/L (ref 15–41)
Albumin: 2.7 g/dL — ABNORMAL LOW (ref 3.5–5.0)
Alkaline Phosphatase: 83 U/L (ref 38–126)
Anion gap: 9 (ref 5–15)
BUN: 13 mg/dL (ref 6–20)
CO2: 25 mmol/L (ref 22–32)
Calcium: 9 mg/dL (ref 8.9–10.3)
Chloride: 102 mmol/L (ref 98–111)
Creatinine, Ser: 0.71 mg/dL (ref 0.44–1.00)
GFR, Estimated: >60 mL/min (ref >60)
Glucose, Bld: 301 mg/dL — ABNORMAL HIGH (ref 70–99)
Potassium: 4.4 mmol/L (ref 3.5–5.1)
Sodium: 136 mmol/L (ref 135–145)
Total Bilirubin: <0.1 mg/dL — ABNORMAL LOW (ref 0.3–1.2)
Total Protein: 6.4 g/dL — ABNORMAL LOW (ref 6.5–8.1)

## 2020-04-08 LAB — GLUCOSE, CAPILLARY
Glucose-Capillary: 206 mg/dL — ABNORMAL HIGH (ref 70–99)
Glucose-Capillary: 226 mg/dL — ABNORMAL HIGH (ref 70–99)
Glucose-Capillary: 326 mg/dL — ABNORMAL HIGH (ref 70–99)
Glucose-Capillary: 299 mg/dL — ABNORMAL HIGH (ref 70–99)

## 2020-04-08 MED ORDER — INSULIN DETEMIR 100 UNIT/ML ~~LOC~~ SOLN
15.0000 [IU] | Freq: Every day | SUBCUTANEOUS | Status: DC
  Administered 2020-04-08: 15 [IU] via SUBCUTANEOUS
  Filled 2020-04-08 (×2): qty 0.15

## 2020-04-08 MED ORDER — IPRATROPIUM-ALBUTEROL 20-100 MCG/ACT IN AERS
1.0000 | INHALATION_SPRAY | Freq: Two times a day (BID) | RESPIRATORY_TRACT | Status: DC
  Administered 2020-04-08 – 2020-04-11 (×7): 1 via RESPIRATORY_TRACT

## 2020-04-08 NOTE — Progress Notes (Signed)
PROGRESS NOTE                                                                                                                                                                                                             Patient Demographics:    Faith [name], is a 29 y.o. female, DOB - [birth year], ZOX:[id number]  Outpatient Primary MD for the patient is Patient, No Pcp Per    LOS - 6  Admit date - 04/02/2020    Chief Complaint  Patient presents with  . Chest Pain  . Back Pain       Brief Narrative (HPI from H&P)  - Faith [name] is a 29 y.o. female with medical history significant of essential hypertension not on any medication, morbid obesity, who was diagnosed with COVID-19 pneumonia on 03/23/20 -came to the hospital with chest pain.  In the ER was diagnosed with subsegmental PE along with COVID-19 pneumonia on CT chest and admitted to the hospital.   Subjective:    Faith [name] today she had some increased oxygen requirement while sleeping, but she is back to 2 L this morning, she reports generalized weakness, fatigue, reports dyspnea at baseline, but mainly with activity .   Assessment  & Plan :     Acute hypoxic respiratory failure due to acute bilateral subsegmental PE with acute COVID-19 pneumonia  - she is unfortunately not vaccinated. - initial COVID-19 pneumonia diagnosis on 03/23/2020.  PE likely due to inflammation from COVID-19, on full dose Lovenox, will require total of 6  months of anticoagulation, currently transitioned to Eliquis. -Continue with IV steroids.  Decadron changed to Solu-Medrol. -Treated  with IV remdesivir -started on baricitinib 12/2.  . -She is on 2 L at rest, but she is with significant oxygen requirement with activity, she is requiring up to 8 L with activity, and quite dyspneic, repeat 2D echo is pending. -Venous Dopplers negative for DVT. -She was encouraged  with incentive spirometry and flutter valve, out of bed to chair. -she is on antibiotics given elevated procalcitonin on admission, will treat for bacterial pneumonia as well, changed levofloxacin to azithromycin/Rocephin.   SpO2: 96 % O2 Flow Rate (L/min): 2 L/min  Recent Labs  Lab 04/03/20 0245 04/03/20 0245 04/04/20 0055 04/05/20 0043 04/06/20 0148 04/07/20 0343 04/08/20 0424  WBC 16.5*  --  18.9*  18.2* 17.6* 17.9*  --   HGB 10.9*  --  10.6* 11.1* 10.8* 10.8*  --   HCT 36.1  --  35.2* 35.6* 35.1* 35.0*  --   PLT 356  --  346 393 414* 404*  --   CRP 25.5*  --  26.8* 14.4* 5.4* 2.1*  --   DDIMER 3.38*  --  3.70* 4.49* 5.12* 2.49*  --   PROCALCITON 0.23  --  0.11 0.21  --   --   --   AST 14*   < > 24 87* 38 21 19  ALT 20   < > 30 109* 119* 96* 76*  ALKPHOS 50   < > 58 81 71 71 83  BILITOT 0.8   < > 0.4 0.3 0.6 0.3 <0.1*  ALBUMIN 2.9*   < > 2.8* 2.8* 2.7* 2.7* 2.7*   < > = values in this interval not displayed.     Morbid obesity.  BMI 43.   - Follow with PCP for weight loss.  Clinical dehydration.  -  Hydrate with IV fluids  Hyperglycemia -He is 6.4 -Related to Covid and steroids, remains elevated, will increase Levemir to 15 units.  I-STAT pregnancy test negative   Condition - Fair  Family Communication  : I have discussed with patient, all her questions were answered in details .  father updated by phone 04/05/2020  Code Status : Full  Consults  : None  Procedures  :    CT chest.  Bilateral subsegmental PE and consolidation, bilateral groundglass opacities  PUD Prophylaxis : none  Disposition Plan  :    Status is: Inpatient  Remains inpatient appropriate because:IV treatments appropriate due to intensity of illness or inability to take PO   Dispo: The patient is from: Home              Anticipated d/c is to: Home              Anticipated d/c date is: 3 days              Patient currently is not medically stable to d/c.      DVT Prophylaxis  :   Lovenox >> Eliquis  Lab Results  Component Value Date   PLT 404 (H) 04/07/2020    Diet :  Diet Order            Diet regular Room service appropriate? Yes; Fluid consistency: Thin  Diet effective now                  Inpatient Medications  Scheduled Meds: . apixaban  10 mg Oral BID   Followed by  . [START ON 04/12/2020] apixaban  5 mg Oral BID  . baricitinib  4 mg Oral Daily  . influenza vac split quadrivalent PF  0.5 mL Intramuscular Tomorrow-1000  . insulin aspart  0-15 Units Subcutaneous TID WC  . insulin aspart  0-5 Units Subcutaneous QHS  . insulin detemir  15 Units Subcutaneous Daily  . Ipratropium-Albuterol  1 puff Inhalation BID  . methylPREDNISolone (SOLU-MEDROL) injection  40 mg Intravenous Q8H  . multivitamin with minerals  1 tablet Oral Daily  . pantoprazole  40 mg Oral Daily   Continuous Infusions:  PRN Meds:.acetaminophen, chlorpheniramine-HYDROcodone, guaiFENesin-dextromethorphan, morphine injection, [DISCONTINUED] ondansetron **OR** ondansetron (ZOFRAN) IV, traMADol  Antibiotics  :    Anti-infectives (From admission, onward)   Start     Dose/Rate Route Frequency Ordered Stop   04/05/20 1000  remdesivir  100 mg in sodium chloride 0.9 % 100 mL IVPB       "Followed by" Linked Group Details   100 mg 200 mL/hr over 30 Minutes Intravenous Daily 04/04/20 1034 04/08/20 0920   04/04/20 1130  remdesivir 200 mg in sodium chloride 0.9% 250 mL IVPB       "Followed by" Linked Group Details   200 mg 580 mL/hr over 30 Minutes Intravenous Once 04/04/20 1034 04/04/20 1459   04/04/20 1130  cefTRIAXone (ROCEPHIN) 1 g in sodium chloride 0.9 % 100 mL IVPB        1 g 200 mL/hr over 30 Minutes Intravenous Every 24 hours 04/04/20 1034 04/07/20 1239   04/04/20 1130  azithromycin (ZITHROMAX) tablet 500 mg        500 mg Oral Daily 04/04/20 1034 04/07/20 0851   04/03/20 1000  levofloxacin (LEVAQUIN) tablet 750 mg  Status:  Discontinued        750 mg Oral Daily 04/03/20 0906  04/04/20 1035        Tenzin Pavon M.D on 04/08/2020 at 11:43 AM  To page go to [email or web address]   [facility] -  Office  [phone]    See all Orders from today for further details    Objective:   Vitals:   04/07/20 2224 04/08/20 0020 04/08/20 0536 04/08/20 0802  BP: (!) 150/91 131/78 (!) 141/85 (!) 136/93  Pulse: 75 67 61 62  Resp: 18 16 17 17   Temp: 98.3 F (36.8 C) 98.3 F (36.8 C) 98.2 F (36.8 C) 98.4 F (36.9 C)  TempSrc: Oral Oral Oral Oral  SpO2: 98% (!) 85% 99% 96%  Weight:      Height:        Wt Readings from Last 3 Encounters:  04/02/20 104.3 kg  02/15/18 98 kg  05/25/17 106.1 kg     Intake/Output Summary (Last 24 hours) at 04/08/2020 1143 Last data filed at 04/08/2020 0900 Gross per 24 hour  Intake 480 ml  Output --  Net 480 ml     Physical Exam  Awake Alert, Oriented X 3, No new F.N deficits, Normal affect Symmetrical Chest wall movement, Good air movement bilaterally, no wheezing or crackles RRR,No Gallops, +ve B.Sounds, Abd Soft, No tenderness, No rebound - guarding or rigidity. No Cyanosis, Clubbing or edema, No new Rash or bruise        Data Review:    CBC Recent Labs  Lab 04/03/20 0245 04/04/20 0055 04/05/20 0043 04/06/20 0148 04/07/20 0343  WBC 16.5* 18.9* 18.2* 17.6* 17.9*  HGB 10.9* 10.6* 11.1* 10.8* 10.8*  HCT 36.1 35.2* 35.6* 35.1* 35.0*  PLT 356 346 393 414* 404*  MCV 84.3 84.0 82.8 82.6 82.9  MCH 25.5* 25.3* 25.8* 25.4* 25.6*  MCHC 30.2 30.1 31.2 30.8 30.9  RDW 14.3 13.9 14.0 13.7 13.6  LYMPHSABS 1.9 1.3 1.4 2.1 2.2  MONOABS 1.3* 1.4* 0.6 0.6 0.8  EOSABS 0.0 0.0 0.0 0.0 0.0  BASOSABS 0.0 0.0 0.0 0.0 0.1    Recent Labs  Lab 04/03/20 0245 04/03/20 0245 04/04/20 0055 04/05/20 0043 04/06/20 0148 04/07/20 0343 04/08/20 0424  NA 138   < > 138 142 140 137 136  K 3.9   < > 4.6 4.4 4.5 4.7 4.4  CL 102   < > 104 107 105 104 102  CO2 23   < > 23 22 23 24 25   GLUCOSE 124*   < > 125* 149* 200* 324*  301*  BUN 7   < >  CREATININE 0.85   < > 0.87 0.68 0.63 0.71 0.71  CALCIUM 8.9   < > 9.2 9.5 9.4 9.0 9.0  AST 14*   < > 24 87* 38 21 19  ALT 20   < > 30 109* 119* 96* 76*  ALKPHOS 50   < > 58 81 71 71 83  BILITOT 0.8   < > 0.4 0.3 0.6 0.3 <0.1*  ALBUMIN 2.9*   < > 2.8* 2.8* 2.7* 2.7* 2.7*  MG  --   --  2.5* 2.5* 2.4 2.3  --   CRP 25.5*  --  26.8* 14.4* 5.4* 2.1*  --   DDIMER 3.38*  --  3.70* 4.49* 5.12* 2.49*  --   PROCALCITON 0.23  --  0.11 0.21  --   --   --   HGBA1C  --   --   --   --   --  6.4*  --    < > = values in this interval not displayed.    ------------------------------------------------------------------------------------------------------------------ No results for input(s): CHOL, HDL, LDLCALC, TRIG, CHOLHDL, LDLDIRECT in the last 72 hours.  Lab Results  Component Value Date   HGBA1C 6.4 (H) 04/07/2020   ------------------------------------------------------------------------------------------------------------------ No results for input(s): TSH, T4TOTAL, T3FREE, THYROIDAB in the last 72 hours.  Invalid input(s): FREET3  Cardiac Enzymes No results for input(s): CKMB, TROPONINI, MYOGLOBIN in the last 168 hours.  Invalid input(s): CK ------------------------------------------------------------------------------------------------------------------ No results found for: BNP  Micro Results No results found for this or any previous visit (from the past 240 hour(s)).  Radiology Reports DG Chest 1 View  Result Date: 04/02/2020 CLINICAL DATA:  Shortness of breath, cough and low back pain. The patient tested positive for COVID-19 10 days ago. EXAM: CHEST  1 VIEW COMPARISON:  PA and lateral chest 03/15/2018. Single-view of the chest 03/23/2020. FINDINGS: Lung volumes are low. Streaky bibasilar airspace opacities are greater on the left and unchanged. No pneumothorax or pleural effusion. Heart size is normal. No acute or focal bony abnormality. IMPRESSION:  Left greater than right streaky opacities are unchanged since the most recent examination and could be due to pneumonia or atelectasis. Electronically Signed   By: Drusilla Kanner M.D.   On: 04/02/2020 11:50   CT Angio Chest PE W and/or Wo Contrast  Result Date: 04/02/2020 CLINICAL DATA:  Chest and back pain since yesterday, COVID-19 positive 10 days ago EXAM: CT ANGIOGRAPHY CHEST WITH CONTRAST TECHNIQUE: Multidetector CT imaging of the chest was performed using the standard protocol during bolus administration of intravenous contrast. Multiplanar CT image reconstructions and MIPs were obtained to evaluate the vascular anatomy. CONTRAST:  75mL OMNIPAQUE IOHEXOL 350 MG/ML SOLN COMPARISON:  04/02/2020 FINDINGS: Cardiovascular: This is a technically adequate evaluation of the pulmonary vasculature. There are bilateral segmental pulmonary emboli, greatest in the lower lobes. There is moderate clot burden, but no evidence of right heart strain. No pericardial effusion.  Thoracic aorta is unremarkable. Mediastinum/Nodes: No enlarged mediastinal, hilar, or axillary lymph nodes. Thyroid gland, trachea, and esophagus demonstrate no significant findings. Lungs/Pleura: Patchy bilateral perihilar ground-glass airspace disease consistent with multifocal pneumonia and given history of COVID-19. Areas of denser consolidation within the dependent lower lobes compatible with atelectasis or infarct. Trace right pleural effusion. No pneumothorax. Central airways are patent. Upper Abdomen: No acute abnormality. Musculoskeletal: No acute or destructive bony lesions. Reconstructed images demonstrate no additional findings. Review of the MIP images confirms the above findings. IMPRESSION: 1. Bilateral segmental pulmonary emboli,  with no evidence of right heart strain. 2. Patchy bilateral perihilar ground-glass airspace disease consistent with multifocal pneumonia, compatible with history of COVID 19. 3. Trace right pleural effusion.  4. Bibasilar areas of consolidation consistent with atelectasis or developing pulmonary infarct. Critical Value/emergent results were called by telephone at the time of interpretation on 04/02/2020 at 9:09 pm to provider Gerhard Munch , who verbally acknowledged these results. Electronically Signed   By: Sharlet Salina M.D.   On: 04/02/2020 21:10   DG Chest Port 1 View  Result Date: 04/05/2020 CLINICAL DATA:  COVID-19 infection. Worsening hypoxia and shortness of breath. EXAM: PORTABLE CHEST 1 VIEW COMPARISON:  CT and chest radiographs, 04/02/2020. FINDINGS: Lung base opacities are without change from the prior exam. No new lung abnormalities. Lung volumes low. No visualized pleural effusion.  No pneumothorax. Cardiac silhouette is normal in size. No mediastinal or hilar masses. IMPRESSION: 1. Lung base opacities are unchanged from the most recent prior chest radiograph. The more subtle areas of ground-glass opacity noted on the prior chest CT are not resolved radiographically. No new abnormalities. Electronically Signed   By: Amie Portland M.D.   On: 04/05/2020 15:14   DG Chest Portable 1 View  Result Date: 03/23/2020 CLINICAL DATA:  COVID EXAM: PORTABLE CHEST 1 VIEW COMPARISON:  March 15, 2018 FINDINGS: The heart size and mediastinal contours are within normal limits. Hazy airspace opacity seen at the right lung base. The visualized skeletal structures are unremarkable. IMPRESSION: Hazy airspace opacity at the right lung base which likely due to infectious etiology. Electronically Signed   By: Jonna Clark M.D.   On: 03/23/2020 20:08   VAS [place] LOWER EXTREMITY VENOUS (DVT)  Result Date: 04/03/2020  Lower Venous DVT Study Indications: Elevated ddimer.  Limitations: Body habitus and poor ultrasound/tissue interface. Comparison Study: no prior Performing Technologist: Blanch Media RVS  Examination Guidelines: A complete evaluation includes B-mode imaging, spectral Doppler, color Doppler, and power  Doppler as needed of all accessible portions of each vessel. Bilateral testing is considered an integral part of a complete examination. Limited examinations for reoccurring indications may be performed as noted. The reflux portion of the exam is performed with the patient in reverse Trendelenburg.  +---------+---------------+---------+-----------+----------+--------------+ RIGHT    CompressibilityPhasicitySpontaneityPropertiesThrombus Aging +---------+---------------+---------+-----------+----------+--------------+ CFV      Full           Yes      Yes                                 +---------+---------------+---------+-----------+----------+--------------+ SFJ      Full                                                        +---------+---------------+---------+-----------+----------+--------------+ FV Prox  Full                                                        +---------+---------------+---------+-----------+----------+--------------+ FV Mid   Full                                                        +---------+---------------+---------+-----------+----------+--------------+  FV Distal               Yes      Yes                                 +---------+---------------+---------+-----------+----------+--------------+ PFV      Full                                                        +---------+---------------+---------+-----------+----------+--------------+ POP      Full           Yes      Yes                                 +---------+---------------+---------+-----------+----------+--------------+ PTV      Full                                                        +---------+---------------+---------+-----------+----------+--------------+ PERO     Full                                                        +---------+---------------+---------+-----------+----------+--------------+    +---------+---------------+---------+-----------+----------+--------------+ LEFT     CompressibilityPhasicitySpontaneityPropertiesThrombus Aging +---------+---------------+---------+-----------+----------+--------------+ CFV      Full           Yes      Yes                                 +---------+---------------+---------+-----------+----------+--------------+ SFJ      Full                                                        +---------+---------------+---------+-----------+----------+--------------+ FV Prox  Full                                                        +---------+---------------+---------+-----------+----------+--------------+ FV Mid                  Yes      Yes                                 +---------+---------------+---------+-----------+----------+--------------+ FV Distal               Yes      Yes                                 +---------+---------------+---------+-----------+----------+--------------+  PFV      Full                                                        +---------+---------------+---------+-----------+----------+--------------+ POP      Full           Yes      Yes                                 +---------+---------------+---------+-----------+----------+--------------+ PTV      Full                                                        +---------+---------------+---------+-----------+----------+--------------+ PERO     Full                                                        +---------+---------------+---------+-----------+----------+--------------+     Summary: BILATERAL: - No evidence of deep vein thrombosis seen in the lower extremities, bilaterally. - No evidence of superficial venous thrombosis in the lower extremities, bilaterally. -No evidence of popliteal cyst, bilaterally.   *See table(s) above for measurements and observations. Electronically signed by Waverly Ferrari MD on  04/03/2020 at 7:58:25 PM.    Final

## 2020-04-09 ENCOUNTER — Inpatient Hospital Stay (HOSPITAL_COMMUNITY): Payer: 59

## 2020-04-09 DIAGNOSIS — R739 Hyperglycemia, unspecified: Secondary | ICD-10-CM

## 2020-04-09 DIAGNOSIS — I2602 Saddle embolus of pulmonary artery with acute cor pulmonale: Secondary | ICD-10-CM

## 2020-04-09 LAB — COMPREHENSIVE METABOLIC PANEL
ALT: 59 U/L — ABNORMAL HIGH (ref 0–44)
AST: 13 U/L — ABNORMAL LOW (ref 15–41)
Albumin: 2.7 g/dL — ABNORMAL LOW (ref 3.5–5.0)
Alkaline Phosphatase: 85 U/L (ref 38–126)
Anion gap: 10 (ref 5–15)
BUN: 17 mg/dL (ref 6–20)
CO2: 23 mmol/L (ref 22–32)
Calcium: 9.1 mg/dL (ref 8.9–10.3)
Chloride: 102 mmol/L (ref 98–111)
Creatinine, Ser: 0.77 mg/dL (ref 0.44–1.00)
GFR, Estimated: >60 mL/min (ref >60)
Glucose, Bld: 383 mg/dL — ABNORMAL HIGH (ref 70–99)
Potassium: 4.4 mmol/L (ref 3.5–5.1)
Sodium: 135 mmol/L (ref 135–145)
Total Bilirubin: 0.3 mg/dL (ref 0.3–1.2)
Total Protein: 6.2 g/dL — ABNORMAL LOW (ref 6.5–8.1)

## 2020-04-09 LAB — ECHOCARDIOGRAM COMPLETE
Area-P 1/2: 4.31 cm2
Calc EF: 60.1 %
Height: 61 in
S' Lateral: 2.3 cm
Single Plane A2C EF: 63.8 %
Single Plane A4C EF: 56.4 %
Weight: 3680 oz

## 2020-04-09 LAB — GLUCOSE, CAPILLARY
Glucose-Capillary: 297 mg/dL — ABNORMAL HIGH (ref 70–99)
Glucose-Capillary: 252 mg/dL — ABNORMAL HIGH (ref 70–99)
Glucose-Capillary: 344 mg/dL — ABNORMAL HIGH (ref 70–99)
Glucose-Capillary: 319 mg/dL — ABNORMAL HIGH (ref 70–99)

## 2020-04-09 MED ORDER — INSULIN DETEMIR 100 UNIT/ML ~~LOC~~ SOLN
25.0000 [IU] | Freq: Every day | SUBCUTANEOUS | Status: DC
  Administered 2020-04-09 – 2020-04-10 (×2): 25 [IU] via SUBCUTANEOUS
  Filled 2020-04-09 (×2): qty 0.25

## 2020-04-09 MED ORDER — METHYLPREDNISOLONE SODIUM SUCC 40 MG IJ SOLR
40.0000 mg | Freq: Two times a day (BID) | INTRAMUSCULAR | Status: DC
  Administered 2020-04-09 – 2020-04-10 (×2): 40 mg via INTRAVENOUS
  Filled 2020-04-09 (×2): qty 1

## 2020-04-09 MED ORDER — INSULIN ASPART 100 UNIT/ML ~~LOC~~ SOLN
4.0000 [IU] | Freq: Three times a day (TID) | SUBCUTANEOUS | Status: DC
  Administered 2020-04-09 – 2020-04-11 (×7): 4 [IU] via SUBCUTANEOUS

## 2020-04-09 NOTE — Progress Notes (Signed)
  Echocardiogram 2D Echocardiogram has been performed.  Faith [name] 04/09/2020, 10:11 AM

## 2020-04-09 NOTE — Progress Notes (Signed)
PROGRESS NOTE                                                                                                                                                                                                             Patient Demographics:    Faith [name], is a 29 y.o. female, DOB - 02-27-1991, ZOX:[id number]  Outpatient Primary MD for the patient is Patient, No Pcp Per    LOS - 7  Admit date - 04/02/2020    Chief Complaint  Patient presents with  . Chest Pain  . Back Pain       Brief Narrative (HPI from H&P)  - Faith [name] is a 29 y.o. female with medical history significant of essential hypertension not on any medication, morbid obesity, who was diagnosed with COVID-19 pneumonia on 03/23/20 -came to the hospital with chest pain.  In the ER was diagnosed with subsegmental PE along with COVID-19 pneumonia on CT chest and admitted to the hospital.   Subjective:    Faith [name] today reports some cough when using incentive spirometry, dyspnea has improved, tolerating some activity .   Assessment  & Plan :     Acute hypoxic respiratory failure due to acute bilateral subsegmental PE with acute COVID-19 pneumonia  - she is unfortunately not vaccinated. - initial COVID-19 pneumonia diagnosis on 03/23/2020.  PE likely due to inflammation from COVID-19, on full dose Lovenox, will require total of 6  months of anticoagulation, currently transitioned to Eliquis. -Continue with IV steroids.  Decadron changed to Solu-Medrol. -Treated  with IV remdesivir -started on baricitinib 12/2.  . -She is on 2 L at rest, but she is with significant oxygen requirement with activity, dyspneic with activity, this has been improving . -2D echo is pending -Venous Dopplers negative for DVT. -She was encouraged with incentive spirometry and flutter valve, out of bed to chair. -she is on antibiotics given elevated  procalcitonin on admission, will treat for bacterial pneumonia as well, changed levofloxacin to azithromycin/Rocephin.   SpO2: 96 % O2 Flow Rate (L/min): 3 L/min  Recent Labs  Lab 04/03/20 0245 04/03/20 0245 04/04/20 0055 04/04/20 0055 04/05/20 0043 04/06/20 0148 04/07/20 0343 04/08/20 0424 04/09/20 0406  WBC 16.5*  --  18.9*  --  18.2* 17.6* 17.9*  --   --   HGB 10.9*  --  10.6*  --  11.1* 10.8* 10.8*  --   --   HCT 36.1  --  35.2*  --  35.6* 35.1* 35.0*  --   --   PLT 356  --  346  --  393 414* 404*  --   --   CRP 25.5*  --  26.8*  --  14.4* 5.4* 2.1*  --   --   DDIMER 3.38*  --  3.70*  --  4.49* 5.12* 2.49*  --   --   PROCALCITON 0.23  --  0.11  --  0.21  --   --   --   --   AST 14*   < > 24   < > 87* 38 21 19 13*  ALT 20   < > 30   < > 109* 119* 96* 76* 59*  ALKPHOS 50   < > 58   < > 81 71 71 83 85  BILITOT 0.8   < > 0.4   < > 0.3 0.6 0.3 <0.1* 0.3  ALBUMIN 2.9*   < > 2.8*   < > 2.8* 2.7* 2.7* 2.7* 2.7*   < > = values in this interval not displayed.     Morbid obesity.  BMI 43.   - Follow with PCP for weight loss.  Clinical dehydration.  -  Hydrate with IV fluids  Hyperglycemia/prediabetes -Her A1c is 6.4, she was counseled, remains elevated in the setting of steroids and COVID-19, will increase Levemir to 25 units, will add 5 units of NovoLog before meals, likely she will need to be discharged on Metformin, her diet has been changed to carb modified.   I-STAT pregnancy test negative   Condition - Fair  Family Communication  : I have discussed with patient, all her questions were answered in details .  father updated by phone 04/05/2020, 04/08/2020  Code Status : Full  Consults  : None  Procedures  :    CT chest.  Bilateral subsegmental PE and consolidation, bilateral groundglass opacities  PUD Prophylaxis : none  Disposition Plan  :    Status is: Inpatient  Remains inpatient appropriate because:IV treatments appropriate due to intensity of illness or  inability to take PO   Dispo: The patient is from: Home              Anticipated d/c is to: Home              Anticipated d/c date is: 3 days              Patient currently is not medically stable to d/c.      DVT Prophylaxis  :  Lovenox >> Eliquis  Lab Results  Component Value Date   PLT 404 (H) 04/07/2020    Diet :  Diet Order            Diet Carb Modified Fluid consistency: Thin; Room service appropriate? Yes  Diet effective now                  Inpatient Medications  Scheduled Meds: . apixaban  10 mg Oral BID   Followed by  . [START ON 04/12/2020] apixaban  5 mg Oral BID  . baricitinib  4 mg Oral Daily  . influenza vac split quadrivalent PF  0.5 mL Intramuscular Tomorrow-1000  . insulin aspart  0-15 Units Subcutaneous TID WC  . insulin aspart  0-5 Units Subcutaneous QHS  . insulin aspart  4 Units Subcutaneous TID  WC  . insulin detemir  25 Units Subcutaneous Daily  . Ipratropium-Albuterol  1 puff Inhalation BID  . methylPREDNISolone (SOLU-MEDROL) injection  40 mg Intravenous Q8H  . multivitamin with minerals  1 tablet Oral Daily  . pantoprazole  40 mg Oral Daily   Continuous Infusions:  PRN Meds:.acetaminophen, chlorpheniramine-HYDROcodone, guaiFENesin-dextromethorphan, [DISCONTINUED] ondansetron **OR** ondansetron (ZOFRAN) IV, traMADol  Antibiotics  :    Anti-infectives (From admission, onward)   Start     Dose/Rate Route Frequency Ordered Stop   04/05/20 1000  remdesivir 100 mg in sodium chloride 0.9 % 100 mL IVPB       "Followed by" Linked Group Details   100 mg 200 mL/hr over 30 Minutes Intravenous Daily 04/04/20 1034 04/08/20 0920   04/04/20 1130  remdesivir 200 mg in sodium chloride 0.9% 250 mL IVPB       "Followed by" Linked Group Details   200 mg 580 mL/hr over 30 Minutes Intravenous Once 04/04/20 1034 04/04/20 1459   04/04/20 1130  cefTRIAXone (ROCEPHIN) 1 g in sodium chloride 0.9 % 100 mL IVPB        1 g 200 mL/hr over 30 Minutes Intravenous  Every 24 hours 04/04/20 1034 04/07/20 1239   04/04/20 1130  azithromycin (ZITHROMAX) tablet 500 mg        500 mg Oral Daily 04/04/20 1034 04/07/20 0851   04/03/20 1000  levofloxacin (LEVAQUIN) tablet 750 mg  Status:  Discontinued        750 mg Oral Daily 04/03/20 0906 04/04/20 1035        Jakyle Petrucelli M.D on 04/09/2020 at 10:10 AM  To page go to [email or web address]   [facility] -  Office  [phone]    See all Orders from today for further details    Objective:   Vitals:   04/08/20 1930 04/08/20 1957 04/09/20 0005 04/09/20 0506  BP:  132/79 132/86 125/70  Pulse:  87 74 72  Resp:  18 18 20   Temp:  98.1 F (36.7 C) 98.3 F (36.8 C) 97.6 F (36.4 C)  TempSrc:  Oral Oral Oral  SpO2: 95% 96% 97% 96%  Weight:      Height:        Wt Readings from Last 3 Encounters:  04/02/20 104.3 kg  02/15/18 98 kg  05/25/17 106.1 kg     Intake/Output Summary (Last 24 hours) at 04/09/2020 1010 Last data filed at 04/08/2020 1300 Gross per 24 hour  Intake 240 ml  Output --  Net 240 ml     Physical Exam  Awake Alert, Oriented X 3, No new F.N deficits, Normal affect Symmetrical Chest wall movement, Good air movement bilaterally, CTAB RRR. +ve B.Sounds, Abd Soft, No tenderness, No rebound - guarding or rigidity. No Cyanosis, Clubbing or edema, No new Rash or bruise        Data Review:    CBC Recent Labs  Lab 04/03/20 0245 04/04/20 0055 04/05/20 0043 04/06/20 0148 04/07/20 0343  WBC 16.5* 18.9* 18.2* 17.6* 17.9*  HGB 10.9* 10.6* 11.1* 10.8* 10.8*  HCT 36.1 35.2* 35.6* 35.1* 35.0*  PLT 356 346 393 414* 404*  MCV 84.3 84.0 82.8 82.6 82.9  MCH 25.5* 25.3* 25.8* 25.4* 25.6*  MCHC 30.2 30.1 31.2 30.8 30.9  RDW 14.3 13.9 14.0 13.7 13.6  LYMPHSABS 1.9 1.3 1.4 2.1 2.2  MONOABS 1.3* 1.4* 0.6 0.6 0.8  EOSABS 0.0 0.0 0.0 0.0 0.0  BASOSABS 0.0 0.0 0.0 0.0 0.1    Recent Labs  Lab 04/03/20  1610 04/03/20 0245 04/04/20 0055 04/04/20 0055 04/05/20 0043  04/06/20 0148 04/07/20 0343 04/08/20 0424 04/09/20 0406  NA 138   < > 138   < > 142 140 137 136 135  K 3.9   < > 4.6   < > 4.4 4.5 4.7 4.4 4.4  CL 102   < > 104   < > 107 105 104 102 102  CO2 23   < > 23   < > GLUCOSE 124*   < > 125*   < > 149* 200* 324* 301* 383*  BUN 7   < > 9   < > CREATININE 0.85   < > 0.87   < > 0.68 0.63 0.71 0.71 0.77  CALCIUM 8.9   < > 9.2   < > 9.5 9.4 9.0 9.0 9.1  AST 14*   < > 24   < > 87* 38 21 19 13*  ALT 20   < > 30   < > 109* 119* 96* 76* 59*  ALKPHOS 50   < > 58   < > 81 71 71 83 85  BILITOT 0.8   < > 0.4   < > 0.3 0.6 0.3 <0.1* 0.3  ALBUMIN 2.9*   < > 2.8*   < > 2.8* 2.7* 2.7* 2.7* 2.7*  MG  --   --  2.5*  --  2.5* 2.4 2.3  --   --   CRP 25.5*  --  26.8*  --  14.4* 5.4* 2.1*  --   --   DDIMER 3.38*  --  3.70*  --  4.49* 5.12* 2.49*  --   --   PROCALCITON 0.23  --  0.11  --  0.21  --   --   --   --   HGBA1C  --   --   --   --   --   --  6.4*  --   --    < > = values in this interval not displayed.    ------------------------------------------------------------------------------------------------------------------ No results for input(s): CHOL, HDL, LDLCALC, TRIG, CHOLHDL, LDLDIRECT in the last 72 hours.  Lab Results  Component Value Date   HGBA1C 6.4 (H) 04/07/2020   ------------------------------------------------------------------------------------------------------------------ No results for input(s): TSH, T4TOTAL, T3FREE, THYROIDAB in the last 72 hours.  Invalid input(s): FREET3  Cardiac Enzymes No results for input(s): CKMB, TROPONINI, MYOGLOBIN in the last 168 hours.  Invalid input(s): CK ------------------------------------------------------------------------------------------------------------------ No results found for: BNP  Micro Results No results found for this or any previous visit (from the past 240 hour(s)).  Radiology Reports DG Chest 1 View  Result Date: 04/02/2020 CLINICAL DATA:   Shortness of breath, cough and low back pain. The patient tested positive for COVID-19 10 days ago. EXAM: CHEST  1 VIEW COMPARISON:  PA and lateral chest 03/15/2018. Single-view of the chest 03/23/2020. FINDINGS: Lung volumes are low. Streaky bibasilar airspace opacities are greater on the left and unchanged. No pneumothorax or pleural effusion. Heart size is normal. No acute or focal bony abnormality. IMPRESSION: Left greater than right streaky opacities are unchanged since the most recent examination and could be due to pneumonia or atelectasis. Electronically Signed   By: Drusilla Kanner M.D.   On: 04/02/2020 11:50   CT Angio Chest PE W and/or Wo Contrast  Result Date: 04/02/2020 CLINICAL DATA:  Chest and back pain since yesterday, COVID-19 positive 10 days ago EXAM: CT ANGIOGRAPHY  CHEST WITH CONTRAST TECHNIQUE: Multidetector CT imaging of the chest was performed using the standard protocol during bolus administration of intravenous contrast. Multiplanar CT image reconstructions and MIPs were obtained to evaluate the vascular anatomy. CONTRAST:  56mL OMNIPAQUE IOHEXOL 350 MG/ML SOLN COMPARISON:  04/02/2020 FINDINGS: Cardiovascular: This is a technically adequate evaluation of the pulmonary vasculature. There are bilateral segmental pulmonary emboli, greatest in the lower lobes. There is moderate clot burden, but no evidence of right heart strain. No pericardial effusion.  Thoracic aorta is unremarkable. Mediastinum/Nodes: No enlarged mediastinal, hilar, or axillary lymph nodes. Thyroid gland, trachea, and esophagus demonstrate no significant findings. Lungs/Pleura: Patchy bilateral perihilar ground-glass airspace disease consistent with multifocal pneumonia and given history of COVID-19. Areas of denser consolidation within the dependent lower lobes compatible with atelectasis or infarct. Trace right pleural effusion. No pneumothorax. Central airways are patent. Upper Abdomen: No acute abnormality.  Musculoskeletal: No acute or destructive bony lesions. Reconstructed images demonstrate no additional findings. Review of the MIP images confirms the above findings. IMPRESSION: 1. Bilateral segmental pulmonary emboli, with no evidence of right heart strain. 2. Patchy bilateral perihilar ground-glass airspace disease consistent with multifocal pneumonia, compatible with history of COVID 19. 3. Trace right pleural effusion. 4. Bibasilar areas of consolidation consistent with atelectasis or developing pulmonary infarct. Critical Value/emergent results were called by telephone at the time of interpretation on 04/02/2020 at 9:09 pm to provider Gerhard Munch , who verbally acknowledged these results. Electronically Signed   By: Sharlet Salina M.D.   On: 04/02/2020 21:10   DG Chest Port 1 View  Result Date: 04/05/2020 CLINICAL DATA:  COVID-19 infection. Worsening hypoxia and shortness of breath. EXAM: PORTABLE CHEST 1 VIEW COMPARISON:  CT and chest radiographs, 04/02/2020. FINDINGS: Lung base opacities are without change from the prior exam. No new lung abnormalities. Lung volumes low. No visualized pleural effusion.  No pneumothorax. Cardiac silhouette is normal in size. No mediastinal or hilar masses. IMPRESSION: 1. Lung base opacities are unchanged from the most recent prior chest radiograph. The more subtle areas of ground-glass opacity noted on the prior chest CT are not resolved radiographically. No new abnormalities. Electronically Signed   By: Amie Portland M.D.   On: 04/05/2020 15:14   DG Chest Portable 1 View  Result Date: 03/23/2020 CLINICAL DATA:  COVID EXAM: PORTABLE CHEST 1 VIEW COMPARISON:  March 15, 2018 FINDINGS: The heart size and mediastinal contours are within normal limits. Hazy airspace opacity seen at the right lung base. The visualized skeletal structures are unremarkable. IMPRESSION: Hazy airspace opacity at the right lung base which likely due to infectious etiology. Electronically  Signed   By: Jonna Clark M.D.   On: 03/23/2020 20:08   VAS [place] LOWER EXTREMITY VENOUS (DVT)  Result Date: 04/03/2020  Lower Venous DVT Study Indications: Elevated ddimer.  Limitations: Body habitus and poor ultrasound/tissue interface. Comparison Study: no prior Performing Technologist: Blanch Media RVS  Examination Guidelines: A complete evaluation includes B-mode imaging, spectral Doppler, color Doppler, and power Doppler as needed of all accessible portions of each vessel. Bilateral testing is considered an integral part of a complete examination. Limited examinations for reoccurring indications may be performed as noted. The reflux portion of the exam is performed with the patient in reverse Trendelenburg.  +---------+---------------+---------+-----------+----------+--------------+ RIGHT    CompressibilityPhasicitySpontaneityPropertiesThrombus Aging +---------+---------------+---------+-----------+----------+--------------+ CFV      Full           Yes      Yes                                 +---------+---------------+---------+-----------+----------+--------------+  SFJ      Full                                                        +---------+---------------+---------+-----------+----------+--------------+ FV Prox  Full                                                        +---------+---------------+---------+-----------+----------+--------------+ FV Mid   Full                                                        +---------+---------------+---------+-----------+----------+--------------+ FV Distal               Yes      Yes                                 +---------+---------------+---------+-----------+----------+--------------+ PFV      Full                                                        +---------+---------------+---------+-----------+----------+--------------+ POP      Full           Yes      Yes                                  +---------+---------------+---------+-----------+----------+--------------+ PTV      Full                                                        +---------+---------------+---------+-----------+----------+--------------+ PERO     Full                                                        +---------+---------------+---------+-----------+----------+--------------+   +---------+---------------+---------+-----------+----------+--------------+ LEFT     CompressibilityPhasicitySpontaneityPropertiesThrombus Aging +---------+---------------+---------+-----------+----------+--------------+ CFV      Full           Yes      Yes                                 +---------+---------------+---------+-----------+----------+--------------+ SFJ      Full                                                        +---------+---------------+---------+-----------+----------+--------------+  FV Prox  Full                                                        +---------+---------------+---------+-----------+----------+--------------+ FV Mid                  Yes      Yes                                 +---------+---------------+---------+-----------+----------+--------------+ FV Distal               Yes      Yes                                 +---------+---------------+---------+-----------+----------+--------------+ PFV      Full                                                        +---------+---------------+---------+-----------+----------+--------------+ POP      Full           Yes      Yes                                 +---------+---------------+---------+-----------+----------+--------------+ PTV      Full                                                        +---------+---------------+---------+-----------+----------+--------------+ PERO     Full                                                         +---------+---------------+---------+-----------+----------+--------------+     Summary: BILATERAL: - No evidence of deep vein thrombosis seen in the lower extremities, bilaterally. - No evidence of superficial venous thrombosis in the lower extremities, bilaterally. -No evidence of popliteal cyst, bilaterally.   *See table(s) above for measurements and observations. Electronically signed by Waverly Ferrari MD on 04/03/2020 at 7:58:25 PM.    Final

## 2020-04-09 NOTE — Evaluation (Addendum)
Physical Therapy Evaluation Patient Details Name: Faith [name] MRN: [id number] DOB: 04-30-1991 Today's Date: 04/09/2020   History of Present Illness  29 y.o. female with medical history significant of essential hypertension not on any medication, morbid obesity, who was diagnosed with COVID-19 pneumonia about 10 days PTA on 04/02/20. Began having chest pain; + bil PEs. LE dopplers negative.  Clinical Impression   Pt admitted with above diagnosis. 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 Patient with significant dyspnea and tachycardia with minimal slow walking x 20 ft (HR max 144 bpm; sats 97% on 3L (probe on ear). Patient also reporting dizziness and feeling unsteady, including feeling that the floor is moving at times. Vestibular assessment completed with +symptoms of vestibular neuritis. Patient's bedroom is on second floor (does have a 1/2 bath on main level). Pt reports she may discharge home tomorrow 12/7 and would recommend she not attempt to go upstairs to her room but instead should sleep on main level until tachycardia, dyspnea, and vertigo improve.  Pt currently with functional limitations due to the deficits listed below (see PT Problem List). Pt will benefit from skilled PT to increase their independence and safety with mobility to allow discharge to the venue listed below.       Follow Up Recommendations Home health PT;Supervision for mobility/OOB (HHPT for vestibular rehab)    Equipment Recommendations  Rolling walker with 5" wheels    Recommendations for Other Services OT consult     Precautions / Restrictions Precautions Precautions: Fall    Vestibular Assessment   04/09/20 1706  Symptom Behavior  Subjective history of current problem reports dizziness when walking "feels weak" "like I'm going to fall" "unsteady" describes feeling like the floor is moving when she makes certain movements in bed (she could  not specifiy the triggering movements)  Type of Dizziness  Imbalance;"World moves"  Frequency of Dizziness daily  Duration of Dizziness seconds   Symptom Nature Motion provoked  Aggravating Factors Activity in general  Relieving Factors Head stationary;Closing eyes  Progression of Symptoms Better  History of similar episodes none  Oculomotor Exam  Oculomotor Alignment Normal  Spontaneous Absent  Gaze-induced  Absent  Smooth Pursuits Saccades  Comment smooth pursuits with 1-2 hops in vertical plane  Vestibulo-Ocular Reflex  VOR 1 Head Only (x 1 viewing) could only tolerate 3-7 seconds       Mobility  Bed Mobility                    Transfers Overall transfer level: Modified independent Equipment used: None             General transfer comment: end of session demonstrated use of RW; pt too fatigued to attempt  Ambulation/Gait Ambulation/Gait assistance: Min guard Gait Distance (Feet): 20 Feet (seated rest, 30; seated rest 25) Assistive device: None Gait Pattern/deviations: Step-through pattern;Decreased stride length;Wide base of support Gait velocity: appropriately decr   General Gait Details: reports feeling unsteady; educated to widen BOS due to necessary slow velocity; educated on use of RW, however pt too fatigued to attempt  Stairs            Wheelchair Mobility    Modified Rankin (Stroke Patients Only)       Balance Overall balance assessment: Mild deficits observed, not formally tested  Pertinent Vitals/Pain Pain Assessment: No/denies pain    Home Living Family/patient expects to be discharged to:: Private residence Living Arrangements: Children;Spouse/significant other (fiance; 13,12, 30 yo) Available Help at Discharge: Family;Available 24 hours/day Type of Home: House Home Access: Stairs to enter Entrance Stairs-Rails: None Entrance Stairs-Number of Steps: 1 Home  Layout: Two level;Bed/bath upstairs;1/2 bath on main level Home Equipment: None      Prior Function Level of Independence: Independent         Comments: cook, Conservation officer, [employer] at [employer];      Hand Dominance   Dominant Hand: Right    Extremity/Trunk Assessment   Upper Extremity Assessment Upper Extremity Assessment: Overall WFL for tasks assessed    Lower Extremity Assessment Lower Extremity Assessment: Overall WFL for tasks assessed    Cervical / Trunk Assessment Cervical / Trunk Assessment: Other exceptions Cervical / Trunk Exceptions: overweight  Communication   Communication: Other (comment) (dyspnea)  Cognition Arousal/Alertness: Awake/alert Behavior During Therapy: WFL for tasks assessed/performed (slightly anxious) Overall Cognitive Status: Within Functional Limits for tasks assessed                                        General Comments General comments (skin integrity, edema, etc.): pt reports at night it looks/feels like the floor is shaking; when walking, feels weak and off-balance    Exercises Other Exercises Other Exercises: educated in VORx1 horizontal and vertical (pt can only tolerate 3 seconds horiz and 7 seconds vertical); provided "A" to focus on and unable to get handout to print. Pt educated handout to be provided tomorrow URL: [email or web address] Date: 04/09/2020 Prepared by: [facility]  Exercises Seated Gaze Fixation with Head Rotation & Head Nods - 2-3 x daily - 7 x weekly - 1 sets - 2 reps Standing VORx1 - 2-3 x daily - 7 x weekly - 1 sets - 4 reps - - hold Foam-head turns eyes open - 1 x daily - 7 x weekly - 1 sets - 10 reps - - hold Foam-eyes closed - 1 x daily - 7 x weekly - 1 sets - 3 reps - 30 sec hold  Patient Education Gaze Stabilization Tips   Assessment/Plan    PT Assessment Patient needs continued PT services  PT Problem List Decreased activity tolerance;Decreased balance;Decreased  mobility;Decreased knowledge of use of DME;Cardiopulmonary status limiting activity;Obesity       PT Treatment Interventions DME instruction;Gait training;Functional mobility training;Therapeutic activities;Therapeutic exercise;Balance training;Neuromuscular re-education;Patient/family education    PT Goals (Current goals can be found in the Care Plan section)  Acute Rehab PT Goals Patient Stated Goal: to get strong enough to work PT Goal Formulation: With patient Time For Goal Achievement: 04/23/20 Potential to Achieve Goals: Good    Frequency Min 4X/week   Barriers to discharge        Co-evaluation               AM-PAC PT "6 Clicks" Mobility  Outcome Measure Help needed turning from your back to your side while in a flat bed without using bedrails?: None Help needed moving from lying on your back to sitting on the side of a flat bed without using bedrails?: None Help needed moving to and from a bed to a chair (including a wheelchair)?: A Little Help needed standing up from a chair using your arms (e.g., wheelchair or bedside chair)?: A Little Help needed to walk  in hospital room?: A Little Help needed climbing 3-5 steps with a railing? : A Little 6 Click Score: 20    End of Session Equipment Utilized During Treatment: Oxygen Activity Tolerance: Patient limited by fatigue Patient left: in chair;with call bell/phone within reach Nurse Communication: Mobility status;Other (comment) (+vestibular deficits) PT Visit Diagnosis: Unsteadiness on feet (R26.81);Difficulty in walking, not elsewhere classified (R26.2);Dizziness and giddiness (R42)    Time: 4765-4650 PT Time Calculation (min) (ACUTE ONLY): 56 min   Charges:   PT Evaluation $PT Eval High Complexity: 1 High PT Treatments $Gait Training: 8-22 mins $Therapeutic Exercise: 8-22 mins $Neuromuscular Re-education: 8-22 mins         Jerolyn Center, PT Pager [phone]   Zena Amos 04/09/2020, 5:02 PM

## 2020-04-10 LAB — COMPREHENSIVE METABOLIC PANEL
ALT: 47 U/L — ABNORMAL HIGH (ref 0–44)
AST: 21 U/L (ref 15–41)
Albumin: 2.7 g/dL — ABNORMAL LOW (ref 3.5–5.0)
Alkaline Phosphatase: 66 U/L (ref 38–126)
Anion gap: 11 (ref 5–15)
BUN: 15 mg/dL (ref 6–20)
CO2: 24 mmol/L (ref 22–32)
Calcium: 8.7 mg/dL — ABNORMAL LOW (ref 8.9–10.3)
Chloride: 102 mmol/L (ref 98–111)
Creatinine, Ser: 0.63 mg/dL (ref 0.44–1.00)
GFR, Estimated: >60 mL/min (ref >60)
Glucose, Bld: 175 mg/dL — ABNORMAL HIGH (ref 70–99)
Potassium: 4.9 mmol/L (ref 3.5–5.1)
Sodium: 137 mmol/L (ref 135–145)
Total Bilirubin: 0.4 mg/dL (ref 0.3–1.2)
Total Protein: 6.1 g/dL — ABNORMAL LOW (ref 6.5–8.1)

## 2020-04-10 LAB — GLUCOSE, CAPILLARY
Glucose-Capillary: 108 mg/dL — ABNORMAL HIGH (ref 70–99)
Glucose-Capillary: 174 mg/dL — ABNORMAL HIGH (ref 70–99)
Glucose-Capillary: 212 mg/dL — ABNORMAL HIGH (ref 70–99)
Glucose-Capillary: 298 mg/dL — ABNORMAL HIGH (ref 70–99)

## 2020-04-10 MED ORDER — METHYLPREDNISOLONE SODIUM SUCC 40 MG IJ SOLR
40.0000 mg | Freq: Every day | INTRAMUSCULAR | Status: DC
  Administered 2020-04-11: 40 mg via INTRAVENOUS
  Filled 2020-04-10: qty 1

## 2020-04-10 MED ORDER — METFORMIN HCL 500 MG PO TABS: 1000.0000 mg | ORAL_TABLET | Freq: Two times a day (BID) | ORAL | Status: DC

## 2020-04-10 MED ORDER — LIVING WELL WITH DIABETES BOOK
Freq: Once | Status: AC
  Filled 2020-04-10: qty 1

## 2020-04-10 MED ORDER — MECLIZINE HCL 25 MG PO TABS
25.0000 mg | ORAL_TABLET | Freq: Three times a day (TID) | ORAL | Status: AC
  Administered 2020-04-10 (×3): 25 mg via ORAL
  Filled 2020-04-10 (×3): qty 1

## 2020-04-10 MED ORDER — INSULIN DETEMIR 100 UNIT/ML ~~LOC~~ SOLN
18.0000 [IU] | Freq: Every day | SUBCUTANEOUS | Status: DC
  Administered 2020-04-11: 18 [IU] via SUBCUTANEOUS
  Filled 2020-04-10: qty 0.18

## 2020-04-10 NOTE — Progress Notes (Signed)
SATURATION QUALIFICATIONS: (This note is used to comply with regulatory documentation for home oxygen)  Patient Saturations on Room Air at Rest = 93%  Patient Saturations on Room Air while Ambulating = 86%  Patient Saturations on 2 Liters of oxygen while Ambulating = 91%  Please briefly explain why patient needs home oxygen:  Pt's oxygen saturation drops to 86% while ambulation.  Saturation improves to 91% on 2 L of oxygen.

## 2020-04-10 NOTE — Progress Notes (Signed)
PROGRESS NOTE                                                                                                                                                                                                             Patient Demographics:    Faith [name], is a 29 y.o. female, DOB - 12/23/1990, IWL:[id number]  Outpatient Primary MD for the patient is Patient, No Pcp Per    LOS - 8  Admit date - 04/02/2020    Chief Complaint  Patient presents with  . Chest Pain  . Back Pain       Brief Narrative (HPI from H&P)  - Faith [name] is a 29 y.o. female with medical history significant of essential hypertension not on any medication, morbid obesity, who was diagnosed with COVID-19 pneumonia on 03/23/20 -came to the hospital with chest pain.  In the ER was diagnosed with subsegmental PE along with COVID-19 pneumonia on CT chest and admitted to the hospital.   Subjective:    Faith [name] today reports dyspnea has improved, she was dizzy, lightheaded and significantly dyspneic while working with PT yesterday .   Assessment  & Plan :     Acute hypoxic respiratory failure due to acute bilateral subsegmental PE with acute COVID-19 pneumonia  - she is unfortunately not vaccinated. - initial COVID-19 pneumonia diagnosis on 03/23/2020.  PE likely due to inflammation from COVID-19, on full dose Lovenox, will require total of 6  months of anticoagulation, currently transitioned to Eliquis. -Continue with IV steroids.  Hypoxia has improved we will taper Solu-Medrol to once daily -Treated  with IV remdesivir -started on baricitinib 12/2.  . -Seizure requirement has improved, she is 3 L with activity . -Patient is quite dyspneic with minimal activity, significantly tachycardic at 145 yesterday, dizzy, lightheaded and orthostatic only with moving 20 feet, I have obtain 2D echo, preserved echo at 60%, no right heart  strain, she was given TED hose to avoid further orthostasis, as well she was started on meclizine for her vertigo, PT OT to continue following and advance her ambulation gradually. -Venous Dopplers negative for DVT. -She was encouraged with incentive spirometry and flutter valve, out of bed to chair. -she is on antibiotics given elevated procalcitonin on admission, will treat for bacterial pneumonia as well, changed levofloxacin to azithromycin/Rocephin.   SpO2: 100 %  O2 Flow Rate (L/min): 1 L/min  Recent Labs  Lab 04/04/20 0055 04/04/20 0055 04/05/20 0043 04/05/20 0043 04/06/20 0148 04/07/20 0343 04/08/20 0424 04/09/20 0406 04/10/20 0036  WBC 18.9*  --  18.2*  --  17.6* 17.9*  --   --   --   HGB 10.6*  --  11.1*  --  10.8* 10.8*  --   --   --   HCT 35.2*  --  35.6*  --  35.1* 35.0*  --   --   --   PLT 346  --  393  --  414* 404*  --   --   --   CRP 26.8*  --  14.4*  --  5.4* 2.1*  --   --   --   DDIMER 3.70*  --  4.49*  --  5.12* 2.49*  --   --   --   PROCALCITON 0.11  --  0.21  --   --   --   --   --   --   AST 24   < > 87*   < > 38 21 19 13* 21  ALT 30   < > 109*   < > 119* 96* 76* 59* 47*  ALKPHOS 58   < > 81   < > 71 71 83 85 66  BILITOT 0.4   < > 0.3   < > 0.6 0.3 <0.1* 0.3 0.4  ALBUMIN 2.8*   < > 2.8*   < > 2.7* 2.7* 2.7* 2.7* 2.7*   < > = values in this interval not displayed.     Morbid obesity.  BMI 43.   - Follow with PCP for weight loss.  Clinical dehydration.  -  Hydrate with IV fluids  Hyperglycemia/prediabetes -Her A1c is 6.4, she was counseled, remains elevated in the setting of steroids and COVID-19, CBG extremely labile, I will decrease her Lantus given normal CBG this morning, I have discussed with the patient that she will need to be discharged on Metformin .   I-STAT pregnancy test negative   Condition - Fair  Family Communication  : I have discussed with patient, all her questions were answered in details .  father updated by phone 04/05/2020,  04/08/2020, 04/10/2020  Code Status : Full  Consults  : None  Procedures  :    CT chest.  Bilateral subsegmental PE and consolidation, bilateral groundglass opacities  PUD Prophylaxis : none  Disposition Plan  :    Status is: Inpatient  Remains inpatient appropriate because:IV treatments appropriate due to intensity of illness or inability to take PO   Dispo: The patient is from: Home              Anticipated d/c is to: Home              Anticipated d/c date is: 1 day              Patient currently is not medically stable to d/c.  Not ready for discharge yet given she is orthostatic, vertigo, significantly tachycardic with minimal activity, hopefully in 1-2 more days.      DVT Prophylaxis  :  Lovenox >> Eliquis  Lab Results  Component Value Date   PLT 404 (H) 04/07/2020    Diet :  Diet Order            Diet Carb Modified Fluid consistency: Thin; Room service appropriate? Yes  Diet effective now  Inpatient Medications  Scheduled Meds: . apixaban  10 mg Oral BID   Followed by  . [START ON 04/12/2020] apixaban  5 mg Oral BID  . baricitinib  4 mg Oral Daily  . influenza vac split quadrivalent PF  0.5 mL Intramuscular Tomorrow-1000  . insulin aspart  0-15 Units Subcutaneous TID WC  . insulin aspart  0-5 Units Subcutaneous QHS  . insulin aspart  4 Units Subcutaneous TID WC  . insulin detemir  25 Units Subcutaneous Daily  . Ipratropium-Albuterol  1 puff Inhalation BID  . meclizine  25 mg Oral TID  . metFORMIN  1,000 mg Oral BID WC  . methylPREDNISolone (SOLU-MEDROL) injection  40 mg Intravenous Q12H  . multivitamin with minerals  1 tablet Oral Daily  . pantoprazole  40 mg Oral Daily   Continuous Infusions:  PRN Meds:.acetaminophen, chlorpheniramine-HYDROcodone, guaiFENesin-dextromethorphan, [DISCONTINUED] ondansetron **OR** ondansetron (ZOFRAN) IV, traMADol  Antibiotics  :    Anti-infectives (From admission, onward)   Start     Dose/Rate  Route Frequency Ordered Stop   04/05/20 1000  remdesivir 100 mg in sodium chloride 0.9 % 100 mL IVPB       "Followed by" Linked Group Details   100 mg 200 mL/hr over 30 Minutes Intravenous Daily 04/04/20 1034 04/08/20 0920   04/04/20 1130  remdesivir 200 mg in sodium chloride 0.9% 250 mL IVPB       "Followed by" Linked Group Details   200 mg 580 mL/hr over 30 Minutes Intravenous Once 04/04/20 1034 04/04/20 1459   04/04/20 1130  cefTRIAXone (ROCEPHIN) 1 g in sodium chloride 0.9 % 100 mL IVPB        1 g 200 mL/hr over 30 Minutes Intravenous Every 24 hours 04/04/20 1034 04/07/20 1239   04/04/20 1130  azithromycin (ZITHROMAX) tablet 500 mg        500 mg Oral Daily 04/04/20 1034 04/07/20 0851   04/03/20 1000  levofloxacin (LEVAQUIN) tablet 750 mg  Status:  Discontinued        750 mg Oral Daily 04/03/20 0906 04/04/20 1035        Nyelle Wolfson M.D on 04/10/2020 at 10:18 AM  To page go to [email or web address]   [facility] -  Office  [phone]    See all Orders from today for further details    Objective:   Vitals:   04/09/20 0506 04/09/20 0834 04/09/20 2035 04/10/20 0518  BP: 125/70  133/79 121/84  Pulse: 72  83 70  Resp: 20  19 (!) 21  Temp: 97.6 F (36.4 C)  97.8 F (36.6 C) 97.7 F (36.5 C)  TempSrc: Oral  Axillary Axillary  SpO2: 96% 95% 100% 100%  Weight:      Height:        Wt Readings from Last 3 Encounters:  04/02/20 104.3 kg  02/15/18 98 kg  05/25/17 106.1 kg     Intake/Output Summary (Last 24 hours) at 04/10/2020 1018 Last data filed at 04/10/2020 0923 Gross per 24 hour  Intake 120 ml  Output --  Net 120 ml     Physical Exam  Awake Alert, Oriented X 3, No new F.N deficits, Normal affect Symmetrical Chest wall movement, Good air movement bilaterally, CTAB RRR +ve B.Sounds, Abd Soft, No tenderness, No rebound - guarding or rigidity. No Cyanosis, Clubbing or edema, No new Rash or bruise      Data Review:    CBC Recent Labs  Lab  04/04/20 0055 04/05/20 0043 04/06/20 0148 04/07/20 0343  WBC  18.9* 18.2* 17.6* 17.9*  HGB 10.6* 11.1* 10.8* 10.8*  HCT 35.2* 35.6* 35.1* 35.0*  PLT 346 393 414* 404*  MCV 84.0 82.8 82.6 82.9  MCH 25.3* 25.8* 25.4* 25.6*  MCHC 30.1 31.2 30.8 30.9  RDW 13.9 14.0 13.7 13.6  LYMPHSABS 1.3 1.4 2.1 2.2  MONOABS 1.4* 0.6 0.6 0.8  EOSABS 0.0 0.0 0.0 0.0  BASOSABS 0.0 0.0 0.0 0.1    Recent Labs  Lab 04/04/20 0055 04/04/20 0055 04/05/20 0043 04/05/20 0043 04/06/20 0148 04/07/20 0343 04/08/20 0424 04/09/20 0406 04/10/20 0036  NA 138   < > 142   < > 140 137 136 135 137  K 4.6   < > 4.4   < > 4.5 4.7 4.4 4.4 4.9  CL 104   < > 107   < > 105 104 102 102 102  CO2 23   < > 22   < > 23 24 25 23 24   GLUCOSE 125*   < > 149*   < > 200* 324* 301* 383* 175*  BUN 9   < > 7   < > 10 14 13 17 15   CREATININE 0.87   < > 0.68   < > 0.63 0.71 0.71 0.77 0.63  CALCIUM 9.2   < > 9.5   < > 9.4 9.0 9.0 9.1 8.7*  AST 24   < > 87*   < > 38 21 19 13* 21  ALT 30   < > 109*   < > 119* 96* 76* 59* 47*  ALKPHOS 58   < > 81   < > 71 71 83 85 66  BILITOT 0.4   < > 0.3   < > 0.6 0.3 <0.1* 0.3 0.4  ALBUMIN 2.8*   < > 2.8*   < > 2.7* 2.7* 2.7* 2.7* 2.7*  MG 2.5*  --  2.5*  --  2.4 2.3  --   --   --   CRP 26.8*  --  14.4*  --  5.4* 2.1*  --   --   --   DDIMER 3.70*  --  4.49*  --  5.12* 2.49*  --   --   --   PROCALCITON 0.11  --  0.21  --   --   --   --   --   --   HGBA1C  --   --   --   --   --  6.4*  --   --   --    < > = values in this interval not displayed.    ------------------------------------------------------------------------------------------------------------------ No results for input(s): CHOL, HDL, LDLCALC, TRIG, CHOLHDL, LDLDIRECT in the last 72 hours.  Lab Results  Component Value Date   HGBA1C 6.4 (H) 04/07/2020   ------------------------------------------------------------------------------------------------------------------ No results for input(s): TSH, T4TOTAL, T3FREE, THYROIDAB in  the last 72 hours.  Invalid input(s): FREET3  Cardiac Enzymes No results for input(s): CKMB, TROPONINI, MYOGLOBIN in the last 168 hours.  Invalid input(s): CK ------------------------------------------------------------------------------------------------------------------ No results found for: BNP  Micro Results No results found for this or any previous visit (from the past 240 hour(s)).  Radiology Reports DG Chest 1 View  Result Date: 04/02/2020 CLINICAL DATA:  Shortness of breath, cough and low back pain. The patient tested positive for COVID-19 10 days ago. EXAM: CHEST  1 VIEW COMPARISON:  PA and lateral chest 03/15/2018. Single-view of the chest 03/23/2020. FINDINGS: Lung volumes are low. Streaky bibasilar airspace opacities are greater on the left and  unchanged. No pneumothorax or pleural effusion. Heart size is normal. No acute or focal bony abnormality. IMPRESSION: Left greater than right streaky opacities are unchanged since the most recent examination and could be due to pneumonia or atelectasis. Electronically Signed   By: Drusilla Kannerhomas  Dalessio M.D.   On: 04/02/2020 11:50   CT Angio Chest PE W and/or Wo Contrast  Result Date: 04/02/2020 CLINICAL DATA:  Chest and back pain since yesterday, COVID-19 positive 10 days ago EXAM: CT ANGIOGRAPHY CHEST WITH CONTRAST TECHNIQUE: Multidetector CT imaging of the chest was performed using the standard protocol during bolus administration of intravenous contrast. Multiplanar CT image reconstructions and MIPs were obtained to evaluate the vascular anatomy. CONTRAST:  75mL OMNIPAQUE IOHEXOL 350 MG/ML SOLN COMPARISON:  04/02/2020 FINDINGS: Cardiovascular: This is a technically adequate evaluation of the pulmonary vasculature. There are bilateral segmental pulmonary emboli, greatest in the lower lobes. There is moderate clot burden, but no evidence of right heart strain. No pericardial effusion.  Thoracic aorta is unremarkable. Mediastinum/Nodes: No  enlarged mediastinal, hilar, or axillary lymph nodes. Thyroid gland, trachea, and esophagus demonstrate no significant findings. Lungs/Pleura: Patchy bilateral perihilar ground-glass airspace disease consistent with multifocal pneumonia and given history of COVID-19. Areas of denser consolidation within the dependent lower lobes compatible with atelectasis or infarct. Trace right pleural effusion. No pneumothorax. Central airways are patent. Upper Abdomen: No acute abnormality. Musculoskeletal: No acute or destructive bony lesions. Reconstructed images demonstrate no additional findings. Review of the MIP images confirms the above findings. IMPRESSION: 1. Bilateral segmental pulmonary emboli, with no evidence of right heart strain. 2. Patchy bilateral perihilar ground-glass airspace disease consistent with multifocal pneumonia, compatible with history of COVID 19. 3. Trace right pleural effusion. 4. Bibasilar areas of consolidation consistent with atelectasis or developing pulmonary infarct. Critical Value/emergent results were called by telephone at the time of interpretation on 04/02/2020 at 9:09 pm to provider Gerhard MunchOBERT LOCKWOOD , who verbally acknowledged these results. Electronically Signed   By: Sharlet SalinaMichael  Brown M.D.   On: 04/02/2020 21:10   DG Chest Port 1 View  Result Date: 04/05/2020 CLINICAL DATA:  COVID-19 infection. Worsening hypoxia and shortness of breath. EXAM: PORTABLE CHEST 1 VIEW COMPARISON:  CT and chest radiographs, 04/02/2020. FINDINGS: Lung base opacities are without change from the prior exam. No new lung abnormalities. Lung volumes low. No visualized pleural effusion.  No pneumothorax. Cardiac silhouette is normal in size. No mediastinal or hilar masses. IMPRESSION: 1. Lung base opacities are unchanged from the most recent prior chest radiograph. The more subtle areas of ground-glass opacity noted on the prior chest CT are not resolved radiographically. No new abnormalities. Electronically  Signed   By: Amie Portlandavid  Ormond M.D.   On: 04/05/2020 15:14   DG Chest Portable 1 View  Result Date: 03/23/2020 CLINICAL DATA:  COVID EXAM: PORTABLE CHEST 1 VIEW COMPARISON:  March 15, 2018 FINDINGS: The heart size and mediastinal contours are within normal limits. Hazy airspace opacity seen at the right lung base. The visualized skeletal structures are unremarkable. IMPRESSION: Hazy airspace opacity at the right lung base which likely due to infectious etiology. Electronically Signed   By: Jonna ClarkBindu  Avutu M.D.   On: 03/23/2020 20:08   ECHOCARDIOGRAM COMPLETE  Result Date: 04/09/2020    ECHOCARDIOGRAM REPORT   Patient Name:   Marlyce HugeSHAUNDRA R Ducat Date of Exam: 04/09/2020 Medical Rec #:  [id number]             Height:       61.0 in Accession #:  [id number]            Weight:       230.0 lb Date of Birth:  07-17-1990             BSA:          2.004 m Patient Age:    29 years              BP:           125/70 mmHg Patient Gender: F                     HR:           77 bpm. Exam Location:  Inpatient Procedure: 2D Echo, Cardiac Doppler and Color Doppler Indications:    I26.02 Pulmonary embolus  History:        Patient has no prior history of Echocardiogram examinations.                 Signs/Symptoms:Shortness of Breath, Dyspnea and Chest Pain; Risk                 Factors:Hypertension. Covid positive. Pulmonary embolus.  Sonographer:    Sheralyn Boatman RDCS Referring Phys: [id number] Denna Fryberger S [facility]  Sonographer Comments: Technically difficult study due to poor echo windows and patient is morbidly obese. Image acquisition challenging due to patient body habitus. IMPRESSIONS  1. Left ventricular ejection fraction, by estimation, is 60 to 65%. Left ventricular ejection fraction by 2D MOD biplane is 60.1 %. The left ventricle has normal function. The left ventricle has no regional wall motion abnormalities. Left ventricular diastolic parameters were normal.  2. Right ventricular systolic function is normal. The right  ventricular size is normal. Tricuspid regurgitation signal is inadequate for assessing PA pressure.  3. The mitral valve is grossly normal. No evidence of mitral valve regurgitation. No evidence of mitral stenosis.  4. The aortic valve is tricuspid. Aortic valve regurgitation is not visualized. No aortic stenosis is present.  5. The inferior vena cava is normal in size with greater than 50% respiratory variability, suggesting right atrial pressure of 3 mmHg. Conclusion(s)/Recommendation(s): Normal biventricular function without evidence of hemodynamically significant valvular heart disease. FINDINGS  Left Ventricle: Left ventricular ejection fraction, by estimation, is 60 to 65%. Left ventricular ejection fraction by 2D MOD biplane is 60.1 %. The left ventricle has normal function. The left ventricle has no regional wall motion abnormalities. The left ventricular internal cavity size was normal in size. There is no left ventricular hypertrophy. Left ventricular diastolic parameters were normal. Normal left ventricular filling pressure. Right Ventricle: The right ventricular size is normal. No increase in right ventricular wall thickness. Right ventricular systolic function is normal. Tricuspid regurgitation signal is inadequate for assessing PA pressure. Left Atrium: Left atrial size was normal in size. Right Atrium: Right atrial size was normal in size. Pericardium: Trivial pericardial effusion is present. Mitral Valve: The mitral valve is grossly normal. No evidence of mitral valve regurgitation. No evidence of mitral valve stenosis. Tricuspid Valve: The tricuspid valve is grossly normal. Tricuspid valve regurgitation is not demonstrated. No evidence of tricuspid stenosis. Aortic Valve: The aortic valve is tricuspid. Aortic valve regurgitation is not visualized. No aortic stenosis is present. Pulmonic Valve: The pulmonic valve was grossly normal. Pulmonic valve regurgitation is not visualized. No evidence of  pulmonic stenosis. Aorta: The aortic root and ascending aorta are structurally normal, with no evidence of dilitation. Venous: The right upper pulmonary vein is normal.  The inferior vena cava is normal in size with greater than 50% respiratory variability, suggesting right atrial pressure of 3 mmHg. IAS/Shunts: The atrial septum is grossly normal.  LEFT VENTRICLE PLAX 2D                        Biplane EF (MOD) LVIDd:         4.40 cm         LV Biplane EF:   Left LVIDs:         2.30 cm                          ventricular LV PW:         1.10 cm                          ejection LV IVS:        1.04 cm                          fraction by LVOT diam:     2.10 cm                          2D MOD LV SV:         74                               biplane is LV SV Index:   37                               60.1 %. LVOT Area:     3.46 cm                                Diastology                                LV e' medial:    9.79 cm/s LV Volumes (MOD)               LV E/e' medial:  9.1 LV vol d, MOD    78.8 ml       LV e' lateral:   9.90 cm/s A2C:                           LV E/e' lateral: 9.0 LV vol d, MOD    78.3 ml A4C: LV vol s, MOD    28.5 ml A2C: LV vol s, MOD    34.2 ml A4C: LV SV MOD A2C:   50.3 ml LV SV MOD A4C:   78.3 ml LV SV MOD BP:    47.1 ml IVC IVC diam: 1.40 cm LEFT ATRIUM             Index       RIGHT ATRIUM          Index LA diam:        2.80 cm 1.40 cm/m  RA Area:     9.08 cm LA Vol (A2C):   17.9 ml 8.93 ml/m  RA Volume:   13.80 ml 6.89 ml/m  LA Vol (A4C):   20.9 ml 10.43 ml/m LA Biplane Vol: 20.2 ml 10.08 ml/m  AORTIC VALVE LVOT Vmax:   106.00 cm/s LVOT Vmean:  79.400 cm/s LVOT VTI:    0.214 m  AORTA Ao Root diam: 3.00 cm Ao Asc diam:  2.80 cm MITRAL VALVE MV Area (PHT): 4.31 cm    SHUNTS MV Decel Time: 176 msec    Systemic VTI:  0.21 m MV E velocity: 89.20 cm/s  Systemic Diam: 2.10 cm MV A velocity: 53.80 cm/s MV E/A ratio:  1.66 Lennie Odor MD Electronically signed by Lennie Odor MD Signature  Date/Time: 04/09/2020/11:23:38 AM    Final    VAS [place] LOWER EXTREMITY VENOUS (DVT)  Result Date: 04/03/2020  Lower Venous DVT Study Indications: Elevated ddimer.  Limitations: Body habitus and poor ultrasound/tissue interface. Comparison Study: no prior Performing Technologist: Blanch Media RVS  Examination Guidelines: A complete evaluation includes B-mode imaging, spectral Doppler, color Doppler, and power Doppler as needed of all accessible portions of each vessel. Bilateral testing is considered an integral part of a complete examination. Limited examinations for reoccurring indications may be performed as noted. The reflux portion of the exam is performed with the patient in reverse Trendelenburg.  +---------+---------------+---------+-----------+----------+--------------+ RIGHT    CompressibilityPhasicitySpontaneityPropertiesThrombus Aging +---------+---------------+---------+-----------+----------+--------------+ CFV      Full           Yes      Yes                                 +---------+---------------+---------+-----------+----------+--------------+ SFJ      Full                                                        +---------+---------------+---------+-----------+----------+--------------+ FV Prox  Full                                                        +---------+---------------+---------+-----------+----------+--------------+ FV Mid   Full                                                        +---------+---------------+---------+-----------+----------+--------------+ FV Distal               Yes      Yes                                 +---------+---------------+---------+-----------+----------+--------------+ PFV      Full                                                        +---------+---------------+---------+-----------+----------+--------------+ POP      Full           Yes  Yes                                  +---------+---------------+---------+-----------+----------+--------------+ PTV      Full                                                        +---------+---------------+---------+-----------+----------+--------------+ PERO     Full                                                        +---------+---------------+---------+-----------+----------+--------------+   +---------+---------------+---------+-----------+----------+--------------+ LEFT     CompressibilityPhasicitySpontaneityPropertiesThrombus Aging +---------+---------------+---------+-----------+----------+--------------+ CFV      Full           Yes      Yes                                 +---------+---------------+---------+-----------+----------+--------------+ SFJ      Full                                                        +---------+---------------+---------+-----------+----------+--------------+ FV Prox  Full                                                        +---------+---------------+---------+-----------+----------+--------------+ FV Mid                  Yes      Yes                                 +---------+---------------+---------+-----------+----------+--------------+ FV Distal               Yes      Yes                                 +---------+---------------+---------+-----------+----------+--------------+ PFV      Full                                                        +---------+---------------+---------+-----------+----------+--------------+ POP      Full           Yes      Yes                                 +---------+---------------+---------+-----------+----------+--------------+ PTV      Full                                                        +---------+---------------+---------+-----------+----------+--------------+  PERO     Full                                                         +---------+---------------+---------+-----------+----------+--------------+     Summary: BILATERAL: - No evidence of deep vein thrombosis seen in the lower extremities, bilaterally. - No evidence of superficial venous thrombosis in the lower extremities, bilaterally. -No evidence of popliteal cyst, bilaterally.   *See table(s) above for measurements and observations. Electronically signed by Waverly Ferrari MD on 04/03/2020 at 7:58:25 PM.    Final

## 2020-04-10 NOTE — Progress Notes (Signed)
Physical Therapy Treatment Patient Details Name: Faith [name] MRN: [id number] DOB: 1990/10/06 Today's Date: 04/10/2020    History of Present Illness 29 y.o. female with medical history significant of essential hypertension not on any medication, morbid obesity, who was diagnosed with COVID-19 pneumonia about 10 days PTA on 04/02/20. Began having chest pain; + bil PEs. LE dopplers negative.    PT Comments    Patient doing much better today. Feels much more secure using RW with better tolerance for walking-- HR max 122 bpm with dyspnea better controlled using pursed lip breathing. She was not orthostatic today (or yesterday) but she continues with symptoms of vestibular neuritis (dizzy/unsteady with certain movements). Assessed for central vestibular or cerebellar deficits and pt was negative: VOR cancellation, rapid alternating movements, coordination (FNF). She has been doing her VORx1 exercises and already showed improvement. Provided handout with detailed instructions and explained how to advance exercises as she progresses in case she cannot afford HHPT. (Currently qualifies for HHPT due to limited ability to mobilize due to tachycardia and fatigue s/p bil PEs. She would not be able to tolerate getting to/from OP clinic). Will contact MD re: patient's significant improvement.   Blood pressures: Seated BP 137/76 HR 94 sats 100% on room air Standing BP 140/96 HR 103 sats 100% on room air     Follow Up Recommendations  Home health PT;Supervision for mobility/OOB (HHPT for vestibular rehab)     Equipment Recommendations  Rolling walker with 5" wheels    Recommendations for Other Services       Precautions / Restrictions Precautions Precautions: Fall    Mobility  Bed Mobility                  Transfers Overall transfer level: Needs assistance Equipment used: Rolling walker (2 wheeled) Transfers: Sit to/from Stand Sit to Stand: Supervision         General  transfer comment: vc for proper hand placement  Ambulation/Gait Ambulation/Gait assistance: Supervision Gait Distance (Feet): 40 Feet Assistive device: Rolling walker (2 wheeled) Gait Pattern/deviations: Step-through pattern;Decreased stride length;Wide base of support Gait velocity: appropriately decr   General Gait Details: feels much more secure with RW; educated on turns and stepping sideways thru narrow openings   Stairs Stairs:  (no)           Wheelchair Mobility    Modified Rankin (Stroke Patients Only)       Balance Overall balance assessment: Mild deficits observed, not formally tested                                          Cognition Arousal/Alertness: Awake/alert Behavior During Therapy: WFL for tasks assessed/performed Overall Cognitive Status: Within Functional Limits for tasks assessed                                        Exercises Other Exercises Other Exercises: VORx1 horizontal and vertical; pt able to tolerate incr velocity for up to 15 seconds x 3 reps each direction    General Comments General comments (skin integrity, edema, etc.): reports MD cancelled d/c home today due to "BP dropping"      Pertinent Vitals/Pain Pain Assessment: No/denies pain    Home Living  Prior Function            PT Goals (current goals can now be found in the care plan section) Acute Rehab PT Goals Patient Stated Goal: to get strong enough to work Time For Goal Achievement: 04/23/20 Potential to Achieve Goals: Good Progress towards PT goals: Progressing toward goals    Frequency    Min 4X/week      PT Plan Current plan remains appropriate    Co-evaluation              AM-PAC PT "6 Clicks" Mobility   Outcome Measure  Help needed turning from your back to your side while in a flat bed without using bedrails?: None Help needed moving from lying on your back to sitting on the  side of a flat bed without using bedrails?: None Help needed moving to and from a bed to a chair (including a wheelchair)?: A Little Help needed standing up from a chair using your arms (e.g., wheelchair or bedside chair)?: A Little Help needed to walk in hospital room?: A Little Help needed climbing 3-5 steps with a railing? : A Little 6 Click Score: 20    End of Session   Activity Tolerance: Patient limited by fatigue Patient left: in chair;with call bell/phone within reach Nurse Communication: Mobility status;Other (comment) (no orthostasis) PT Visit Diagnosis: Unsteadiness on feet (R26.81);Difficulty in walking, not elsewhere classified (R26.2);Dizziness and giddiness (R42)     Time: 4098-1191 PT Time Calculation (min) (ACUTE ONLY): 37 min  Charges:  $Gait Training: 8-22 mins $Therapeutic Exercise: 8-22 mins                      Jerolyn Center, PT Pager [phone]    Zena Amos 04/10/2020, 12:16 PM

## 2020-04-10 NOTE — Progress Notes (Signed)
Occupational Therapy Evaluation Patient Details Name: Faith [name] MRN: [id number] DOB: [birth year] Today's Date: 04/10/2020    History of Present Illness 29 y.o. female with medical history significant of essential hypertension not on any medication, morbid obesity, who was diagnosed with COVID-19 pneumonia about 10 days PTA on 04/02/20. Began having chest pain; + bil PEs. LE dopplers negative.   Clinical Impression   PTA pt lives independently with her fiance and 3 children and works full-time at her [employer]. Entered room after pt had completed her bath and trying to walk back to her chair. Pt with 3/4 DOE and nsg had called to say HR was in the 160s. Pt assisted to the [facility] to rest. Poor pleth with SpO2 ranging from 85-98 on 2L. Pt reports she "feels better" on O2 - will continue to monitor. Began education on energy conservation and relaxation/breathing techniques. Handouts provided. Pt able to return demonstrate and states the breathing techniques "help". Attempted slow ambulation  - max @ 30 ft using rollator-  HR up to 145 bpm. Pt complained of feeling light headed and "seeing black spots". Nsg brought recliner and pt returned to room. Once back to room took orthostatics: Orthostatic BPs  Supine   Sitting 127/83     Standing 113/81  Standing after 1 min 93/52  MD/nsg notified Will follow acutely to facilitate safe DC home. Pt very eager to go home but do not feel pt is safe to DC home tomorrow.    Follow Up Recommendations  No OT follow up;Supervision - Intermittent    Equipment Recommendations  3 in 1 bedside commode    Recommendations for Other Services       Precautions / Restrictions Precautions Precautions: Fall      Mobility Bed Mobility Overal bed mobility: Independent                  Transfers Overall transfer level: Needs assistance   Transfers: Sit to/from Stand Sit to Stand: Supervision              Balance Overall balance  assessment: Mild deficits observed, not formally tested (feeling  "a little dizzy" )                                         ADL either performed or assessed with clinical judgement   ADL Overall ADL's : Needs assistance/impaired                                     Functional mobility during ADLs: Supervision/safety General ADL Comments: Able to complete ADL tasks with set up however HR increases to160s per nursing; 3/4 DOE. Began energy conservation stratgeiges     Vision         Perception     Praxis      Pertinent Vitals/Pain Pain Assessment: No/denies pain     Hand Dominance     Extremity/Trunk Assessment Upper Extremity Assessment Upper Extremity Assessment: Overall WFL for tasks assessed   Lower Extremity Assessment Lower Extremity Assessment: Defer to PT evaluation   Cervical / Trunk Assessment Cervical / Trunk Assessment: Normal   Communication     Cognition Arousal/Alertness: Awake/alert Behavior During Therapy: WFL for tasks assessed/performed (anxious) Overall Cognitive Status: Within Functional Limits for tasks assessed  General Comments   ambulated with use of rollator. Pt incorporating breathing techniques. HR into 130s then increased to 145. Pt sat to rest - after @ 30 ft. Attempted to walk back to room after resting, however pt complaining of feeling light headed and "seeing black spots". Nsg got recliner and pt taken back to room. Orthostatics taken once back to room.     Exercises Other Exercises Other Exercises: relaxation techniques - deep breathing 4 count inspiration/expiration; visualization   Shoulder Instructions      Home Living Family/patient expects to be discharged to:: Private residence Living Arrangements: Children;Spouse/significant other Available Help at Discharge: Family;Available 24 hours/day Type of Home: House Home Access: Stairs to  enter [employer] of Steps: 1 Entrance Stairs-Rails: None Home Layout: Two level;Bed/bath upstairs;1/2 bath on main level Alternate Level Stairs-Number of Steps: 12 Alternate Level Stairs-Rails: Right Bathroom Shower/Tub: Producer, television/film/video: Standard Bathroom Accessibility: Yes How Accessible: Accessible via walker Home Equipment: None          Prior Functioning/Environment Level of Independence: Independent        Comments: cook, Conservation officer, nature at [employer];         OT Problem List: Decreased activity tolerance;Impaired balance (sitting and/or standing);Decreased knowledge of use of DME or AE;Cardiopulmonary status limiting activity;Obesity      OT Treatment/Interventions: Self-care/ADL training;Therapeutic exercise;Energy conservation;DME and/or AE instruction;Therapeutic activities;Patient/family education    OT Goals(Current goals can be found in the care plan section) Acute Rehab OT Goals Patient Stated Goal: to get strong enough to work OT Goal Formulation: With patient Time For Goal Achievement: 04/24/20 Potential to Achieve Goals: Good  OT Frequency: Min 2X/week   Barriers to D/C:            Co-evaluation              AM-PAC OT "6 Clicks" Daily Activity     Outcome Measure Help from another person eating meals?: None Help from another person taking care of personal grooming?: A Little Help from another person toileting, which includes using toliet, bedpan, or urinal?: A Little Help from another person bathing (including washing, rinsing, drying)?: A Little Help from another person to put on and taking off regular upper body clothing?: A Little Help from another person to put on and taking off regular lower body clothing?: A Little 6 Click Score: 19   End of Session Equipment Utilized During Treatment: Other (comment);Oxygen (2L; rollator) Nurse Communication: Mobility status;Other (comment) (HR; BP)  Activity  Tolerance: Patient tolerated treatment well Patient left: in chair;with call bell/phone within reach  OT Visit Diagnosis: Other abnormalities of gait and mobility (R26.89);Unsteadiness on feet (R26.81);Dizziness and giddiness (R42)                Time: 1515-1610 OT Time Calculation (min): 55 min Charges:  OT General Charges $OT Visit: 1 Visit OT Evaluation $OT Eval Moderate Complexity: 1 Mod OT Treatments $Self Care/Home Management : 23-37 mins $Therapeutic Activity: 8-22 mins  Luisa Dago, OT/L   Acute OT Clinical Specialist Acute Rehabilitation Services Pager [phone] Office [phone]   [facility] 04/10/2020, 5:11 PM

## 2020-04-10 NOTE — TOC Initial Note (Signed)
Transition of Care (TOC) - Initial/Assessment Note  Donn Pierini RN, BSN Transitions of Care Unit 4E- RN Case Manager See Treatment Team for direct phone #    Patient Details  Name: Faith [name] MRN: [id number] Date of Birth: 02-16-1991  Transition of Care [facility]) CM/SW Contact:    Darrold Span, RN Phone Number: 04/10/2020, 3:13 PM  Clinical Narrative:                 Pt admitted with COVID, +test on 03/23/20 prior to admission. Noted orders for [facility] and DME needs. TC made to patient's room and spoke with pt regarding transition needs. Choice offered for [facility] and DME- Per CMS guidelines from medicare.gov website with star ratings- per pt she states she does not have a preference and only concern is making sure agency is in net work and services covered by insurance- she defers to this Clinical research associate to secure [facility] and DME agencies on her behalf. Discussed if [facility] agency could not be secured she would go home with exercises provided to her by in house PT/OT. - she is requesting a RW and 3n1.  Address confirmed with pt in epic, pt has f/u appointment scheduled with the Post COVID clinic for 12/20.   Call made to [facility] with [facility] for home 02 needs- once insurance processed- portable equipment to be delivered to nursing station on unit for transport home. They will contact pt regarding home 02 arrangements.   Call made to [facility] for [facility] referral- PT/OT (vestibular rehab)- referral has been accepted.   TOC to f/u for RW and 3n1 needs after orders placed and call for delivery by [employer] prior to discharge.    Expected Discharge Plan: Home w Home Health Services Barriers to Discharge: Continued Medical Work up   Patient Goals and CMS Choice Patient states their goals for this hospitalization and ongoing recovery are:: return home CMS Medicare.gov Compare Post Acute Care list provided to:: Patient Choice offered to / list presented to : Patient  Expected Discharge Plan and Services Expected  Discharge Plan: Home w Home Health Services   Discharge Planning Services: CM Consult, Follow-up appt scheduled Post Acute Care Choice: Home Health, Durable Medical Equipment Living arrangements for the past 2 months: Single Family Home                 DME Arranged: Walker rolling, Oxygen, 3-N-1 DME Agency: [facility] Date DME Agency Contacted: 04/10/20 Time DME Agency Contacted: 1511 Representative spoke with at DME Agency: Ian Malkin HH Arranged: PT, OT HH Agency: [facility] Date HH Agency Contacted: 04/10/20 Time HH Agency Contacted: 1512 Representative spoke with at [facility] Agency: Kensey  Prior Living Arrangements/Services Living arrangements for the past 2 months: Single Family Home Lives with:: Self, Significant Other Patient language and need for interpreter reviewed:: Yes Do you feel safe going back to the place where you live?: Yes      Need for Family Participation in Patient Care: Yes (Comment) Care giver support system in place?: Yes (comment)   Criminal Activity/Legal Involvement Pertinent to Current Situation/Hospitalization: No - Comment as needed  Activities of Daily Living Home Assistive Devices/Equipment: None ADL Screening (condition at time of admission) Patient's cognitive ability adequate to safely complete daily activities?: Yes Is the patient deaf or have difficulty hearing?: No Does the patient have difficulty seeing, even when wearing glasses/contacts?: No Does the patient have difficulty concentrating, remembering, or making decisions?: No Patient able to express need for assistance with ADLs?: No  Does the patient have difficulty dressing or bathing?: No Independently performs ADLs?: Yes (appropriate for developmental age) Does the patient have difficulty walking or climbing stairs?: No Weakness of Legs: None Weakness of Arms/Hands: None  Permission Sought/Granted Permission sought to share information with : Passenger transport manager granted to share information with : Yes, Verbal Permission Granted     Permission granted to share info w AGENCY: HH/DME        Emotional Assessment Appearance:: Appears older than stated age Attitude/Demeanor/Rapport: Engaged Affect (typically observed): Appropriate, Pleasant Orientation: : Oriented to Self, Oriented to Place, Oriented to  Time, Oriented to Situation   Psych Involvement: No (comment)  Admission diagnosis:  Pulmonary embolism (HCC) [I26.99] Chest pain [R07.9] Pulmonary embolism associated with COVID-19 (HCC) [U07.1, I26.99] COVID [U07.1] COVID-19 [U07.1] Patient Active Problem List   Diagnosis Date Noted  . COVID-19 virus infection 04/02/2020  . Pulmonary embolism (HCC) 04/02/2020  . Morbid obesity (HCC)   . Abnormal Pap smear of vagina 01/31/2016   PCP:  Patient, No Pcp Per Pharmacy:   [id number] [place], [place] - [address] [facility] [address] [place] [address] Phone: [phone] Fax: [phone]  [facility] [place], [place] - [address] [place] [address] Phone: [phone] Fax: [phone]     Social Determinants of Health (SDOH) Interventions    Readmission Risk Interventions Readmission Risk Prevention Plan 04/10/2020  Post Dischage Appt Complete  Medication Screening Complete  Transportation Screening Complete  Some recent data might be hidden

## 2020-04-11 ENCOUNTER — Other Ambulatory Visit (HOSPITAL_COMMUNITY): Payer: Self-pay | Admitting: Internal Medicine

## 2020-04-11 LAB — COMPREHENSIVE METABOLIC PANEL
ALT: 38 U/L (ref 0–44)
AST: 12 U/L — ABNORMAL LOW (ref 15–41)
Albumin: 2.6 g/dL — ABNORMAL LOW (ref 3.5–5.0)
Alkaline Phosphatase: 75 U/L (ref 38–126)
Anion gap: 11 (ref 5–15)
BUN: 19 mg/dL (ref 6–20)
CO2: 24 mmol/L (ref 22–32)
Calcium: 8.7 mg/dL — ABNORMAL LOW (ref 8.9–10.3)
Chloride: 102 mmol/L (ref 98–111)
Creatinine, Ser: 0.71 mg/dL (ref 0.44–1.00)
GFR, Estimated: >60 mL/min (ref >60)
Glucose, Bld: 274 mg/dL — ABNORMAL HIGH (ref 70–99)
Potassium: 3.6 mmol/L (ref 3.5–5.1)
Sodium: 137 mmol/L (ref 135–145)
Total Bilirubin: 0.2 mg/dL — ABNORMAL LOW (ref 0.3–1.2)
Total Protein: 5.7 g/dL — ABNORMAL LOW (ref 6.5–8.1)

## 2020-04-11 LAB — GLUCOSE, CAPILLARY: Glucose-Capillary: 98 mg/dL (ref 70–99)

## 2020-04-11 MED ORDER — APIXABAN 5 MG PO TABS: ORAL_TABLET | ORAL | 0 refills | Status: DC

## 2020-04-11 MED ORDER — METFORMIN HCL 500 MG PO TABS: 500.0000 mg | ORAL_TABLET | Freq: Two times a day (BID) | ORAL | 11 refills | Status: DC

## 2020-04-11 MED ORDER — ALBUTEROL SULFATE HFA 108 (90 BASE) MCG/ACT IN AERS: 2.0000 | INHALATION_SPRAY | Freq: Four times a day (QID) | RESPIRATORY_TRACT | 0 refills | Status: DC | PRN

## 2020-04-11 MED FILL — ELIQUIS 5 MG TABLET: 5 | 30 days supply | Qty: 64 | Fill #0

## 2020-04-11 MED FILL — metFORMIN HCL 500 MG TABS: 500 | 30 days supply | Qty: 60 | Fill #0

## 2020-04-11 MED FILL — ALBUTEROL SULFATE HFA 108 (: 108 (90 BAS | 25 days supply | Qty: 18 | Fill #0

## 2020-04-11 NOTE — TOC Transition Note (Addendum)
Transition of Care (TOC) - CM/SW Discharge Note Donn Pierini RN, BSN Transitions of Care Unit 4E- RN Case Manager See Treatment Team for direct phone # Cross coverage for 5W   Patient Details  Name: Faith [name] MRN: [id number] Date of Birth: 01-06-1991  Transition of Care [facility]) CM/SW Contact:  Darrold Span, RN Phone Number: 04/11/2020, 2:34 PM   Clinical Narrative:    Pt stable for transition home today, Orders for [facility] and DME have been placed. Pt is anxious about returning home without home 02- MD has agreed for home 02- short term. Per conversation with pt yesterday referral for DME needs called to [facility] for both RW/3n1 and home 02- 3n1 and portable 02 will be delivered to unit. Per [facility] they are out of youth walkers and after speaking with pt will drop ship walker to her home.  Have printed copay assist card for Eliquis to the unit for staff to give to pt- [facility] pharmacy to fill meds including 30 day free Eliquis here prior to discharge.   Call made to pt via bedside phone to review what has been set up for pt for DME needs- and HH- [facility] f[facility] for [facility] needs- pt will have copay- which was explained to pt and also [facility] will go over with pt.  Call made to Dr. Maryfrances Bunnell administrator on call for [facility] to ask if he would cosign [facility] orders until pt is seen in f/u at post covid clinic on 12/20- Dr. Maryfrances Bunnell confirmed he is willing to sign any HH orders needed.- notified Kensey with [facility].   Pt to return home with family, will be ready for discharge once meds have been delivered and DME has arrived.    Final next level of care: Home w Home Health Services Barriers to Discharge: Barriers Resolved   Patient Goals and CMS Choice Patient states their goals for this hospitalization and ongoing recovery are:: return home CMS Medicare.gov Compare Post Acute Care list provided to:: Patient Choice offered to / list presented to : Patient  Discharge Placement                Home with [facility]        Discharge Plan and Services   Discharge Planning Services: CM Consult, Follow-up appt scheduled Post Acute Care Choice: Home Health, Durable Medical Equipment          DME Arranged: Walker rolling, Oxygen, 3-N-1 DME Agency: [facility] Date DME Agency Contacted: 04/11/20 Time DME Agency Contacted: 1215 Representative spoke with at DME Agency: Silvio Pate HH Arranged: PT, OT HH Agency: [facility] Date HH Agency Contacted: 04/10/20 Time HH Agency Contacted: 1512 Representative spoke with at [facility] Agency: Kense[facility]  Social Determinants of Health (SDOH) Interventions     Readmission Risk Interventions Readmission Risk Prevention Plan 04/10/2020  Post Dischage Appt Complete  Medication Screening Complete  Transportation Screening Complete  Some recent data might be hidden

## 2020-04-11 NOTE — Progress Notes (Signed)
Nsg Discharge Note  Admit Date:  04/02/2020 Discharge date: 04/11/2020   Faith [name] to be D/C'd Home per MD order.  AVS completed. Discharge Medication: Allergies as of 04/11/2020   No Known Allergies     Medication List    STOP taking these medications   amoxicillin 875 MG tablet Commonly known as: AMOXIL   ibuprofen 600 MG tablet Commonly known as: ADVIL   NYQUIL PO     TAKE these medications   acetaminophen 500 MG tablet Commonly known as: TYLENOL Take 500 mg by mouth every 6 (six) hours as needed for mild pain.   albuterol 108 (90 Base) MCG/ACT inhaler Commonly known as: VENTOLIN HFA Inhale 2 puffs into the lungs every 6 (six) hours as needed for wheezing or shortness of breath.   Apixaban Starter Pack (10mg  and 5mg ) Commonly known as: ELIQUIS STARTER PACK Take as directed on package   apixaban 5 MG Tabs tablet Commonly known as: ELIQUIS Take 2 tablets (10 mg) by mouth 2 times a day for 2 days then take 1 tablet (5 mg) by 2 times a day.   metFORMIN 500 MG tablet Commonly known as: Glucophage Take 1 tablet (500 mg total) by mouth 2 (two) times daily with a meal.            Durable Medical Equipment  (From admission, onward)         Start     Ordered   04/11/20 1240  For home use only DME oxygen  Once       Question Answer Comment  Length of Need 6 Months   Mode or (Route) Nasal cannula   Liters per Minute 2   Frequency Continuous (stationary and portable oxygen unit needed)   Oxygen conserving device No   Oxygen delivery system Gas      04/11/20 1239   04/11/20 1145  For home use only DME 3 n 1  Once        04/11/20 1144   04/11/20 1145  For home use only DME Bedside commode  Once       Question:  Patient needs a bedside commode to treat with the following condition  Answer:  Weakness   04/11/20 1144   04/11/20 1144  For home use only DME Walker rolling  Once       Comments: 5 wheel  Question Answer Comment  Walker: With 5 Inch  Wheels   Patient needs a walker to treat with the following condition Weakness      04/11/20 1144          Discharge Assessment: Vitals:   04/11/20 0441 04/11/20 0832  BP: 123/80   Pulse: 86   Resp: (!) 21   Temp: 98.2 F (36.8 C)   SpO2: 100% 100%   Skin clean, dry and intact without evidence of skin break down, no evidence of skin tears noted. IV catheter discontinued intact. Site without signs and symptoms of complications - no redness or edema noted at insertion site, patient denies c/o pain - only slight tenderness at site.  Dressing with slight pressure applied.  D/c Instructions-Education: Discharge instructions given to patient/family with verbalized understanding. D/c education completed with patient/family including follow up instructions, medication list, d/c activities limitations if indicated, with other d/c instructions as indicated by MD - patient able to verbalize understanding, all questions fully answered. Patient instructed to return to ED, call 911, or call MD for any changes in condition.  Patient escorted via WC,  and D/C home via private auto.  Lyndal Pulley, RN 04/11/2020 2:55 PM

## 2020-04-11 NOTE — Discharge Summary (Signed)
Faith [name] GXQ:[id number] DOB: 1990-10-07 DOA: 04/02/2020  PCP: Patient, No Pcp Per  Admit date: 04/02/2020  Discharge date: 04/11/2020  Admitted From: Home   Disposition:  Home   Recommendations for Outpatient Follow-up:   Follow up with PCP in 1-2 weeks  PCP Please obtain BMP/CBC, 2 view CXR in 1week,  (see Discharge instructions)   PCP Please follow up on the following pending results:    Home Health: None   Equipment/Devices: 02 if qualifies  Consultations: None  Discharge Condition: Stable    CODE STATUS: Full    Diet Recommendation: Heart Healthy Low Carb  Diet Order            Diet Carb Modified Fluid consistency: Thin; Room service appropriate? Yes  Diet effective now                  Chief Complaint  Patient presents with  . Chest Pain  . Back Pain     Brief history of present illness from the day of admission and additional interim summary    Faith [name] a 29 y.o.femalewith medical history significant ofessential hypertension not on any medication, morbid obesity, who was diagnosed with COVID-19 pneumonia on 03/23/20 -came to the hospital with chest pain.  In the ER was diagnosed with subsegmental PE along with COVID-19 pneumonia on CT chest and admitted to the hospital.                                                                 Hospital Course    1. Acute hypoxic respiratory failure due to acute bilateral subsegmental PE with acute COVID-19 pneumonia  - she is unfortunately not vaccinated, he had incurred severe parenchymal lung injury along with PE.  She was treated with combination of IV steroids, Remdesivir and Baricitinib. She is now stable on room air, ambulated in the hallway in front of me on room air and tolerated well, she is mildly deconditioned  but overall good. Ambulation on room air pulse ox was 96, at rest on room air she was 100% pulse ox.  Will be discharged home on a rescue inhaler, for her PE she will get Eliquis. Follow with PCP within a week.    Recent Labs  Lab 04/05/20 0043 04/05/20 0043 04/06/20 0148 04/06/20 0148 04/07/20 0343 04/08/20 0424 04/09/20 0406 04/10/20 0036 04/11/20 0050  WBC 18.2*  --  17.6*  --  17.9*  --   --   --   --   CRP 14.4*  --  5.4*  --  2.1*  --   --   --   --   DDIMER 4.49*  --  5.12*  --  2.49*  --   --   --   --   PROCALCITON 0.21  --   --   --   --   --   --   --   --  AST 87*   < > 38   < > 21 19 13* 21 12*  ALT 109*   < > 119*   < > 96* 76* 59* 47* 38  ALKPHOS 81   < > 71   < > 71 83 85 66 75  BILITOT 0.3   < > 0.6   < > 0.3 <0.1* 0.3 0.4 0.2*  ALBUMIN 2.8*   < > 2.7*   < > 2.7* 2.7* 2.7* 2.7* 2.6*   < > = values in this interval not displayed.    2. Morbid obesity BMI is 43. Follow with PCP for weight loss.  3. Prediabetes. A1c 6.4. Follow with PCP. Will place her on Metformin upon discharge   Discharge diagnosis     Principal Problem:   Pulmonary embolism (HCC) Active Problems:   Morbid obesity (HCC)   COVID-19 virus infection    Discharge instructions    Discharge Instructions    Discharge instructions   Complete by: As directed    Follow with Primary MD  in 7 days   Get CBC, CMP, 2 view Chest X ray -  checked next visit within 1 week by Primary MD   Activity: As tolerated with Full fall precautions use walker/cane & assistance as needed  Disposition Home    Diet: Heart Healthy Low Carb  Special Instructions: If you have smoked or chewed Tobacco  in the last 2 yrs please stop smoking, stop any regular Alcohol  and or any Recreational drug use.  On your next visit with your primary care physician please Get Medicines reviewed and adjusted.  Please request your Prim.MD to go over all Hospital Tests and Procedure/Radiological results at the follow up,  please get all Hospital records sent to your Prim MD by signing hospital release before you go home.  If you experience worsening of your admission symptoms, develop shortness of breath, life threatening emergency, suicidal or homicidal thoughts you must seek medical attention immediately by calling 911 or calling your MD immediately  if symptoms less severe.  You Must read complete instructions/literature along with all the possible adverse reactions/side effects for all the Medicines you take and that have been prescribed to you. Take any new Medicines after you have completely understood and accpet all the possible adverse reactions/side effects.   Increase activity slowly   Complete by: As directed    MyChart COVID-19 home monitoring program   Complete by: Apr 11, 2020    Is the patient willing to use the MyChart Mobile App for home monitoring?: Yes   Temperature monitoring   Complete by: Apr 11, 2020    After how many days would you like to receive a notification of this patient's flowsheet entries?: 1      Discharge Medications   Allergies as of 04/11/2020   No Known Allergies     Medication List    STOP taking these medications   amoxicillin 875 MG tablet Commonly known as: AMOXIL   ibuprofen 600 MG tablet Commonly known as: ADVIL   NYQUIL PO     TAKE these medications   acetaminophen 500 MG tablet Commonly known as: TYLENOL Take 500 mg by mouth every 6 (six) hours as needed for mild pain.   albuterol 108 (90 Base) MCG/ACT inhaler Commonly known as: VENTOLIN HFA Inhale 2 puffs into the lungs every 6 (six) hours as needed for wheezing or shortness of breath.   Apixaban Starter Pack (  and ) Commonly known as: ELIQUIS  STARTER PACK Take as directed on package   apixaban 5 MG Tabs tablet Commonly known as: ELIQUIS Take 2 tablets (10 mg) by mouth 2 times a day for 2 days then take 1 tablet (5 mg) by 2 times a day.   metFORMIN 500 MG tablet Commonly known as:  Glucophage Take 1 tablet (500 mg total) by mouth 2 (two) times daily with a meal.            Durable Medical Equipment  (From admission, onward)         Start     Ordered   04/10/20 1016  For home use only DME oxygen  Once       Question Answer Comment  Length of Need 6 Months   Mode or (Route) Nasal cannula   Liters per Minute 3   Frequency Continuous (stationary and portable oxygen unit needed)   Oxygen conserving device Yes   Oxygen delivery system Gas      04/10/20 1016           Follow-up Information    [facility]. Go on 04/23/2020.   Why: Please go to this follow up appointment at 2pm. They will help set you up with a primary care provider.  Contact information: [address] [place] [address] [phone]       [facility] Follow up.   Why: Home 02 arranged- portable equipment to be delivered to nursing station on unit- they will contact you regarding home 02 needs Contact information: [address] [place] [address] [phone]        [facility] Follow up.   Why: HHPT/OT (vestibular rehab PT) arranged- they will contact you to set up home visits Contact information: [address] [place] [address] [phone]               Major procedures and Radiology Reports - PLEASE review detailed and final reports thoroughly  -       DG Chest 1 View  Result Date: 04/02/2020 CLINICAL DATA:  Shortness of breath, cough and low back pain. The patient tested positive for COVID-19 10 days ago. EXAM: CHEST  1 VIEW COMPARISON:  PA and lateral chest 03/15/2018. Single-view of the chest 03/23/2020. FINDINGS: Lung volumes are low. Streaky bibasilar airspace opacities are greater on the left and unchanged. No pneumothorax or pleural effusion. Heart size is normal. No acute or focal bony abnormality. IMPRESSION: Left greater than right streaky opacities are  unchanged since the most recent examination and could be due to pneumonia or atelectasis. Electronically Signed   By: Drusilla Kanner M.D.   On: 04/02/2020 11:50   CT Angio Chest PE W and/or Wo Contrast  Result Date: 04/02/2020 CLINICAL DATA:  Chest and back pain since yesterday, COVID-19 positive 10 days ago EXAM: CT ANGIOGRAPHY CHEST WITH CONTRAST TECHNIQUE: Multidetector CT imaging of the chest was performed using the standard protocol during bolus administration of intravenous contrast. Multiplanar CT image reconstructions and MIPs were obtained to evaluate the vascular anatomy. CONTRAST:  51mL OMNIPAQUE IOHEXOL 350 MG/ML SOLN COMPARISON:  04/02/2020 FINDINGS: Cardiovascular: This is a technically adequate evaluation of the pulmonary vasculature. There are bilateral segmental pulmonary emboli, greatest in the lower lobes. There is moderate clot burden, but no evidence of right heart strain. No pericardial effusion.  Thoracic aorta is unremarkable. Mediastinum/Nodes: No enlarged mediastinal, hilar, or axillary lymph nodes. Thyroid gland, trachea, and esophagus demonstrate no significant  findings. Lungs/Pleura: Patchy bilateral perihilar ground-glass airspace disease consistent with multifocal pneumonia and given history of COVID-19. Areas of denser consolidation within the dependent lower lobes compatible with atelectasis or infarct. Trace right pleural effusion. No pneumothorax. Central airways are patent. Upper Abdomen: No acute abnormality. Musculoskeletal: No acute or destructive bony lesions. Reconstructed images demonstrate no additional findings. Review of the MIP images confirms the above findings. IMPRESSION: 1. Bilateral segmental pulmonary emboli, with no evidence of right heart strain. 2. Patchy bilateral perihilar ground-glass airspace disease consistent with multifocal pneumonia, compatible with history of COVID 19. 3. Trace right pleural effusion. 4. Bibasilar areas of consolidation  consistent with atelectasis or developing pulmonary infarct. Critical Value/emergent results were called by telephone at the time of interpretation on 04/02/2020 at 9:09 pm to provider Gerhard Munch , who verbally acknowledged these results. Electronically Signed   By: Sharlet Salina M.D.   On: 04/02/2020 21:10   DG Chest Port 1 View  Result Date: 04/05/2020 CLINICAL DATA:  COVID-19 infection. Worsening hypoxia and shortness of breath. EXAM: PORTABLE CHEST 1 VIEW COMPARISON:  CT and chest radiographs, 04/02/2020. FINDINGS: Lung base opacities are without change from the prior exam. No new lung abnormalities. Lung volumes low. No visualized pleural effusion.  No pneumothorax. Cardiac silhouette is normal in size. No mediastinal or hilar masses. IMPRESSION: 1. Lung base opacities are unchanged from the most recent prior chest radiograph. The more subtle areas of ground-glass opacity noted on the prior chest CT are not resolved radiographically. No new abnormalities. Electronically Signed   By: Amie Portland M.D.   On: 04/05/2020 15:14   DG Chest Portable 1 View  Result Date: 03/23/2020 CLINICAL DATA:  COVID EXAM: PORTABLE CHEST 1 VIEW COMPARISON:  March 15, 2018 FINDINGS: The heart size and mediastinal contours are within normal limits. Hazy airspace opacity seen at the right lung base. The visualized skeletal structures are unremarkable. IMPRESSION: Hazy airspace opacity at the right lung base which likely due to infectious etiology. Electronically Signed   By: Jonna Clark M.D.   On: 03/23/2020 20:08   ECHOCARDIOGRAM COMPLETE  Result Date: 04/09/2020    ECHOCARDIOGRAM REPORT   Patient Name:   Faith [name] Date of Exam: 04/09/2020 Medical Rec #:  [id number]             Height:       61.0 in Accession #:    [id number]            Weight:       230.0 lb Date of Birth:  [birth year]             BSA:          2.004 m Patient Age:    29 years              BP:           125/70 mmHg Patient Gender: F                      HR:           77 bpm. Exam Location:  Inpatient Procedure: 2D Echo, Cardiac Doppler and Color Doppler Indications:    I26.02 Pulmonary embolus  History:        Patient has no prior history of Echocardiogram examinations.                 Signs/Symptoms:Shortness of Breath, Dyspnea and Chest Pain; Risk  Factors:Hypertension. Covid positive. Pulmonary embolus.  Sonographer:    Sheralyn Boatman RDCS Referring Phys: [id number] [address] S [facility]  Sonographer Comments: Technically difficult study due to poor echo windows and patient is morbidly obese. Image acquisition challenging due to patient body habitus. IMPRESSIONS  1. Left ventricular ejection fraction, by estimation, is 60 to 65%. Left ventricular ejection fraction by 2D MOD biplane is 60.1 %. The left ventricle has normal function. The left ventricle has no regional wall motion abnormalities. Left ventricular diastolic parameters were normal.  2. Right ventricular systolic function is normal. The right ventricular size is normal. Tricuspid regurgitation signal is inadequate for assessing PA pressure.  3. The mitral valve is grossly normal. No evidence of mitral valve regurgitation. No evidence of mitral stenosis.  4. The aortic valve is tricuspid. Aortic valve regurgitation is not visualized. No aortic stenosis is present.  5. The inferior vena cava is normal in size with greater than 50% respiratory variability, suggesting right atrial pressure of 3 mmHg. Conclusion(s)/Recommendation(s): Normal biventricular function without evidence of hemodynamically significant valvular heart disease. FINDINGS  Left Ventricle: Left ventricular ejection fraction, by estimation, is 60 to 65%. Left ventricular ejection fraction by 2D MOD biplane is 60.1 %. The left ventricle has normal function. The left ventricle has no regional wall motion abnormalities. The left ventricular internal cavity size was normal in size. There is no left ventricular hypertrophy.  Left ventricular diastolic parameters were normal. Normal left ventricular filling pressure. Right Ventricle: The right ventricular size is normal. No increase in right ventricular wall thickness. Right ventricular systolic function is normal. Tricuspid regurgitation signal is inadequate for assessing PA pressure. Left Atrium: Left atrial size was normal in size. Right Atrium: Right atrial size was normal in size. Pericardium: Trivial pericardial effusion is present. Mitral Valve: The mitral valve is grossly normal. No evidence of mitral valve regurgitation. No evidence of mitral valve stenosis. Tricuspid Valve: The tricuspid valve is grossly normal. Tricuspid valve regurgitation is not demonstrated. No evidence of tricuspid stenosis. Aortic Valve: The aortic valve is tricuspid. Aortic valve regurgitation is not visualized. No aortic stenosis is present. Pulmonic Valve: The pulmonic valve was grossly normal. Pulmonic valve regurgitation is not visualized. No evidence of pulmonic stenosis. Aorta: The aortic root and ascending aorta are structurally normal, with no evidence of dilitation. Venous: The right upper pulmonary vein is normal. The inferior vena cava is normal in size with greater than 50% respiratory variability, suggesting right atrial pressure of 3 mmHg. IAS/Shunts: The atrial septum is grossly normal.  LEFT VENTRICLE PLAX 2D                        Biplane EF (MOD) LVIDd:         4.40 cm         LV Biplane EF:   Left LVIDs:         2.30 cm                          ventricular LV PW:         1.10 cm                          ejection LV IVS:        1.04 cm                          fraction  by LVOT diam:     2.10 cm                          2D MOD LV SV:         74                               biplane is LV SV Index:   37                               60.1 %. LVOT Area:     3.46 cm                                Diastology                                LV e' medial:    9.79 cm/s LV Volumes (MOD)                LV E/e' medial:  9.1 LV vol d, MOD    78.8 ml       LV e' lateral:   9.90 cm/s A2C:                           LV E/e' lateral: 9.0 LV vol d, MOD    78.3 ml A4C: LV vol s, MOD    28.5 ml A2C: LV vol s, MOD    34.2 ml A4C: LV SV MOD A2C:   50.3 ml LV SV MOD A4C:   78.3 ml LV SV MOD BP:    47.1 ml IVC IVC diam: 1.40 cm LEFT ATRIUM             Index       RIGHT ATRIUM          Index LA diam:        2.80 cm 1.40 cm/m  RA Area:     9.08 cm LA Vol (A2C):   17.9 ml 8.93 ml/m  RA Volume:   13.80 ml 6.89 ml/m LA Vol (A4C):   20.9 ml 10.43 ml/m LA Biplane Vol: 20.2 ml 10.08 ml/m  AORTIC VALVE LVOT Vmax:   106.00 cm/s LVOT Vmean:  79.400 cm/s LVOT VTI:    0.214 m  AORTA Ao Root diam: 3.00 cm Ao Asc diam:  2.80 cm MITRAL VALVE MV Area (PHT): 4.31 cm    SHUNTS MV Decel Time: 176 msec    Systemic VTI:  0.21 m MV E velocity: 89.20 cm/s  Systemic Diam: 2.10 cm MV A velocity: 53.80 cm/s MV E/A ratio:  1.66 Lennie Odor MD Electronically signed by Lennie Odor MD Signature Date/Time: 04/09/2020/11:23:38 AM    Final    VAS [place] LOWER EXTREMITY VENOUS (DVT)  Result Date: 04/03/2020  Lower Venous DVT Study Indications: Elevated ddimer.  Limitations: Body habitus and poor ultrasound/tissue interface. Comparison Study: no prior Performing Technologist: Blanch Media RVS  Examination Guidelines: A complete evaluation includes B-mode imaging, spectral Doppler, color Doppler, and power Doppler as needed of all accessible portions of each vessel. Bilateral testing is considered an integral part of a complete examination. Limited examinations for reoccurring indications may be performed as noted.  The reflux portion of the exam is performed with the patient in reverse Trendelenburg.  +---------+---------------+---------+-----------+----------+--------------+ RIGHT    CompressibilityPhasicitySpontaneityPropertiesThrombus Aging +---------+---------------+---------+-----------+----------+--------------+ CFV      Full            Yes      Yes                                 +---------+---------------+---------+-----------+----------+--------------+ SFJ      Full                                                        +---------+---------------+---------+-----------+----------+--------------+ FV Prox  Full                                                        +---------+---------------+---------+-----------+----------+--------------+ FV Mid   Full                                                        +---------+---------------+---------+-----------+----------+--------------+ FV Distal               Yes      Yes                                 +---------+---------------+---------+-----------+----------+--------------+ PFV      Full                                                        +---------+---------------+---------+-----------+----------+--------------+ POP      Full           Yes      Yes                                 +---------+---------------+---------+-----------+----------+--------------+ PTV      Full                                                        +---------+---------------+---------+-----------+----------+--------------+ PERO     Full                                                        +---------+---------------+---------+-----------+----------+--------------+   +---------+---------------+---------+-----------+----------+--------------+ LEFT     CompressibilityPhasicitySpontaneityPropertiesThrombus Aging +---------+---------------+---------+-----------+----------+--------------+ CFV      Full           Yes  Yes                                 +---------+---------------+---------+-----------+----------+--------------+ SFJ      Full                                                        +---------+---------------+---------+-----------+----------+--------------+ FV Prox  Full                                                         +---------+---------------+---------+-----------+----------+--------------+ FV Mid                  Yes      Yes                                 +---------+---------------+---------+-----------+----------+--------------+ FV Distal               Yes      Yes                                 +---------+---------------+---------+-----------+----------+--------------+ PFV      Full                                                        +---------+---------------+---------+-----------+----------+--------------+ POP      Full           Yes      Yes                                 +---------+---------------+---------+-----------+----------+--------------+ PTV      Full                                                        +---------+---------------+---------+-----------+----------+--------------+ PERO     Full                                                        +---------+---------------+---------+-----------+----------+--------------+     Summary: BILATERAL: - No evidence of deep vein thrombosis seen in the lower extremities, bilaterally. - No evidence of superficial venous thrombosis in the lower extremities, bilaterally. -No evidence of popliteal cyst, bilaterally.   *See table(s) above for measurements and observations. Electronically signed by Waverly Ferrari MD on 04/03/2020 at 7:58:25 PM.    Final     Micro Results     No results found for this or any previous visit (from the  past 240 hour(s)).  Today   Subjective    Faith [name] today has no headache,no chest abdominal pain,no new weakness tingling or numbness, feels much better wants to go home today.     Objective   Blood pressure 123/80, pulse 86, temperature 98.2 F (36.8 C), temperature source Oral, resp. rate (!) 21, height 5\' 1"  (1.549 m), weight 104.3 kg, last menstrual period 03/26/2020, SpO2 100 %.   Intake/Output Summary (Last 24 hours) at 04/11/2020 1017 Last data filed at  04/11/2020 0441 Gross per 24 hour  Intake 720 ml  Output --  Net 720 ml    Exam  Awake Alert, No new F.N deficits, Normal affect [place].AT,PERRAL Supple Neck,No JVD, No cervical lymphadenopathy appriciated.  Symmetrical Chest wall movement, Good air movement bilaterally, CTAB RRR,No Gallops,Rubs or new Murmurs, No Parasternal Heave +ve B.Sounds, Abd Soft, Non tender, No organomegaly appriciated, No rebound -guarding or rigidity. No Cyanosis, Clubbing or edema, No new Rash or bruise   Data Review   CBC w Diff:  Lab Results  Component Value Date   WBC 17.9 (H) 04/07/2020   HGB 10.8 (L) 04/07/2020   HCT 35.0 (L) 04/07/2020   PLT 404 (H) 04/07/2020   LYMPHOPCT 12 04/07/2020   MONOPCT 4 04/07/2020   EOSPCT 0 04/07/2020   BASOPCT 0 04/07/2020    CMP:  Lab Results  Component Value Date   NA 137 04/11/2020   K 3.6 04/11/2020   CL 102 04/11/2020   CO2 24 04/11/2020   BUN 19 04/11/2020   CREATININE 0.71 04/11/2020   PROT 5.7 (L) 04/11/2020   ALBUMIN 2.6 (L) 04/11/2020   BILITOT 0.2 (L) 04/11/2020   ALKPHOS 75 04/11/2020   AST 12 (L) 04/11/2020   ALT 38 04/11/2020  .   Total Time in preparing paper work, data evaluation and todays exam - 35 minutes  Susa RaringPrashant Sheryl Towell M.D on 04/11/2020 at 10:17 AM  [facility]   Office  [phone]

## 2020-04-11 NOTE — Plan of Care (Signed)
  Problem: Coping: Goal: Level of anxiety will decrease Outcome: Progressing   Problem: Pain Managment: Goal: General experience of comfort will improve Outcome: Progressing   Problem: Activity: Goal: Risk for activity intolerance will decrease Outcome: Not Progressing   

## 2020-04-11 NOTE — Progress Notes (Signed)
SATURATION QUALIFICATIONS: (This note is used to comply with regulatory documentation for home oxygen)  Patient Saturations on Room Air at Rest = 100%  Patient Saturations on Room Air while Ambulating = 90 % DOE 3/4 requiring a seated rest.  HR 159 bpm  Patient Saturations on  Liters of oxygen while Ambulating = %--NT O2 stayed 90 or higher.   Please briefly explain why patient needs home oxygen: Pt's O2 sats are 90 or higher on RA.   Corinna Capra, PT, DPT  Acute Rehabilitation [phone] pager [phone] office

## 2020-04-11 NOTE — Progress Notes (Signed)
Physical Therapy Treatment Patient Details Name: Faith [name] MRN: [id number] DOB: 1990/08/17 Today's Date: 04/11/2020    History of Present Illness 29 y.o. female with medical history significant of essential hypertension not on any medication, morbid obesity, who was diagnosed with COVID-19 pneumonia about 10 days PTA on 04/02/20. Began having chest pain; + bil PEs. LE dopplers negative.    PT Comments    Pt was able to walk down the hallway with me to test O2 for a longer distance.  See separate O2 note for details.  Despite DOE 3/4, her O2 remained 90 (lowest observed) or higher throughout her slow walk.  HR max observed was 159 bpm.  Pt took three standing and one seated rest break due to reports of DOE.  Walker used to help conserve her energy during gait and for balance as she continues to report feeling dizzy when moving.  She reports compliance (yesterday, but not today) with x1 exercises and PT continues to recommend home (vestibular if able) therapy at discharge.   PT to follow acutely for deficits listed below.    Follow Up Recommendations  Home health PT;Other (comment) (for vestibular therapy)     Equipment Recommendations  Rolling walker with 5" wheels    Recommendations for Other Services       Precautions / Restrictions Precautions Precautions: Fall;Other (comment) Precaution Comments: monitor HR and O2 sats, mildly dizzy    Mobility  Bed Mobility Overal bed mobility: Independent                Transfers Overall transfer level: Needs assistance Equipment used: Rolling walker (2 wheeled) Transfers: Sit to/from Stand Sit to Stand: Supervision         General transfer comment: supervision for safety  Ambulation/Gait Ambulation/Gait assistance: Supervision Gait Distance (Feet): 100 Feet Assistive device: Rolling walker (2 wheeled) Gait Pattern/deviations: Step-through pattern;Shuffle Gait velocity: decreased Gait velocity interpretation:  <1.31 ft/sec, indicative of household ambulator General Gait Details: pt continues to prefer RW for balance and energy conservation during gait.  Speed is very slow, HR up from 100s to 159 highest observed during gait.  She took three standing rest breaks, and finally a sitting rest break due to 3/4 DOE.  O2 sats were 90 or better on RA during gait despite DOE.  Relayed sats and HR to MD and RN case manager.    Stairs             Wheelchair Mobility    Modified Rankin (Stroke Patients Only)       Balance Overall balance assessment: Mild deficits observed, not formally tested (still reporting some dizziness)                                          Cognition Arousal/Alertness: Awake/alert Behavior During Therapy: Anxious Overall Cognitive Status: Within Functional Limits for tasks assessed                                 General Comments: Pt with increased dyspnea with activity, increasing anxiety related oxygen use.      Exercises Other Exercises Other Exercises: Pt reports compliance yesterday with x1 exercises, but not today as she has been busy getting ready for d/c.    General Comments General comments (skin integrity, edema, etc.): Pt on 2 L O2 [place] when  I entered the room. Sat EOB with none for ~5 mins while I collected supplies, O2 sats 99%, ambulated 100' on RA O2 sats lowest observed were 90%, DOE 3/4 and HR up to 159 bpm.  Pt had to take three standing rest breaks (HR would dip back down to 130s during standing rest) and finally one steated rest break on the return trip due to reports of dyspnea.  We talked while resting about how her HR increase and PE dx could be causing her to feel SOB.  She verbalized fear of returning home without oxygen. She does have a finger pulse ox at home.  I discussed with her normal O2 levels and what would be a sign of her getting worse (O2 trending to the mid 80s with activity or at rest, increasing recover  times from bouts of activity).  2 L O2 [place] re-applied at end of session (she was on it when I came in, so I re-applied it as she was before).      Pertinent Vitals/Pain Pain Assessment: No/denies pain    Home Living                      Prior Function            PT Goals (current goals can now be found in the care plan section) Acute Rehab PT Goals Patient Stated Goal: to get home to her kids and get better, stay out of the hospital. Progress towards PT goals: Progressing toward goals    Frequency    Min 4X/week      PT Plan Current plan remains appropriate    Co-evaluation              AM-PAC PT "6 Clicks" Mobility   Outcome Measure  Help needed turning from your back to your side while in a flat bed without using bedrails?: None Help needed moving from lying on your back to sitting on the side of a flat bed without using bedrails?: None Help needed moving to and from a bed to a chair (including a wheelchair)?: A Little Help needed standing up from a chair using your arms (e.g., wheelchair or bedside chair)?: A Little Help needed to walk in hospital room?: A Little Help needed climbing 3-5 steps with a railing? : A Little 6 Click Score: 20    End of Session Equipment Utilized During Treatment: Oxygen Activity Tolerance: Other (comment) (limited by DOE, dizziness.) Patient left: in bed;with call bell/phone within reach   PT Visit Diagnosis: Unsteadiness on feet (R26.81);Difficulty in walking, not elsewhere classified (R26.2);Dizziness and giddiness (R42)     Time: 3419-6222 PT Time Calculation (min) (ACUTE ONLY): 40 min  Charges:  $Gait Training: 23-37 mins $Self Care/Home Management: 8-22                    Corinna Capra, PT, DPT  Acute Rehabilitation [phone] pager [phone]) [phone] office

## 2020-04-11 NOTE — Progress Notes (Signed)
SATURATION QUALIFICATIONS: (This note is used to comply with regulatory documentation for home oxygen)  Patient Saturations on Room Air at Rest = 100%  Patient Saturations on Room Air while Ambulating = 96%  Patient does not qualify for home oxygen.

## 2020-04-23 ENCOUNTER — Ambulatory Visit (INDEPENDENT_AMBULATORY_CARE_PROVIDER_SITE_OTHER): Payer: 59 | Admitting: Nurse Practitioner

## 2020-04-23 ENCOUNTER — Other Ambulatory Visit: Payer: Self-pay

## 2020-04-23 VITALS — HR 113 | Temp 97.1°F

## 2020-04-23 DIAGNOSIS — Z8616 Personal history of COVID-19: Secondary | ICD-10-CM | POA: Diagnosis not present

## 2020-04-23 DIAGNOSIS — I2694 Multiple subsegmental pulmonary emboli without acute cor pulmonale: Secondary | ICD-10-CM

## 2020-04-23 DIAGNOSIS — U071 COVID-19: Secondary | ICD-10-CM | POA: Diagnosis not present

## 2020-04-23 DIAGNOSIS — R000 Tachycardia, unspecified: Secondary | ICD-10-CM

## 2020-04-23 DIAGNOSIS — J1282 Pneumonia due to coronavirus disease 2019: Secondary | ICD-10-CM

## 2020-04-23 MED ORDER — ONDANSETRON 4 MG PO TBDP: 4.0000 mg | ORAL_TABLET | Freq: Three times a day (TID) | ORAL | 0 refills | Status: DC | PRN

## 2020-04-23 NOTE — Progress Notes (Signed)
  ID: Faith [name], female    DOB: [birth year], 29 y.o.   MRN: [id number]  Chief Complaint  Patient presents with  . Hospitalization Follow-up    Slowly improving     Referring provider: No ref. provider found   29 year old female with history of hypertension and obesity.  HPI  Patient presents today for post COVID care clinic visit/hospital follow-up.  Patient was admitted to the hospital on 04/02/2020 through 04/11/2020.  Patient was admitted for acute hypoxic respiratory failure and Covid pneumonia.  Patient was found to have bilateral subsegmental PEs.  Patient was also noted to be prediabetic with A1c of 6.4.  She was discharged home on Metformin.  Patient states that she has been doing well and slowly improving since hospital discharge.  She is walking with a walker today and is on 2 L of O2 continuous.  Her O2 sats are staying in the upper 90s and we discussed that she can wean herself down to 1 L with exertion and room air at rest as long as O2 sats remained above 92%.  Patient does have home PT coming out to work with her.  Patient states that her heart has been racing since hospital discharge.  Her heart rate stays around 120.  Her heart rate was between 113-120 in the office today.  We discussed referral to cardiology.  Patient states that Metformin is causing nausea and diarrhea.  We will get her an appointment set up to establish care with a new PCP to manage her diabetes.  We discussed that she can cut back to 1 Metformin in the morning for nail.  We will order Zofran. Denies f/c/s, n/v/d, hemoptysis, PND, chest pain or edema.      No Known Allergies  There is no immunization history for the selected administration types on file for this patient.  Past Medical History:  Diagnosis Date  . Chlamydia   . Hypertension   . Infection    UTI  . Morbid obesity (HCC)   . Urinary tract infection   . Vaginal Pap smear, abnormal    f/u was ok    Tobacco  History: Social History   Tobacco Use  Smoking Status Former Smoker  . Packs/day: 0.25  . Quit date: 04/05/2015  . Years since quitting: 5.0  Smokeless Tobacco Never Used   Counseling given: Yes   Outpatient Encounter Medications as of 04/23/2020  Medication Sig  . acetaminophen (TYLENOL) 500 MG tablet Take 500 mg by mouth every 6 (six) hours as needed for mild pain.   Marland Kitchen albuterol (VENTOLIN HFA) 108 (90 Base) MCG/ACT inhaler Inhale 2 puffs into the lungs every 6 (six) hours as needed for wheezing or shortness of breath.  Marland Kitchen apixaban (ELIQUIS) 5 MG TABS tablet Take 2 tablets (10 mg) by mouth 2 times a day for 2 days then take 1 tablet (5 mg) by 2 times a day.  . APIXABAN (ELIQUIS) VTE STARTER PACK (  AND ) Take as directed on package  . metFORMIN (GLUCOPHAGE) 500 MG tablet Take 1 tablet (500 mg total) by mouth 2 (two) times daily with a meal.  . ondansetron (ZOFRAN ODT) 4 MG disintegrating tablet Take 1 tablet (4 mg total) by mouth every 8 (eight) hours as needed for nausea or vomiting.   No facility-administered encounter medications on file as of 04/23/2020.     Review of Systems  Review of Systems  Constitutional: Positive for fatigue. Negative for fever.  HENT: Negative.   Respiratory:  Positive for shortness of breath. Negative for cough.   Cardiovascular: Negative.  Negative for chest pain, palpitations and leg swelling.  Gastrointestinal: Negative.   Allergic/Immunologic: Negative.   Neurological: Negative.   Psychiatric/Behavioral: Negative.        Physical Exam  Pulse (!) 113   Temp (!) 97.1 F (36.2 C)   LMP 03/26/2020 Comment: NEGATIVE HCG IN ED  SpO2 99% Comment: 2 L  Wt Readings from Last 5 Encounters:  04/02/20 230 lb (104.3 kg)  02/15/18 216 lb (98 kg)  05/25/17 234 lb (106.1 kg)  12/11/16 221 lb 9.6 oz (100.5 kg)  11/26/16 223 lb (101.2 kg)     Physical Exam Vitals and nursing note reviewed.  Constitutional:      General: She is not in  acute distress.    Appearance: She is well-developed and well-nourished.  Cardiovascular:     Rate and Rhythm: Normal rate and regular rhythm.  Pulmonary:     Effort: Pulmonary effort is normal.     Breath sounds: Normal breath sounds.  Musculoskeletal:     Right lower leg: No edema.     Left lower leg: No edema.  Neurological:     Mental Status: She is alert and oriented to person, place, and time.  Psychiatric:        Mood and Affect: Mood and affect and mood normal.        Behavior: Behavior normal.       Imaging: DG Chest 1 View  Result Date: 04/02/2020 CLINICAL DATA:  Shortness of breath, cough and low back pain. The patient tested positive for COVID-19 10 days ago. EXAM: CHEST  1 VIEW COMPARISON:  PA and lateral chest 03/15/2018. Single-view of the chest 03/23/2020. FINDINGS: Lung volumes are low. Streaky bibasilar airspace opacities are greater on the left and unchanged. No pneumothorax or pleural effusion. Heart size is normal. No acute or focal bony abnormality. IMPRESSION: Left greater than right streaky opacities are unchanged since the most recent examination and could be due to pneumonia or atelectasis. Electronically Signed   By: Drusilla Kanner M.D.   On: 04/02/2020 11:50   CT Angio Chest PE W and/or Wo Contrast  Result Date: 04/02/2020 CLINICAL DATA:  Chest and back pain since yesterday, COVID-19 positive 10 days ago EXAM: CT ANGIOGRAPHY CHEST WITH CONTRAST TECHNIQUE: Multidetector CT imaging of the chest was performed using the standard protocol during bolus administration of intravenous contrast. Multiplanar CT image reconstructions and MIPs were obtained to evaluate the vascular anatomy. CONTRAST:  58mL OMNIPAQUE IOHEXOL 350 MG/ML SOLN COMPARISON:  04/02/2020 FINDINGS: Cardiovascular: This is a technically adequate evaluation of the pulmonary vasculature. There are bilateral segmental pulmonary emboli, greatest in the lower lobes. There is moderate clot burden, but no  evidence of right heart strain. No pericardial effusion.  Thoracic aorta is unremarkable. Mediastinum/Nodes: No enlarged mediastinal, hilar, or axillary lymph nodes. Thyroid gland, trachea, and esophagus demonstrate no significant findings. Lungs/Pleura: Patchy bilateral perihilar ground-glass airspace disease consistent with multifocal pneumonia and given history of COVID-19. Areas of denser consolidation within the dependent lower lobes compatible with atelectasis or infarct. Trace right pleural effusion. No pneumothorax. Central airways are patent. Upper Abdomen: No acute abnormality. Musculoskeletal: No acute or destructive bony lesions. Reconstructed images demonstrate no additional findings. Review of the MIP images confirms the above findings. IMPRESSION: 1. Bilateral segmental pulmonary emboli, with no evidence of right heart strain. 2. Patchy bilateral perihilar ground-glass airspace disease consistent with multifocal pneumonia, compatible with history of COVID  19. 3. Trace right pleural effusion. 4. Bibasilar areas of consolidation consistent with atelectasis or developing pulmonary infarct. Critical Value/emergent results were called by telephone at the time of interpretation on 04/02/2020 at 9:09 pm to provider Gerhard Munch , who verbally acknowledged these results. Electronically Signed   By: Sharlet Salina M.D.   On: 04/02/2020 21:10   DG Chest Port 1 View  Result Date: 04/05/2020 CLINICAL DATA:  COVID-19 infection. Worsening hypoxia and shortness of breath. EXAM: PORTABLE CHEST 1 VIEW COMPARISON:  CT and chest radiographs, 04/02/2020. FINDINGS: Lung base opacities are without change from the prior exam. No new lung abnormalities. Lung volumes low. No visualized pleural effusion.  No pneumothorax. Cardiac silhouette is normal in size. No mediastinal or hilar masses. IMPRESSION: 1. Lung base opacities are unchanged from the most recent prior chest radiograph. The more subtle areas of ground-glass  opacity noted on the prior chest CT are not resolved radiographically. No new abnormalities. Electronically Signed   By: Amie Portland M.D.   On: 04/05/2020 15:14   ECHOCARDIOGRAM COMPLETE  Result Date: 04/09/2020    ECHOCARDIOGRAM REPORT   Patient Name:   MATTESON BLUE Date of Exam: 04/09/2020 Medical Rec #:  [id number]             Height:       61.0 in Accession #:    [id number]            Weight:       230.0 lb Date of Birth:  13-May-1990             BSA:          2.004 m Patient Age:    29 years              BP:           125/70 mmHg Patient Gender: F                     HR:           77 bpm. Exam Location:  Inpatient Procedure: 2D Echo, Cardiac Doppler and Color Doppler Indications:    I26.02 Pulmonary embolus  History:        Patient has no prior history of Echocardiogram examinations.                 Signs/Symptoms:Shortness of Breath, Dyspnea and Chest Pain; Risk                 Factors:Hypertension. Covid positive. Pulmonary embolus.  Sonographer:    Sheralyn Boatman RDCS Referring Phys: [id number] DAWOOD S [facility]  Sonographer Comments: Technically difficult study due to poor echo windows and patient is morbidly obese. Image acquisition challenging due to patient body habitus. IMPRESSIONS  1. Left ventricular ejection fraction, by estimation, is 60 to 65%. Left ventricular ejection fraction by 2D MOD biplane is 60.1 %. The left ventricle has normal function. The left ventricle has no regional wall motion abnormalities. Left ventricular diastolic parameters were normal.  2. Right ventricular systolic function is normal. The right ventricular size is normal. Tricuspid regurgitation signal is inadequate for assessing PA pressure.  3. The mitral valve is grossly normal. No evidence of mitral valve regurgitation. No evidence of mitral stenosis.  4. The aortic valve is tricuspid. Aortic valve regurgitation is not visualized. No aortic stenosis is present.  5. The inferior vena cava is normal in size with greater  than 50% respiratory variability, suggesting right atrial pressure of  3 mmHg. Conclusion(s)/Recommendation(s): Normal biventricular function without evidence of hemodynamically significant valvular heart disease. FINDINGS  Left Ventricle: Left ventricular ejection fraction, by estimation, is 60 to 65%. Left ventricular ejection fraction by 2D MOD biplane is 60.1 %. The left ventricle has normal function. The left ventricle has no regional wall motion abnormalities. The left ventricular internal cavity size was normal in size. There is no left ventricular hypertrophy. Left ventricular diastolic parameters were normal. Normal left ventricular filling pressure. Right Ventricle: The right ventricular size is normal. No increase in right ventricular wall thickness. Right ventricular systolic function is normal. Tricuspid regurgitation signal is inadequate for assessing PA pressure. Left Atrium: Left atrial size was normal in size. Right Atrium: Right atrial size was normal in size. Pericardium: Trivial pericardial effusion is present. Mitral Valve: The mitral valve is grossly normal. No evidence of mitral valve regurgitation. No evidence of mitral valve stenosis. Tricuspid Valve: The tricuspid valve is grossly normal. Tricuspid valve regurgitation is not demonstrated. No evidence of tricuspid stenosis. Aortic Valve: The aortic valve is tricuspid. Aortic valve regurgitation is not visualized. No aortic stenosis is present. Pulmonic Valve: The pulmonic valve was grossly normal. Pulmonic valve regurgitation is not visualized. No evidence of pulmonic stenosis. Aorta: The aortic root and ascending aorta are structurally normal, with no evidence of dilitation. Venous: The right upper pulmonary vein is normal. The inferior vena cava is normal in size with greater than 50% respiratory variability, suggesting right atrial pressure of 3 mmHg. IAS/Shunts: The atrial septum is grossly normal.  LEFT VENTRICLE PLAX 2D                         Biplane EF (MOD) LVIDd:         4.40 cm         LV Biplane EF:   Left LVIDs:         2.30 cm                          ventricular LV PW:         1.10 cm                          ejection LV IVS:        1.04 cm                          fraction by LVOT diam:     2.10 cm                          2D MOD LV SV:         74                               biplane is LV SV Index:   37                               60.1 %. LVOT Area:     3.46 cm                                Diastology  LV e' medial:    9.79 cm/s LV Volumes (MOD)               LV E/e' medial:  9.1 LV vol d, MOD    78.8 ml       LV e' lateral:   9.90 cm/s A2C:                           LV E/e' lateral: 9.0 LV vol d, MOD    78.3 ml A4C: LV vol s, MOD    28.5 ml A2C: LV vol s, MOD    34.2 ml A4C: LV SV MOD A2C:   50.3 ml LV SV MOD A4C:   78.3 ml LV SV MOD BP:    47.1 ml IVC IVC diam: 1.40 cm LEFT ATRIUM             Index       RIGHT ATRIUM          Index LA diam:        2.80 cm 1.40 cm/m  RA Area:     9.08 cm LA Vol (A2C):   17.9 ml 8.93 ml/m  RA Volume:   13.80 ml 6.89 ml/m LA Vol (A4C):   20.9 ml 10.43 ml/m LA Biplane Vol: 20.2 ml 10.08 ml/m  AORTIC VALVE LVOT Vmax:   106.00 cm/s LVOT Vmean:  79.400 cm/s LVOT VTI:    0.214 m  AORTA Ao Root diam: 3.00 cm Ao Asc diam:  2.80 cm MITRAL VALVE MV Area (PHT): 4.31 cm    SHUNTS MV Decel Time: 176 msec    Systemic VTI:  0.21 m MV E velocity: 89.20 cm/s  Systemic Diam: 2.10 cm MV A velocity: 53.80 cm/s MV E/A ratio:  1.66 Lennie Odor MD Electronically signed by Lennie Odor MD Signature Date/Time: 04/09/2020/11:23:38 AM    Final    VAS [place] LOWER EXTREMITY VENOUS (DVT)  Result Date: 04/03/2020  Lower Venous DVT Study Indications: Elevated ddimer.  Limitations: Body habitus and poor ultrasound/tissue interface. Comparison Study: no prior Performing Technologist: Blanch Media RVS  Examination Guidelines: A complete evaluation includes B-mode imaging, spectral Doppler,  color Doppler, and power Doppler as needed of all accessible portions of each vessel. Bilateral testing is considered an integral part of a complete examination. Limited examinations for reoccurring indications may be performed as noted. The reflux portion of the exam is performed with the patient in reverse Trendelenburg.  +---------+---------------+---------+-----------+----------+--------------+ RIGHT    CompressibilityPhasicitySpontaneityPropertiesThrombus Aging +---------+---------------+---------+-----------+----------+--------------+ CFV      Full           Yes      Yes                                 +---------+---------------+---------+-----------+----------+--------------+ SFJ      Full                                                        +---------+---------------+---------+-----------+----------+--------------+ FV Prox  Full                                                        +---------+---------------+---------+-----------+----------+--------------+  FV Mid   Full                                                        +---------+---------------+---------+-----------+----------+--------------+ FV Distal               Yes      Yes                                 +---------+---------------+---------+-----------+----------+--------------+ PFV      Full                                                        +---------+---------------+---------+-----------+----------+--------------+ POP      Full           Yes      Yes                                 +---------+---------------+---------+-----------+----------+--------------+ PTV      Full                                                        +---------+---------------+---------+-----------+----------+--------------+ PERO     Full                                                        +---------+---------------+---------+-----------+----------+--------------+    +---------+---------------+---------+-----------+----------+--------------+ LEFT     CompressibilityPhasicitySpontaneityPropertiesThrombus Aging +---------+---------------+---------+-----------+----------+--------------+ CFV      Full           Yes      Yes                                 +---------+---------------+---------+-----------+----------+--------------+ SFJ      Full                                                        +---------+---------------+---------+-----------+----------+--------------+ FV Prox  Full                                                        +---------+---------------+---------+-----------+----------+--------------+ FV Mid                  Yes      Yes                                 +---------+---------------+---------+-----------+----------+--------------+  FV Distal               Yes      Yes                                 +---------+---------------+---------+-----------+----------+--------------+ PFV      Full                                                        +---------+---------------+---------+-----------+----------+--------------+ POP      Full           Yes      Yes                                 +---------+---------------+---------+-----------+----------+--------------+ PTV      Full                                                        +---------+---------------+---------+-----------+----------+--------------+ PERO     Full                                                        +---------+---------------+---------+-----------+----------+--------------+     Summary: BILATERAL: - No evidence of deep vein thrombosis seen in the lower extremities, bilaterally. - No evidence of superficial venous thrombosis in the lower extremities, bilaterally. -No evidence of popliteal cyst, bilaterally.   *See table(s) above for measurements and observations. Electronically signed by Waverly Ferrari MD on  04/03/2020 at 7:58:25 PM.    Final      Assessment & Plan:   History of COVID-19 Covid 19 pneumonia Acute respiratory failure Bilateral PE:   Stay well hydrated  Stay active  Deep breathing exercises  May take tylenol for fever or pain  May take mucinex twice daily  Will order chest x ray  Will order repeat labs  Continue Eliquis  May wean O2 to keep O2 sats above 92%  Please continue PT   Tachycardia:  Will place referral to cardiology   Prediabetes:  Metformin causing nausea - will get appointment set up to establish care with new PCP  Will order zofran   Follow up:  Follow up in 2 weeks or sooner if needed       Ivonne Andrew, NP 04/23/2020

## 2020-04-23 NOTE — Patient Instructions (Addendum)
Covid 19 pneumonia Acute respiratory failure Bilateral PE:   Stay well hydrated  Stay active  Deep breathing exercises  May take tylenol for fever or pain  May take mucinex twice daily  Will order chest x ray  Will order repeat labs  Continue Eliquis  May wean O2 to keep O2 sats above 92%  Please continue PT   Tachycardia:  Will place referral to cardiology   Prediabetes:  Metformin causing nausea - will get appointment set up to establish care with new PCP  Will order zofran   Follow up:  Follow up in 2 weeks or sooner if needed

## 2020-04-23 NOTE — Assessment & Plan Note (Signed)
Covid 19 pneumonia °Acute respiratory failure °Bilateral PE: ° ° °Stay well hydrated ° °Stay active ° °Deep breathing exercises ° °May take tylenol for fever or pain ° °May take mucinex twice daily ° °Will order chest x ray ° °Will order repeat labs ° °Continue Eliquis ° °May wean O2 to keep O2 sats above 92% ° °Please continue PT ° ° °Tachycardia: ° °Will place referral to cardiology ° ° °Prediabetes: ° °Metformin causing nausea - will get appointment set up to establish care with new PCP ° °Will order zofran ° ° °Follow up: ° °Follow up in 2 weeks or sooner if needed ° ° °

## 2020-04-30 ENCOUNTER — Ambulatory Visit
Admission: RE | Admit: 2020-04-30 | Discharge: 2020-04-30 | Disposition: A | Discharge status: Home / Self Care | Payer: 59 | Source: Ambulatory Visit | Attending: Nurse Practitioner | Admitting: Nurse Practitioner

## 2020-04-30 ENCOUNTER — Other Ambulatory Visit: Payer: Self-pay

## 2020-05-01 ENCOUNTER — Other Ambulatory Visit: Payer: Self-pay | Admitting: Nurse Practitioner

## 2020-05-01 LAB — COMPREHENSIVE METABOLIC PANEL
ALT: 22 IU/L (ref 0–32)
AST: 15 IU/L (ref 0–40)
Albumin/Globulin Ratio: 1.6 (ref 1.2–2.2)
Albumin: 4.2 g/dL (ref 3.9–5.0)
Alkaline Phosphatase: 61 IU/L (ref 44–121)
BUN/Creatinine Ratio: 12 (ref 9–23)
BUN: 8 mg/dL (ref 6–20)
Bilirubin Total: <0.2 mg/dL (ref 0.0–1.2)
CO2: 21 mmol/L (ref 20–29)
Calcium: 9.6 mg/dL (ref 8.7–10.2)
Chloride: 104 mmol/L (ref 96–106)
Creatinine, Ser: 0.67 mg/dL (ref 0.57–1.00)
GFR calc Af Amer: 136 mL/min/{1.73_m2} (ref >59)
GFR calc non Af Amer: 118 mL/min/{1.73_m2} (ref >59)
Globulin, Total: 2.7 g/dL (ref 1.5–4.5)
Glucose: 91 mg/dL (ref 65–99)
Potassium: 4.1 mmol/L (ref 3.5–5.2)
Sodium: 140 mmol/L (ref 134–144)
Total Protein: 6.9 g/dL (ref 6.0–8.5)

## 2020-05-01 LAB — CBC
Hematocrit: 33.3 % — ABNORMAL LOW (ref 34.0–46.6)
Hemoglobin: 10.7 g/dL — ABNORMAL LOW (ref 11.1–15.9)
MCH: 26 pg — ABNORMAL LOW (ref 26.6–33.0)
MCHC: 32.1 g/dL (ref 31.5–35.7)
MCV: 81 fL (ref 79–97)
Platelets: 332 10*3/uL (ref 150–450)
RBC: 4.11 x10E6/uL (ref 3.77–5.28)
RDW: 14.5 % (ref 11.7–15.4)
WBC: 6.7 10*3/uL (ref 3.4–10.8)

## 2020-05-01 MED ORDER — PREDNISONE 20 MG PO TABS: 20.0000 mg | ORAL_TABLET | Freq: Every day | ORAL | 0 refills | Status: AC

## 2020-05-01 NOTE — Progress Notes (Signed)
Patient called stating that right lower lung pain has returned. Will order prednisone.

## 2020-05-08 ENCOUNTER — Ambulatory Visit (INDEPENDENT_AMBULATORY_CARE_PROVIDER_SITE_OTHER): Payer: 59 | Admitting: Nurse Practitioner

## 2020-05-08 VITALS — BP 108/72 | HR 104 | Temp 97.5°F | Ht 61.0 in | Wt 236.0 lb

## 2020-05-08 DIAGNOSIS — Z8616 Personal history of COVID-19: Secondary | ICD-10-CM | POA: Diagnosis not present

## 2020-05-08 DIAGNOSIS — U071 COVID-19: Secondary | ICD-10-CM

## 2020-05-08 DIAGNOSIS — I2694 Multiple subsegmental pulmonary emboli without acute cor pulmonale: Secondary | ICD-10-CM

## 2020-05-08 DIAGNOSIS — J1282 Pneumonia due to coronavirus disease 2019: Secondary | ICD-10-CM | POA: Diagnosis not present

## 2020-05-08 MED ORDER — APIXABAN 5 MG PO TABS: 5.0000 mg | ORAL_TABLET | Freq: Two times a day (BID) | ORAL | 2 refills | Status: DC

## 2020-05-08 MED ORDER — PREDNISONE 10 MG PO TABS: ORAL_TABLET | ORAL | 0 refills | Status: DC

## 2020-05-08 MED ORDER — TIZANIDINE HCL 4 MG PO TABS: 4.0000 mg | ORAL_TABLET | Freq: Four times a day (QID) | ORAL | 0 refills | Status: DC | PRN

## 2020-05-08 NOTE — Progress Notes (Signed)
@Patient  ID: , female    DOB: 1990/07/28, 30 y.o.   MRN: [id number]  Chief Complaint  Patient presents with  . Follow-up    Still having right side pain, pain with deep breaths. Still on 2L. Tachycardia has appt with Cardiology 1/25. Needing refill on eliquis.     Referring provider: No ref. provider found   30 year old female with history of hypertension and obesity.  HPI  Patient presents today for post COVID care clinic visit/follow-up.  Patient was last seen here on 04/23/2020.  Chest x-ray did show improvement but still shows pneumonia to right lower lung.  Patient states that she has been having pain on the right lower side.  She is checking her O2 sats frequently.  She is on room air during the day unless she exerts herself and then she uses 1 L of O2.  She was previously on 2 L.  She states that over the past 3 days she has been feeling worse.  She has not needed to increase her oxygen but states that she has felt short of breath and has had a couple episodes where she felt like she was going to pass out.  We discussed that we will refer her to pulmonary.  She does already have an upcoming appointment scheduled with cardiology.  She has finished home health physical therapy.  She will need to continue to work on physical therapy exercises on her own at home.  We also discussed the importance of deep breathing exercises. Denies f/c/s, n/v/d, hemoptysis, PND, chest pain or edema.       No Known Allergies  There is no immunization history for the selected administration types on file for this patient.  Past Medical History:  Diagnosis Date  . Chlamydia   . Hypertension   . Infection    UTI  . Morbid obesity (HCC)   . Urinary tract infection   . Vaginal Pap smear, abnormal    f/u was ok    Tobacco History: Social History   Tobacco Use  Smoking Status Former Smoker  . Packs/day: 0.25  . Quit date: 04/05/2015  . Years since quitting: 5.0   Smokeless Tobacco Never Used   Counseling given: Not Answered   Outpatient Encounter Medications as of 05/08/2020  Medication Sig  . acetaminophen (TYLENOL) 500 MG tablet Take 500 mg by mouth every 6 (six) hours as needed for mild pain.   07/06/2020 albuterol (VENTOLIN HFA) 108 (90 Base) MCG/ACT inhaler Inhale 2 puffs into the lungs every 6 (six) hours as needed for wheezing or shortness of breath.  Marland Kitchen apixaban (ELIQUIS) 5 MG TABS tablet Take 1 tablet (5 mg total) by mouth 2 (two) times daily.  . metFORMIN (GLUCOPHAGE) 500 MG tablet Take 1 tablet (500 mg total) by mouth 2 (two) times daily with a meal.  . ondansetron (ZOFRAN ODT) 4 MG disintegrating tablet Take 1 tablet (4 mg total) by mouth every 8 (eight) hours as needed for nausea or vomiting.  . predniSONE (DELTASONE) 10 MG tablet Take 4 tabs for 2 days, then 3 tabs for 2 days, then 2 tabs for 2 days, then 1 tab for 2 days, then stop  . tiZANidine (ZANAFLEX) 4 MG tablet Take 1 tablet (4 mg total) by mouth every 6 (six) hours as needed for muscle spasms.  . [DISCONTINUED] apixaban (ELIQUIS) 5 MG TABS tablet Take 2 tablets (10 mg) by mouth 2 times a day for 2 days then take 1 tablet (5 mg)  by 2 times a day.  . [DISCONTINUED] APIXABAN (ELIQUIS) VTE STARTER PACK (10MG  AND 5MG ) Take as directed on package   No facility-administered encounter medications on file as of 05/08/2020.     Review of Systems  Review of Systems  Constitutional: Positive for fatigue. Negative for fever.  HENT: Negative.   Respiratory: Positive for cough and shortness of breath.   Cardiovascular: Positive for palpitations. Negative for chest pain and leg swelling.  Gastrointestinal: Negative.   Allergic/Immunologic: Negative.   Neurological: Negative.   Psychiatric/Behavioral: Negative.        Physical Exam  BP 108/72 (BP Location: Left Arm)   Pulse (!) 104   Temp (!) 97.5 F (36.4 C)   Ht 5\' 1"  (1.549 m)   Wt 236 lb 0.1 oz (107.1 kg)   LMP 04/23/2020 (Exact  Date)   SpO2 97%   BMI 44.59 kg/m   Wt Readings from Last 5 Encounters:  05/08/20 236 lb 0.1 oz (107.1 kg)  04/02/20 230 lb (104.3 kg)  02/15/18 216 lb (98 kg)  05/25/17 234 lb (106.1 kg)  12/11/16 221 lb 9.6 oz (100.5 kg)     Physical Exam Vitals and nursing note reviewed.  Constitutional:      General: She is not in acute distress.    Appearance: She is well-developed and well-nourished.  Cardiovascular:     Rate and Rhythm: Normal rate and regular rhythm.  Pulmonary:     Effort: Pulmonary effort is normal.     Comments: Diminished  Musculoskeletal:     Right lower leg: No edema.     Left lower leg: No edema.  Neurological:     Mental Status: She is alert and oriented to person, place, and time.  Psychiatric:        Mood and Affect: Mood and affect and mood normal.        Behavior: Behavior normal.      Imaging: DG Chest 2 View  Result Date: 04/30/2020 CLINICAL DATA:  Cough and shortness of breath. Chest pain. COVID 19 virus infection. EXAM: CHEST - 2 VIEW COMPARISON:  04/05/2020 FINDINGS: The heart size and mediastinal contours are within normal limits. Improved aeration of both lungs is seen since prior study. Mild residual infiltrate or atelectasis is seen in the posterior right lower lobe. Left lung is clear. No evidence of pleural effusion. IMPRESSION: Significantly improved aeration of both lungs since prior study. Mild residual atelectasis or infiltrate in posterior right lower lobe. Electronically Signed   By: 02/10/17 M.D.   On: 04/30/2020 09:00   ECHOCARDIOGRAM COMPLETE  Result Date: 04/09/2020    ECHOCARDIOGRAM REPORT   Patient Name:   KRYSTIAN FERRENTINO Date of Exam: 04/09/2020 Medical Rec #:  14/10/2019             Height:       61.0 in Accession #:    [id number] Huge            Weight:       230.0 lb Date of Birth:  Nov 16, 1990             BSA:          2.004 m Patient Age:    29 years              BP:           125/70 mmHg Patient Gender: F  HR:           77 bpm. Exam Location:  Inpatient Procedure: 2D Echo, Cardiac Doppler and Color Doppler Indications:    I26.02 Pulmonary embolus  History:        Patient has no prior history of Echocardiogram examinations.                 Signs/Symptoms:Shortness of Breath, Dyspnea and Chest Pain; Risk                 Factors:Hypertension. Covid positive. Pulmonary embolus.  Sonographer:    Roseanna Rainbow RDCS Referring Phys: [id number] DAWOOD S [facility]  Sonographer Comments: Technically difficult study due to poor echo windows and patient is morbidly obese. Image acquisition challenging due to patient body habitus. IMPRESSIONS  1. Left ventricular ejection fraction, by estimation, is 60 to 65%. Left ventricular ejection fraction by 2D MOD biplane is 60.1 %. The left ventricle has normal function. The left ventricle has no regional wall motion abnormalities. Left ventricular diastolic parameters were normal.  2. Right ventricular systolic function is normal. The right ventricular size is normal. Tricuspid regurgitation signal is inadequate for assessing PA pressure.  3. The mitral valve is grossly normal. No evidence of mitral valve regurgitation. No evidence of mitral stenosis.  4. The aortic valve is tricuspid. Aortic valve regurgitation is not visualized. No aortic stenosis is present.  5. The inferior vena cava is normal in size with greater than 50% respiratory variability, suggesting right atrial pressure of 3 mmHg. Conclusion(s)/Recommendation(s): Normal biventricular function without evidence of hemodynamically significant valvular heart disease. FINDINGS  Left Ventricle: Left ventricular ejection fraction, by estimation, is 60 to 65%. Left ventricular ejection fraction by 2D MOD biplane is 60.1 %. The left ventricle has normal function. The left ventricle has no regional wall motion abnormalities. The left ventricular internal cavity size was normal in size. There is no left ventricular hypertrophy. Left  ventricular diastolic parameters were normal. Normal left ventricular filling pressure. Right Ventricle: The right ventricular size is normal. No increase in right ventricular wall thickness. Right ventricular systolic function is normal. Tricuspid regurgitation signal is inadequate for assessing PA pressure. Left Atrium: Left atrial size was normal in size. Right Atrium: Right atrial size was normal in size. Pericardium: Trivial pericardial effusion is present. Mitral Valve: The mitral valve is grossly normal. No evidence of mitral valve regurgitation. No evidence of mitral valve stenosis. Tricuspid Valve: The tricuspid valve is grossly normal. Tricuspid valve regurgitation is not demonstrated. No evidence of tricuspid stenosis. Aortic Valve: The aortic valve is tricuspid. Aortic valve regurgitation is not visualized. No aortic stenosis is present. Pulmonic Valve: The pulmonic valve was grossly normal. Pulmonic valve regurgitation is not visualized. No evidence of pulmonic stenosis. Aorta: The aortic root and ascending aorta are structurally normal, with no evidence of dilitation. Venous: The right upper pulmonary vein is normal. The inferior vena cava is normal in size with greater than 50% respiratory variability, suggesting right atrial pressure of 3 mmHg. IAS/Shunts: The atrial septum is grossly normal.  LEFT VENTRICLE PLAX 2D                        Biplane EF (MOD) LVIDd:         4.40 cm         LV Biplane EF:   Left LVIDs:         2.30 cm  ventricular LV PW:         1.10 cm                          ejection LV IVS:        1.04 cm                          fraction by LVOT diam:     2.10 cm                          2D MOD LV SV:         74                               biplane is LV SV Index:   37                               60.1 %. LVOT Area:     3.46 cm                                Diastology                                LV e' medial:    9.79 cm/s LV Volumes (MOD)                LV E/e' medial:  9.1 LV vol d, MOD    78.8 ml       LV e' lateral:   9.90 cm/s A2C:                           LV E/e' lateral: 9.0 LV vol d, MOD    78.3 ml A4C: LV vol s, MOD    28.5 ml A2C: LV vol s, MOD    34.2 ml A4C: LV SV MOD A2C:   50.3 ml LV SV MOD A4C:   78.3 ml LV SV MOD BP:    47.1 ml IVC IVC diam: 1.40 cm LEFT ATRIUM             Index       RIGHT ATRIUM          Index LA diam:        2.80 cm 1.40 cm/m  RA Area:     9.08 cm LA Vol (A2C):   17.9 ml 8.93 ml/m  RA Volume:   13.80 ml 6.89 ml/m LA Vol (A4C):   20.9 ml 10.43 ml/m LA Biplane Vol: 20.2 ml 10.08 ml/m  AORTIC VALVE LVOT Vmax:   106.00 cm/s LVOT Vmean:  79.400 cm/s LVOT VTI:    0.214 m  AORTA Ao Root diam: 3.00 cm Ao Asc diam:  2.80 cm MITRAL VALVE MV Area (PHT): 4.31 cm    SHUNTS MV Decel Time: 176 msec    Systemic VTI:  0.21 m MV E velocity: 89.20 cm/s  Systemic Diam: 2.10 cm MV A velocity: 53.80 cm/s MV E/A ratio:  1.66 Lennie Odor MD Electronically signed by Lennie Odor MD Signature Date/Time: 04/09/2020/11:23:38 AM    Final      Assessment & Plan:  History of COVID-19 Covid 19 pneumonia Acute respiratory failure Bilateral PE:   Stay well hydrated  Stay active  Deep breathing exercises  May take tylenol for fever or pain  May take mucinex twice daily  Will place referral to pulmonary  Continue Eliquis - refilled  Will order prednisone  May start zyrtec  May wean O2 to keep O2 sats above 92%  Please continue home PT exercises   Tachycardia:  Continue to follow cardiology   Prediabetes:  Metformin causing nausea - please keep upcoming appointment  to establish care with new PCP  Will order zofran   Follow up:  Follow up in 1 month or sooner if needed        Ivonne Andrew, NP 05/08/2020

## 2020-05-08 NOTE — Patient Instructions (Addendum)
History of COVID-19 Covid 19 pneumonia Acute respiratory failure Bilateral PE:   Stay well hydrated  Stay active  Deep breathing exercises  May take tylenol for fever or pain  May take mucinex twice daily  Will place referral to pulmonary  Continue Eliquis - refilled  Will order prednisone  May start zyrtec  May wean O2 to keep O2 sats above 92%  Please continue home PT exercises   Tachycardia:  Continue to follow cardiology   Prediabetes:  Metformin causing nausea - please keep upcoming appointment  to establish care with new PCP  Will order zofran   Follow up:  Follow up in 1 month or sooner if needed

## 2020-05-08 NOTE — Assessment & Plan Note (Signed)
Covid 19 pneumonia Acute respiratory failure Bilateral PE:   Stay well hydrated  Stay active  Deep breathing exercises  May take tylenol for fever or pain  May take mucinex twice daily  Will place referral to pulmonary  Continue Eliquis - refilled  Will order prednisone  May start zyrtec  May wean O2 to keep O2 sats above 92%  Please continue home PT exercises   Tachycardia:  Continue to follow cardiology   Prediabetes:  Metformin causing nausea - please keep upcoming appointment  to establish care with new PCP  Will order zofran   Follow up:  Follow up in 1 month or sooner if needed

## 2020-05-14 ENCOUNTER — Encounter: Payer: Self-pay | Admitting: Nurse Practitioner

## 2020-05-14 ENCOUNTER — Other Ambulatory Visit: Payer: Self-pay

## 2020-05-14 ENCOUNTER — Ambulatory Visit (INDEPENDENT_AMBULATORY_CARE_PROVIDER_SITE_OTHER): Payer: 59 | Admitting: Nurse Practitioner

## 2020-05-14 VITALS — BP 136/73 | HR 99 | Temp 99.1°F | Ht 61.0 in | Wt 237.0 lb

## 2020-05-14 DIAGNOSIS — Z7689 Persons encountering health services in other specified circumstances: Secondary | ICD-10-CM

## 2020-05-14 DIAGNOSIS — U071 COVID-19: Secondary | ICD-10-CM | POA: Diagnosis not present

## 2020-05-14 DIAGNOSIS — R739 Hyperglycemia, unspecified: Secondary | ICD-10-CM

## 2020-05-14 DIAGNOSIS — R000 Tachycardia, unspecified: Secondary | ICD-10-CM | POA: Diagnosis not present

## 2020-05-14 DIAGNOSIS — J1282 Pneumonia due to coronavirus disease 2019: Secondary | ICD-10-CM

## 2020-05-14 DIAGNOSIS — Z6841 Body Mass Index (BMI) 40.0 and over, adult: Secondary | ICD-10-CM

## 2020-05-14 LAB — GLUCOSE, POCT (MANUAL RESULT ENTRY): POC Glucose: 150 mg/dl — AB (ref 70–99)

## 2020-05-14 MED ORDER — BLOOD GLUCOSE METER KIT: PACK | 0 refills | Status: DC

## 2020-05-14 MED ORDER — AMOXICILLIN-POT CLAVULANATE 875-125 MG PO TABS: 1.0000 | ORAL_TABLET | Freq: Two times a day (BID) | ORAL | 0 refills | Status: AC

## 2020-05-14 NOTE — Patient Instructions (Addendum)
Diabetes Mellitus and Nutrition, Adult When you have diabetes, or diabetes mellitus, it is very important to have healthy eating habits because your blood sugar (glucose) levels are greatly affected by what you eat and drink. Eating healthy foods in the right amounts, at about the same times every day, can help you:  Control your blood glucose.  Lower your risk of heart disease.  Improve your blood pressure.  Reach or maintain a healthy weight. What can affect my meal plan? Every person with diabetes is different, and each person has different needs for a meal plan. Your health care provider may recommend that you work with a dietitian to make a meal plan that is best for you. Your meal plan may vary depending on factors such as:  The calories you need.  The medicines you take.  Your weight.  Your blood glucose, blood pressure, and cholesterol levels.  Your activity level.  Other health conditions you have, such as heart or kidney disease. How do carbohydrates affect me? Carbohydrates, also called carbs, affect your blood glucose level more than any other type of food. Eating carbs naturally raises the amount of glucose in your blood. Carb counting is a method for keeping track of how many carbs you eat. Counting carbs is important to keep your blood glucose at a healthy level, especially if you use insulin or take certain oral diabetes medicines. It is important to know how many carbs you can safely have in each meal. This is different for every person. Your dietitian can help you calculate how many carbs you should have at each meal and for each snack. How does alcohol affect me? Alcohol can cause a sudden decrease in blood glucose (hypoglycemia), especially if you use insulin or take certain oral diabetes medicines. Hypoglycemia can be a life-threatening condition. Symptoms of hypoglycemia, such as sleepiness, dizziness, and confusion, are similar to symptoms of having too much  alcohol.  Do not drink alcohol if: ? Your health care provider tells you not to drink. ? You are pregnant, may be pregnant, or are planning to become pregnant.  If you drink alcohol: ? Do not drink on an empty stomach. ? Limit how much you use to:  0-1 drink a day for women.  0-2 drinks a day for men. ? Be aware of how much alcohol is in your drink. In the [place] one drink equals one 12 oz bottle of beer (355 mL), one 5 oz glass of wine (148 mL), or one 1 oz glass of hard liquor (44 mL). ? Keep yourself hydrated with water, diet soda, or unsweetened iced tea.  Keep in mind that regular soda, juice, and other mixers may contain a lot of sugar and must be counted as carbs. What are tips for following this plan? Reading food labels  Start by checking the serving size on the "Nutrition Facts" label of packaged foods and drinks. The amount of calories, carbs, fats, and other nutrients listed on the label is based on one serving of the item. Many items contain more than one serving per package.  Check the total grams (g) of carbs in one serving. You can calculate the number of servings of carbs in one serving by dividing the total carbs by 15. For example, if a food has 30 g of total carbs per serving, it would be equal to 2 servings of carbs.  Check the number of grams (g) of saturated fats and trans fats in one serving. Choose foods that have   a low amount or none of these fats.  Check the number of milligrams (mg) of salt (sodium) in one serving. Most people should limit total sodium intake to less than 2,300 mg per day.  Always check the nutrition information of foods labeled as "low-fat" or "nonfat." These foods may be higher in added sugar or refined carbs and should be avoided.  Talk to your dietitian to identify your daily goals for nutrients listed on the label. Shopping  Avoid buying canned, pre-made, or processed foods. These foods tend to be high in fat, sodium, and added  sugar.  Shop around the outside edge of the grocery store. This is where you will most often find fresh fruits and vegetables, bulk grains, fresh meats, and fresh dairy. Cooking  Use low-heat cooking methods, such as baking, instead of high-heat cooking methods like deep frying.  Cook using healthy oils, such as olive, canola, or sunflower oil.  Avoid cooking with butter, cream, or high-fat meats. Meal planning  Eat meals and snacks regularly, preferably at the same times every day. Avoid going long periods of time without eating.  Eat foods that are high in fiber, such as fresh fruits, vegetables, beans, and whole grains. Talk with your dietitian about how many servings of carbs you can eat at each meal.  Eat 4-6 oz (112-168 g) of lean protein each day, such as lean meat, chicken, fish, eggs, or tofu. One ounce (oz) of lean protein is equal to: ? 1 oz (28 g) of meat, chicken, or fish. ? 1 egg. ?  cup (62 g) of tofu.  Eat some foods each day that contain healthy fats, such as avocado, nuts, seeds, and fish.   What foods should I eat? Fruits Berries. Apples. Oranges. Peaches. Apricots. Plums. Grapes. Mango. Papaya. Pomegranate. Kiwi. Cherries. Vegetables Lettuce. Spinach. Leafy greens, including kale, chard, collard greens, and mustard greens. Beets. Cauliflower. Cabbage. Broccoli. Carrots. Green beans. Tomatoes. Peppers. Onions. Cucumbers. Brussels sprouts. Grains Whole grains, such as whole-wheat or whole-grain bread, crackers, tortillas, cereal, and pasta. Unsweetened oatmeal. Quinoa. Brown or wild rice. Meats and other proteins Seafood. Poultry without skin. Lean cuts of poultry and beef. Tofu. Nuts. Seeds. Dairy Low-fat or fat-free dairy products such as milk, yogurt, and cheese. The items listed above may not be a complete list of foods and beverages you can eat. Contact a dietitian for more information. What foods should I avoid? Fruits Fruits canned with  syrup. Vegetables Canned vegetables. Frozen vegetables with butter or cream sauce. Grains Refined white flour and flour products such as bread, pasta, snack foods, and cereals. Avoid all processed foods. Meats and other proteins Fatty cuts of meat. Poultry with skin. Breaded or fried meats. Processed meat. Avoid saturated fats. Dairy Full-fat yogurt, cheese, or milk. Beverages Sweetened drinks, such as soda or iced tea. The items listed above may not be a complete list of foods and beverages you should avoid. Contact a dietitian for more information. Questions to ask a health care provider  Do I need to meet with a diabetes educator?  Do I need to meet with a dietitian?  What number can I call if I have questions?  When are the best times to check my blood glucose? Where to find more information:  American Diabetes Association: diabetes.org  Academy of Nutrition and Dietetics: www.eatright.org  National Institute of Diabetes and Digestive and Kidney Diseases: www.niddk.nih.gov  Association of Diabetes Care and Education Specialists: www.diabeteseducator.org Summary  It is important to have healthy eating   habits because your blood sugar (glucose) levels are greatly affected by what you eat and drink.  A healthy meal plan will help you control your blood glucose and maintain a healthy lifestyle.  Your health care provider may recommend that you work with a dietitian to make a meal plan that is best for you.  Keep in mind that carbohydrates (carbs) and alcohol have immediate effects on your blood glucose levels. It is important to count carbs and to use alcohol carefully. This information is not intended to replace advice given to you by your health care provider. Make sure you discuss any questions you have with your health care provider. Document Revised: 03/29/2019 Document Reviewed: 03/29/2019 Elsevier Patient Education  2021 Elsevier Inc.   Most Common "Long Hauler"  Symptoms  The list of "long hauler" symptoms is long and inconsistent. For some people, the lasting COVID symptoms are nothing like the original symptoms when they were first infected with COVID. The most common "long hauler' symptoms include:  Coughing Ongoing, sometimes debilitating, fatigue Body aches Joint pain Shortness of breath Loss of taste and smell - even if this didn't occur during the height of illness Difficulty sleeping Headaches Brain fog Brain fog is among the most confusing symptoms for long haulers. Patients report being unusually forgetful, confused or unable to concentrate even enough to watch TV. This can happen to people who were in an intensive care unit for a while, but it's relatively rare. However, it is happening to a variety of patients, including those who weren't hospitalized.  Some people have reported feeling better for days or even weeks then relapsing. For others, it's a case of just not feeling like themselves.  Patients who are 21 days post-COVID diagnosis with the above symptoms should be considered "long haulers". [facility] Earlene Plater, 2021)  Scheduling Patients at the [facility] in [address] Dr. Ginette Otto.    Shortness of Breath, Adult Shortness of breath means you have trouble breathing. Shortness of breath could be a sign of a medical problem. Follow these instructions at home:  Watch for any changes in your symptoms.  Do not use any products that contain nicotine or tobacco, such as cigarettes, e-cigarettes, and chewing tobacco.  Do not smoke. Smoking can cause shortness of breath. If you need help to quit smoking, ask your doctor.  Avoid things that can make it harder to breathe, such as: ? Mold. ? Dust. ? Air pollution. ? Chemical smells. ? Things that can cause allergy symptoms (allergens), if you have allergies.  Keep your living space clean. Use products that help remove mold and dust.  Rest as needed. Slowly return to your  normal activities.  Take over-the-counter and prescription medicines only as told by your doctor. This includes oxygen therapy and inhaled medicines.  Keep all follow-up visits as told by your doctor. This is important.   Contact a doctor if:  Your condition does not get better as soon as expected.  You have a hard time doing your normal activities, even after you rest.  You have new symptoms. Get help right away if:  Your shortness of breath gets worse.  You have trouble breathing when you are resting.  You feel light-headed or you pass out (faint).  You have a cough that is not helped by medicines.  You cough up blood.  You have pain with breathing.  You have pain in your chest, arms, shoulders, or belly (abdomen).  You have a fever.  You cannot walk  up stairs.  You cannot exercise the way you normally do. These symptoms may represent a serious problem that is an emergency. Do not wait to see if the symptoms will go away. Get medical help right away. Call your local emergency services (911 in the [place] Do not drive yourself to the hospital. Summary  Shortness of breath is when you have trouble breathing enough air. It can be a sign of a medical problem.  Avoid things that make it hard for you to breathe, such as smoking, pollution, mold, and dust.  Watch for any changes in your symptoms. Contact your doctor if you do not get better or you get worse. This information is not intended to replace advice given to you by your health care provider. Make sure you discuss any questions you have with your health care provider. Document Revised: 09/21/2017 Document Reviewed: 09/21/2017 Elsevier Patient Education  2021 [employer].

## 2020-05-14 NOTE — Progress Notes (Signed)
[address] [place], [place]  [address] Phone:  [phone]   Fax:  [phone].   New Patient Office Visit  Subjective:  Patient ID: Faith [name], female    DOB: Aug 16, 1990  Age: 30 y.o. MRN: [id number]  CC:  Chief Complaint  Patient presents with  . New Patient (Initial Visit)    New patient  had covid on 03/24/21, hx of dvt , sob , high heart rate, follow up w/ cardiology     HPI Faith [name] presents to establish care. She  has a past medical history of Chlamydia, Hypertension, Infection, Morbid obesity ([place]), Urinary tract infection, and Vaginal Pap smear, abnormal.  She is SP COVID-19 with pneumonia and PE . She continues have pneumonia in her right lower lobe. She is having right sided back pain. She was restarted on prednisone and provided with at muscle relaxer however this has not helping with the pain. She continues to have shortness of breath with walking and chest pain.  She is having 5/10 chest pain. She admits that the pain is due to increased walking today. She has just finished PT. She is somewhat dependent on the oxygen because she admits that her sat's are 95-100%. She would like to one day get back to work.   Past Medical History:  Diagnosis Date  . Chlamydia   . Hypertension   . Infection    UTI  . Morbid obesity ([place])   . Urinary tract infection   . Vaginal Pap smear, abnormal    f/u was ok    Past Surgical History:  Procedure Laterality Date  . CESAREAN SECTION    . COLPOSCOPY    . DILATION AND EVACUATION N/A 10/04/2012   Procedure: DILATATION AND EVACUATION;  Surgeon: Guss Bunde, MD;  Location: [facility];  Service: Gynecology;  Laterality: N/A;  . LEEP  11/2016    Family History  Problem Relation Age of Onset  . Diabetes Sister   . Hypertension Paternal Grandmother   . Cancer Paternal Grandfather   . Anesthesia problems Neg Hx   . Hearing loss Neg Hx     Social History   Socioeconomic  History  . Marital status: Single    Spouse name: Not on file  . Number of children: Not on file  . Years of education: Not on file  . Highest education level: Not on file  Occupational History  . Not on file  Tobacco Use  . Smoking status: Former Smoker    Packs/day: 0.25    Quit date: 04/05/2015    Years since quitting: 5.1  . Smokeless tobacco: Never Used  Vaping Use  . Vaping Use: Never used  Substance and Sexual Activity  . Alcohol use: Not Currently  . Drug use: Never  . Sexual activity: Yes    Partners: Male    Birth control/protection: None  Other Topics Concern  . Not on file  Social History Narrative  . Not on file   Social Determinants of Health   Financial Resource Strain: Not on file  Food Insecurity: Not on file  Transportation Needs: Not on file  Physical Activity: Not on file  Stress: Not on file  Social Connections: Not on file  Intimate Partner Violence: Not on file    ROS Review of Systems  Constitutional: Negative for chills and fever.  Respiratory: Positive for shortness of breath.   Cardiovascular: Positive for chest pain and palpitations. Negative for leg  swelling.    Objective:   Today's Vitals: BP 136/73 (BP Location: Left Arm, Patient Position: Sitting, Cuff Size: Large)   Pulse 99   Temp 99.1 F (37.3 C) (Temporal)   Ht $R'5\' 1"'zV$  (1.549 m)   Wt 237 lb (107.5 kg)   LMP 04/23/2020 (Exact Date)   SpO2 99% Comment: 2 litler  BMI 44.78 kg/m   Physical Exam Constitutional:      General: She is not in acute distress.    Appearance: She is obese. She is not ill-appearing, toxic-appearing or diaphoretic.  HENT:     Head: Normocephalic and atraumatic.     Nose: Nose normal.     Mouth/Throat:     Mouth: Mucous membranes are moist.  Cardiovascular:     Rate and Rhythm: Normal rate and regular rhythm.     Pulses: Normal pulses.     Heart sounds: Normal heart sounds.  Pulmonary:     Effort: Pulmonary effort is normal.     Comments:  Oxygen therapy in use Coarse bs in bases on initial ascultation however it seemed to have cleared Abdominal:     General: Bowel sounds are normal.     Palpations: Abdomen is soft.  Musculoskeletal:     Cervical back: Normal range of motion.  Skin:    General: Skin is warm and dry.     Capillary Refill: Capillary refill takes less than 2 seconds.  Neurological:     General: No focal deficit present.     Mental Status: She is alert and oriented to person, place, and time.  Psychiatric:        Mood and Affect: Mood normal.     Assessment & Plan:   Problem List Items Addressed This Visit      Respiratory   Pneumonia due to COVID-19 virus Trial Augmentin for persistent pneumonia along with symptoms Pulmonology referral in place   Relevant Medications   amoxicillin-clavulanate (AUGMENTIN) 875-125 MG tablet     Other   Tachycardia   Relevant Orders   TSH Stable encouraged patient to follow-up with cardiology as scheduled    Other Visit Diagnoses    Encounter to establish care    -  Primary   Class 3 severe obesity due to excess calories with body mass index (BMI) of 40.0 to 44.9 in adult, unspecified whether serious comorbidity present ([place])     Persistent briefly discussed diet   Blood glucose elevated     Persistence however stable patient continues on prednisone therapy along with Metformin.  Glucose monitor ordered for patient to evaluate fasting CBG and random   Relevant Orders   Comp. Metabolic Panel (12)   POCT glucose (manual entry) (Completed)      Outpatient Encounter Medications as of 05/14/2020  Medication Sig  . acetaminophen (TYLENOL) 500 MG tablet Take 500 mg by mouth every 6 (six) hours as needed for mild pain.   Marland Kitchen albuterol (VENTOLIN HFA) 108 (90 Base) MCG/ACT inhaler Inhale 2 puffs into the lungs every 6 (six) hours as needed for wheezing or shortness of breath.  Marland Kitchen amoxicillin-clavulanate (AUGMENTIN) 875-125 MG tablet Take 1 tablet by mouth 2 (two) times  daily for 10 days.  Marland Kitchen apixaban (ELIQUIS) 5 MG TABS tablet Take 1 tablet (5 mg total) by mouth 2 (two) times daily.  . blood glucose meter kit and supplies Dispense based on patient and insurance preference. Use up to four times daily as directed. (FOR ICD-10 E10.9, E11.9).  . metFORMIN (GLUCOPHAGE) 500 MG  tablet Take 1 tablet (500 mg total) by mouth 2 (two) times daily with a meal.  . ondansetron (ZOFRAN ODT) 4 MG disintegrating tablet Take 1 tablet (4 mg total) by mouth every 8 (eight) hours as needed for nausea or vomiting.  . predniSONE (DELTASONE) 10 MG tablet Take 4 tabs for 2 days, then 3 tabs for 2 days, then 2 tabs for 2 days, then 1 tab for 2 days, then stop  . tiZANidine (ZANAFLEX) 4 MG tablet Take 1 tablet (4 mg total) by mouth every 6 (six) hours as needed for muscle spasms.   No facility-administered encounter medications on file as of 05/14/2020.    Follow-up: Return in about 2 months (around 07/12/2020) for post pnx and dm [id number].   Vevelyn Francois, NP

## 2020-05-15 LAB — COMP. METABOLIC PANEL (12)
AST: 11 IU/L (ref 0–40)
Albumin/Globulin Ratio: 1.5 (ref 1.2–2.2)
Albumin: 4.4 g/dL (ref 3.9–5.0)
Alkaline Phosphatase: 68 IU/L (ref 44–121)
BUN/Creatinine Ratio: 14 (ref 9–23)
BUN: 9 mg/dL (ref 6–20)
Bilirubin Total: <0.2 mg/dL (ref 0.0–1.2)
Calcium: 9.8 mg/dL (ref 8.7–10.2)
Chloride: 103 mmol/L (ref 96–106)
Creatinine, Ser: 0.64 mg/dL (ref 0.57–1.00)
GFR calc Af Amer: 138 mL/min/{1.73_m2} (ref >59)
GFR calc non Af Amer: 120 mL/min/{1.73_m2} (ref >59)
Globulin, Total: 2.9 g/dL (ref 1.5–4.5)
Glucose: 125 mg/dL — ABNORMAL HIGH (ref 65–99)
Potassium: 4.3 mmol/L (ref 3.5–5.2)
Sodium: 139 mmol/L (ref 134–144)
Total Protein: 7.3 g/dL (ref 6.0–8.5)

## 2020-05-15 LAB — TSH: TSH: 0.605 u[IU]/mL (ref 0.450–4.500)

## 2020-05-16 ENCOUNTER — Ambulatory Visit (INDEPENDENT_AMBULATORY_CARE_PROVIDER_SITE_OTHER): Payer: 59 | Admitting: Pulmonary Disease

## 2020-05-16 ENCOUNTER — Other Ambulatory Visit: Payer: Self-pay

## 2020-05-16 ENCOUNTER — Encounter: Payer: Self-pay | Admitting: Pulmonary Disease

## 2020-05-16 VITALS — BP 124/82 | HR 86 | Temp 98.4°F | Ht 61.0 in | Wt 237.0 lb

## 2020-05-16 DIAGNOSIS — I2699 Other pulmonary embolism without acute cor pulmonale: Secondary | ICD-10-CM | POA: Diagnosis not present

## 2020-05-16 DIAGNOSIS — U071 COVID-19: Secondary | ICD-10-CM

## 2020-05-16 DIAGNOSIS — J1282 Pneumonia due to coronavirus disease 2019: Secondary | ICD-10-CM | POA: Diagnosis not present

## 2020-05-16 DIAGNOSIS — R071 Chest pain on breathing: Secondary | ICD-10-CM | POA: Diagnosis not present

## 2020-05-16 NOTE — Patient Instructions (Addendum)
We will check a CT chest in May 2022 to follow up any changes from your covid 19 infection.   Continue eliquis for the pulmonary emboli  Continue incentive spirometry daily

## 2020-05-16 NOTE — Progress Notes (Signed)
Synopsis: Referred in 05/2020 for Post-covid follow up  Subjective:   PATIENT ID: Faith [name] GENDER: female DOB: 05-02-1991, MRN: [id number]   HPI  Chief Complaint  Patient presents with  . Consult    Referred by Lazaro Arms NP from post covid clinic. Was diagnosed on Nov 20th with COVID. From COVID, she developed PNA and blood clots. Was discharged on 2L of O2.    Faith [name] is a 30 year old woman, referred to pulmonary clinic for on going shortness of breath due to covid 19 pneumonia and bilateral pulmonary emboli.   She was admitted to the hospital 04/03/20 to 04/11/20. She is accompanied by her mother today. She was treated with steroids, remdesivir and baricitinib. She was initially treated with heparin for the pulmonary emboli and transitioned to eliquis. She denies any bleeding since being on anticoagulation. She was discharged on supplemental oxygen per request as she was concerned for dropping oxygen saturations despite the discharge summary noting she maintained an oxygen saturation of 96% on room air while walking. She has a flutter valve and incentive spirometer at home. She has an albuterol inhaler that does not improve her dyspnea. She continues to experience cough and some chest congestion that is difficult to cough up.   She is currently finishing a steroid taper from her primary care physician. She reports no change in her dyspnea since starting the steroids. She has also been prescribed augmentin. She denies cough, sputum production, fever or chills.   She continues to experience exertional dyspnea. Her main complaint at this time is her posterior thorax discomfort. The pain is worse with sitting down and lying on her left side. Walking also aggravates her pain. Standing up relieves her pain somewhat.   Past Medical History:  Diagnosis Date  . Chlamydia   . Hypertension   . Infection    UTI  . Morbid obesity ([place])   . Urinary tract infection   .  Vaginal Pap smear, abnormal    f/u was ok     Family History  Problem Relation Age of Onset  . Diabetes Sister   . Hypertension Paternal Grandmother   . Cancer Paternal Grandfather   . Anesthesia problems Neg Hx   . Hearing loss Neg Hx      Social History   Socioeconomic History  . Marital status: Single    Spouse name: Not on file  . Number of children: Not on file  . Years of education: Not on file  . Highest education level: Not on file  Occupational History  . Not on file  Tobacco Use  . Smoking status: Former Smoker    Packs/day: 0.25    Quit date: 04/05/2015    Years since quitting: 5.1  . Smokeless tobacco: Never Used  Vaping Use  . Vaping Use: Never used  Substance and Sexual Activity  . Alcohol use: Not Currently  . Drug use: Never  . Sexual activity: Yes    Partners: Male    Birth control/protection: None  Other Topics Concern  . Not on file  Social History Narrative  . Not on file   Social Determinants of Health   Financial Resource Strain: Not on file  Food Insecurity: Not on file  Transportation Needs: Not on file  Physical Activity: Not on file  Stress: Not on file  Social Connections: Not on file  Intimate Partner Violence: Not on file     No Known Allergies   Outpatient Medications Prior to  Visit  Medication Sig Dispense Refill  . acetaminophen (TYLENOL) 500 MG tablet Take 500 mg by mouth every 6 (six) hours as needed for mild pain.     Marland Kitchen albuterol (VENTOLIN HFA) 108 (90 Base) MCG/ACT inhaler Inhale 2 puffs into the lungs every 6 (six) hours as needed for wheezing or shortness of breath. 6.7 g 0  . amoxicillin-clavulanate (AUGMENTIN) 875-125 MG tablet Take 1 tablet by mouth 2 (two) times daily for 10 days. 20 tablet 0  . apixaban (ELIQUIS) 5 MG TABS tablet Take 1 tablet (5 mg total) by mouth 2 (two) times daily. 60 tablet 2  . blood glucose meter kit and supplies Dispense based on patient and insurance preference. Use up to four times daily  as directed. (FOR ICD-10 E10.9, E11.9). 1 each 0  . metFORMIN (GLUCOPHAGE) 500 MG tablet Take 1 tablet (500 mg total) by mouth 2 (two) times daily with a meal. 60 tablet 11  . ondansetron (ZOFRAN ODT) 4 MG disintegrating tablet Take 1 tablet (4 mg total) by mouth every 8 (eight) hours as needed for nausea or vomiting. 20 tablet 0  . predniSONE (DELTASONE) 10 MG tablet Take 4 tabs for 2 days, then 3 tabs for 2 days, then 2 tabs for 2 days, then 1 tab for 2 days, then stop 20 tablet 0  . tiZANidine (ZANAFLEX) 4 MG tablet Take 1 tablet (4 mg total) by mouth every 6 (six) hours as needed for muscle spasms. 30 tablet 0   No facility-administered medications prior to visit.    Review of Systems  Constitutional: Negative for chills, diaphoresis, fever, malaise/fatigue and weight loss.  HENT: Negative for congestion, sinus pain and sore throat.   Eyes: Negative.   Respiratory: Negative for cough, hemoptysis, sputum production, shortness of breath and wheezing.   Cardiovascular: Positive for chest pain. Negative for palpitations, orthopnea, claudication, leg swelling and PND.  Gastrointestinal: Negative for abdominal pain, heartburn, nausea and vomiting.  Genitourinary: Negative for hematuria.  Skin: Negative for rash.  Neurological: Negative.   Endo/Heme/Allergies: Negative.   Psychiatric/Behavioral: Negative.    Objective:   Vitals:   05/16/20 1457  BP: 124/82  Pulse: 86  Temp: 98.4 F (36.9 C)  TempSrc: Temporal  SpO2: 100%  Weight: 237 lb (107.5 kg)  Height: $Remove'5\' 1"'HcjgAeP$  (1.549 m)     Physical Exam Constitutional:      General: She is not in acute distress.    Appearance: She is obese. She is not ill-appearing.  HENT:     Head: Normocephalic and atraumatic.  Eyes:     General: No scleral icterus.    Conjunctiva/sclera: Conjunctivae normal.  Cardiovascular:     Rate and Rhythm: Normal rate and regular rhythm.     Pulses: Normal pulses.     Heart sounds: Normal heart sounds. No  murmur heard.   Pulmonary:     Effort: Pulmonary effort is normal.     Breath sounds: Normal breath sounds. No wheezing, rhonchi or rales.  Abdominal:     General: Bowel sounds are normal.     Palpations: Abdomen is soft.  Musculoskeletal:        General: Tenderness (mild of right flank area to palpation) present.     Right lower leg: No edema.     Left lower leg: No edema.  Skin:    General: Skin is warm and dry.  Neurological:     General: No focal deficit present.     Mental Status: She is alert.  Psychiatric:        Mood and Affect: Mood normal.        Behavior: Behavior normal.        Thought Content: Thought content normal.        Judgment: Judgment normal.    CBC    Component Value Date/Time   WBC 6.7 04/30/2020 0922   WBC 17.9 (H) 04/07/2020 0343   RBC 4.11 04/30/2020 0922   RBC 4.22 04/07/2020 0343   HGB 10.7 (L) 04/30/2020 0922   HCT 33.3 (L) 04/30/2020 0922   PLT 332 04/30/2020 0922   MCV 81 04/30/2020 0922   MCH 26.0 (L) 04/30/2020 0922   MCH 25.6 (L) 04/07/2020 0343   MCHC 32.1 04/30/2020 0922   MCHC 30.9 04/07/2020 0343   RDW 14.5 04/30/2020 0922   LYMPHSABS 2.2 04/07/2020 0343   MONOABS 0.8 04/07/2020 0343   EOSABS 0.0 04/07/2020 0343   BASOSABS 0.1 04/07/2020 0343   Chest imaging: CXR 04/30/20 The heart size and mediastinal contours are within normal limits. Improved aeration of both lungs is seen since prior study. Mild residual infiltrate or atelectasis is seen in the posterior right lower lobe. Left lung is clear. No evidence of pleural effusion.  CTA Chest 04/02/20 1. Bilateral segmental pulmonary emboli, with no evidence of right heart strain. 2. Patchy bilateral perihilar ground-glass airspace disease consistent with multifocal pneumonia, compatible with history of COVID 19. 3. Trace right pleural effusion. 4. Bibasilar areas of consolidation consistent with atelectasis or developing pulmonary infarct.  PFT: No flowsheet data  found.  Echo: 04/09/20 1. Left ventricular ejection fraction, by estimation, is 60 to 65%. Left  ventricular ejection fraction by 2D MOD biplane is 60.1 %. The left  ventricle has normal function. The left ventricle has no regional wall  motion abnormalities. Left ventricular  diastolic parameters were normal.  2. Right ventricular systolic function is normal. The right ventricular  size is normal. Tricuspid regurgitation signal is inadequate for assessing  PA pressure.  3. The mitral valve is grossly normal. No evidence of mitral valve  regurgitation. No evidence of mitral stenosis.  4. The aortic valve is tricuspid. Aortic valve regurgitation is not  visualized. No aortic stenosis is present.  5. The inferior vena cava is normal in size with greater than 50%  respiratory variability, suggesting right atrial pressure of 3 mmHg.   Assessment & Plan:   Pneumonia due to COVID-19 virus  Other pulmonary embolism without acute cor pulmonale, unspecified chronicity (HCC)  Chest pain on breathing - Plan: CT Chest Wo Contrast  Discussion: Faith [name] is a 30 year old woman, never smoker with recent hospitalization for covid 19 pneumonia and pulmonary emboli who is referred to pulmonary clinic for follow up evaluation.   She remains on supplemental oxygen from hospitalization on 1-2L mainly for exertion. Per discharge summary she did not need supplemental oxygen but it was requested by patient and her family. Would recommend further ambulatory oxygen monitoring to determine true necessity of supplemental oxygen.    She has right sided flank pain which is either musculoskeletal in nature or related to possible pulmonary infarct from her recent pulmonary emboli. If she continues to have severe pain she should be referred to a pain clinic or a trial of gabapentin may be helpful.    We will check a follow up CT chest in May, 6 months after her initial infection to monitor for any  permanent parenchymal changes.   She is to continue eliquis for  her pulmonary emboli for at least 6 months. She is to continue incentive spirometry daily. She is to continue to stay as active as possible to work on any deconditioning that is also adding to her dyspnea.  Follow up in 5 months.   Freda Jackson, MD [facility] Office: [phone]   Current Outpatient Medications:  .  acetaminophen (TYLENOL) 500 MG tablet, Take 500 mg by mouth every 6 (six) hours as needed for mild pain. , Disp: , Rfl:  .  albuterol (VENTOLIN HFA) 108 (90 Base) MCG/ACT inhaler, Inhale 2 puffs into the lungs every 6 (six) hours as needed for wheezing or shortness of breath., Disp: 6.7 g, Rfl: 0 .  amoxicillin-clavulanate (AUGMENTIN) 875-125 MG tablet, Take 1 tablet by mouth 2 (two) times daily for 10 days., Disp: 20 tablet, Rfl: 0 .  apixaban (ELIQUIS) 5 MG TABS tablet, Take 1 tablet (5 mg total) by mouth 2 (two) times daily., Disp: 60 tablet, Rfl: 2 .  blood glucose meter kit and supplies, Dispense based on patient and insurance preference. Use up to four times daily as directed. (FOR ICD-10 E10.9, E11.9)., Disp: 1 each, Rfl: 0 .  metFORMIN (GLUCOPHAGE) 500 MG tablet, Take 1 tablet (500 mg total) by mouth 2 (two) times daily with a meal., Disp: 60 tablet, Rfl: 11 .  ondansetron (ZOFRAN ODT) 4 MG disintegrating tablet, Take 1 tablet (4 mg total) by mouth every 8 (eight) hours as needed for nausea or vomiting., Disp: 20 tablet, Rfl: 0 .  predniSONE (DELTASONE) 10 MG tablet, Take 4 tabs for 2 days, then 3 tabs for 2 days, then 2 tabs for 2 days, then 1 tab for 2 days, then stop, Disp: 20 tablet, Rfl: 0 .  tiZANidine (ZANAFLEX) 4 MG tablet, Take 1 tablet (4 mg total) by mouth every 6 (six) hours as needed for muscle spasms., Disp: 30 tablet, Rfl: 0

## 2020-05-19 NOTE — Progress Notes (Signed)
Cardiology Office Note:    Date:  05/29/2020   ID:  Faith [name], DOB 04-02-1991, MRN [id number]  PCP:  Vevelyn Francois, NP  [facility] Cardiologist:  No primary care provider on file.  [facility] Electrophysiologist:  None   Referring MD: Fenton Foy, NP    History of Present Illness:    Faith [name] is a 30 y.o. female with a hx of covid 19 pneumonia and pulmonary emboli who was referred by Lazaro Arms, NP for further evaluation of pulmonary emboli and tachycardia.  The patient was admitted to the hospital from 04/03/20-04/11/20 for [place]. She was treated with steroids, remdesivir and baricitinib. She was found to have bilateral segmental pulmonary emboli without evidence of right heart strain. She was initially treated with heparin later transitioned to eliquis. The patient was discharged on supplemental oxygen per request as she was concerned for dropping oxygen saturations despite the discharge summary noting she maintained an oxygen saturation of 96% on room air while walking.  Today, the patient states that she feels overall fatigued and has intermittent chest pressure and sharp chest pain that goes across her chest since she left the hospital. It can happen at any time during the day with exertion or without. Sometimes coughing makes it better. Nothing makes it worse. Each episode lasts 66min-1hr. When the episode is longer, she goes and lays down. She also notably has reflux but does not take anything for it.  TTE in the hospital with normal LVEF 60-65%, normal RV size and function, no significant valvular abnormalities. CTA without any coronary calcification.   Denies any blood in the stool or urine. Tolerating apixaban well. Planned for 32month course of eliquis. Continue to have shortness of breath since COVID. Continues to intermittently use 2L oxygen. Has intermittent nausea.   Past Medical History:  Diagnosis Date  . Chlamydia   . Hypertension    . Infection    UTI  . Morbid obesity ([place])   . Urinary tract infection   . Vaginal Pap smear, abnormal    f/u was ok    Past Surgical History:  Procedure Laterality Date  . CESAREAN SECTION    . COLPOSCOPY    . DILATION AND EVACUATION N/A 10/04/2012   Procedure: DILATATION AND EVACUATION;  Surgeon: Guss Bunde, MD;  Location: [facility];  Service: Gynecology;  Laterality: N/A;  . LEEP  11/2016    Current Medications: Current Meds  Medication Sig  . acetaminophen (TYLENOL) 500 MG tablet Take 500 mg by mouth every 6 (six) hours as needed for moderate pain or headache.  . albuterol (VENTOLIN HFA) 108 (90 Base) MCG/ACT inhaler Inhale 2 puffs into the lungs every 6 (six) hours as needed for wheezing or shortness of breath.  Marland Kitchen amoxicillin-clavulanate (AUGMENTIN) 875-125 MG tablet Take 1 tablet by mouth 2 (two) times daily.  Marland Kitchen apixaban (ELIQUIS) 5 MG TABS tablet Take 1 tablet (5 mg total) by mouth 2 (two) times daily.  . blood glucose meter kit and supplies Dispense based on patient and insurance preference. Use up to four times daily as directed. (FOR ICD-10 E10.9, E11.9).  Marland Kitchen esomeprazole (NEXIUM) 40 MG capsule Take 1 capsule (40 mg total) by mouth daily at 12 noon.  . metFORMIN (GLUCOPHAGE) 500 MG tablet Take 1 tablet (500 mg total) by mouth 2 (two) times daily with a meal.  . ondansetron (ZOFRAN ODT) 4 MG disintegrating tablet Take 1 tablet (4 mg total) by mouth every 8 (eight) hours as  needed for nausea or vomiting.  Marland Kitchen tiZANidine (ZANAFLEX) 4 MG tablet Take 1 tablet (4 mg total) by mouth every 6 (six) hours as needed for muscle spasms.  . [DISCONTINUED] acetaminophen (TYLENOL) 500 MG tablet Take 500 mg by mouth every 6 (six) hours as needed for mild pain.      Allergies:   Patient has no known allergies.   Social History   Socioeconomic History  . Marital status: Single    Spouse name: Not on file  . Number of children: Not on file  . Years of education: Not on file  . Highest  education level: Not on file  Occupational History  . Not on file  Tobacco Use  . Smoking status: Former Smoker    Packs/day: 0.25    Quit date: 04/05/2015    Years since quitting: 5.1  . Smokeless tobacco: Never Used  Vaping Use  . Vaping Use: Never used  Substance and Sexual Activity  . Alcohol use: Not Currently  . Drug use: Never  . Sexual activity: Yes    Partners: Male    Birth control/protection: None  Other Topics Concern  . Not on file  Social History Narrative  . Not on file   Social Determinants of Health   Financial Resource Strain: Not on file  Food Insecurity: Not on file  Transportation Needs: Not on file  Physical Activity: Not on file  Stress: Not on file  Social Connections: Not on file     Family History: The patient's family history includes Cancer in her paternal grandfather; Diabetes in her sister; Hypertension in her paternal grandmother. There is no history of Anesthesia problems or Hearing loss.  ROS:   Please see the history of present illness.    Review of Systems  Constitutional: Positive for malaise/fatigue. Negative for chills and fever.  HENT: Negative for congestion.   Eyes: Negative for pain.  Respiratory: Positive for shortness of breath.   Cardiovascular: Positive for chest pain. Negative for palpitations, orthopnea, claudication, leg swelling and PND.  Gastrointestinal: Positive for heartburn and nausea. Negative for melena and vomiting.  Genitourinary: Negative for hematuria.  Musculoskeletal: Positive for myalgias.  Neurological: Negative for dizziness and loss of consciousness.  Endo/Heme/Allergies: Negative for polydipsia.  Psychiatric/Behavioral: Negative for substance abuse.    EKGs/Labs/Other Studies Reviewed:    The following studies were reviewed today: CTA PE protocol 04/02/20: IMPRESSION: 1. Bilateral segmental pulmonary emboli, with no evidence of right heart strain. 2. Patchy bilateral perihilar ground-glass  airspace disease consistent with multifocal pneumonia, compatible with history of COVID 19. 3. Trace right pleural effusion. 4. Bibasilar areas of consolidation consistent with atelectasis or developing pulmonary infarct.  Critical Value/emergent results were called by telephone at the time of interpretation on 04/02/2020 at 9:09 pm to provider Carmin Muskrat , who verbally acknowledged these results.  TTE 04/09/20: IMPRESSIONS  1. Left ventricular ejection fraction, by estimation, is 60 to 65%. Left  ventricular ejection fraction by 2D MOD biplane is 60.1 %. The left  ventricle has normal function. The left ventricle has no regional wall  motion abnormalities. Left ventricular  diastolic parameters were normal.  2. Right ventricular systolic function is normal. The right ventricular  size is normal. Tricuspid regurgitation signal is inadequate for assessing  PA pressure.  3. The mitral valve is grossly normal. No evidence of mitral valve  regurgitation. No evidence of mitral stenosis.  4. The aortic valve is tricuspid. Aortic valve regurgitation is not  visualized. No aortic stenosis  is present.  5. The inferior vena cava is normal in size with greater than 50%  respiratory variability, suggesting right atrial pressure of 3 mmHg.   Conclusion(s)/Recommendation(s): Normal biventricular function without  evidence of hemodynamically significant valvular heart disease.   EKG:  EKG is  ordered today.  The ekg ordered today demonstrates NSR with RSR pattern, HR 84  Recent Labs: 04/07/2020: Magnesium 2.3 04/30/2020: ALT 22; Hemoglobin 10.7; Platelets 332 05/14/2020: BUN 9; Creatinine, Ser 0.64; Potassium 4.3; Sodium 139; TSH 0.605  Recent Lipid Panel No results found for: CHOL, TRIG, HDL, CHOLHDL, VLDL, LDLCALC, LDLDIRECT     Physical Exam:    VS:  BP 124/86   Pulse 84   Ht $R'5\' 1"'au$  (1.549 m)   Wt 241 lb (109.3 kg)   SpO2 99%   BMI 45.54 kg/m     Wt Readings from Last  3 Encounters:  05/29/20 241 lb (109.3 kg)  05/16/20 237 lb (107.5 kg)  05/14/20 237 lb (107.5 kg)     GEN:  Well nourished, well developed in no acute distress HEENT: Normal NECK: No JVD; No carotid bruits CARDIAC: RRR, no murmurs, rubs, gallops RESPIRATORY:  Clear to auscultation without rales, wheezing or rhonchi  ABDOMEN: Obese, soft, non-tender, non-distended MUSCULOSKELETAL:  No edema; No deformity  SKIN: Warm and dry NEUROLOGIC:  Alert and oriented x 3 PSYCHIATRIC:  Normal affect   ASSESSMENT:    1. Other pulmonary embolism without acute cor pulmonale, unspecified chronicity (HCC)   2. Tachycardia   3. Pneumonia due to COVID-19 virus   4. COVID-19 virus infection   5. Chest pain of uncertain etiology    PLAN:    In order of problems listed above:  #Segmental Pulmonary Emboli: #COVID-19 Pneumonia: Continues to feel symptomatic after COVID infection with fatigue, generalized malaise and shortness of breath. Was previously followed by [place] clinic. Continues to intermittently require O2 at night. Has been tolerating eliquis well with no bleeding issues. Planned for 39month course. -Continue apixaban for planned 6 month course -Start PPI as below -Continue follow-up with PCP  #Chest Pain: Sharp in nature and not exertional. Low suspicion for cardiac etiology and CTA without significant calcification and TTE normal. Recommend starting nexium at this time as may be related to reflux. Plan to continue PPI while on apixaban. -Start nexium and continue while on apixaban -TTE with normal BiV function -CTA without significant coronary calcification to suggest CAD   Medication Adjustments/Labs and Tests Ordered: Current medicines are reviewed at length with the patient today.  Concerns regarding medicines are outlined above.  Orders Placed This Encounter  Procedures  . EKG 12-Lead   Meds ordered this encounter  Medications  . esomeprazole (NEXIUM) 40 MG capsule    Sig:  Take 1 capsule (40 mg total) by mouth daily at 12 noon.    Dispense:  30 capsule    Refill:  3    Patient Instructions  Medication Instructions:  Your physician has recommended you make the following change in your medication:  1.  START Nexium 40 mg taking 1 daily.    *If you need a refill on your cardiac medications before your next appointment, please call your pharmacy*   Lab Work: None ordered  If you have labs (blood work) drawn today and your tests are completely normal, you will receive your results only by: Marland Kitchen MyChart Message (if you have MyChart) OR . A paper copy in the mail If you have any lab test that is abnormal or  we need to change your treatment, we will call you to review the results.   Testing/Procedures: None ordered   Follow-Up: At [facility], you and your health needs are our priority.  As part of our continuing mission to provide you with exceptional heart care, we have created designated Provider Care Teams.  These Care Teams include your primary Cardiologist (physician) and Advanced Practice Providers (APPs -  Physician Assistants and Nurse Practitioners) who all work together to provide you with the care you need, when you need it.  We recommend signing up for the patient portal called "MyChart".  Sign up information is provided on this After Visit Summary.  MyChart is used to connect with patients for Virtual Visits (Telemedicine).  Patients are able to view lab/test results, encounter notes, upcoming appointments, etc.  Non-urgent messages can be sent to your provider as well.   To learn more about what you can do with MyChart, go to [email or web address].    Your next appointment:   AS NEEDED  The format for your next appointment:     Provider:      Other Instructions      Signed, Freada Bergeron, MD  05/29/2020 11:30 AM    [place]

## 2020-05-29 ENCOUNTER — Ambulatory Visit (INDEPENDENT_AMBULATORY_CARE_PROVIDER_SITE_OTHER): Payer: 59

## 2020-05-29 ENCOUNTER — Ambulatory Visit (INDEPENDENT_AMBULATORY_CARE_PROVIDER_SITE_OTHER): Payer: 59 | Admitting: Cardiology

## 2020-05-29 ENCOUNTER — Encounter: Payer: Self-pay | Admitting: Cardiology

## 2020-05-29 VITALS — BP 124/86 | HR 84 | Ht 61.0 in | Wt 241.0 lb

## 2020-05-29 DIAGNOSIS — R002 Palpitations: Secondary | ICD-10-CM | POA: Diagnosis not present

## 2020-05-29 DIAGNOSIS — U071 COVID-19: Secondary | ICD-10-CM | POA: Diagnosis not present

## 2020-05-29 DIAGNOSIS — I2699 Other pulmonary embolism without acute cor pulmonale: Secondary | ICD-10-CM

## 2020-05-29 DIAGNOSIS — R000 Tachycardia, unspecified: Secondary | ICD-10-CM

## 2020-05-29 DIAGNOSIS — J1282 Pneumonia due to coronavirus disease 2019: Secondary | ICD-10-CM

## 2020-05-29 DIAGNOSIS — R079 Chest pain, unspecified: Secondary | ICD-10-CM | POA: Diagnosis not present

## 2020-05-29 MED ORDER — ESOMEPRAZOLE MAGNESIUM 40 MG PO CPDR: 40.0000 mg | DELAYED_RELEASE_CAPSULE | Freq: Every day | ORAL | 3 refills | Status: DC

## 2020-05-29 NOTE — Patient Instructions (Signed)
Medication Instructions:  Your physician has recommended you make the following change in your medication:  1.  START Nexium 40 mg taking 1 daily.    *If you need a refill on your cardiac medications before your next appointment, please call your pharmacy*   Lab Work: None ordered  If you have labs (blood work) drawn today and your tests are completely normal, you will receive your results only by: Marland Kitchen MyChart Message (if you have MyChart) OR . A paper copy in the mail If you have any lab test that is abnormal or we need to change your treatment, we will call you to review the results.   Testing/Procedures: None ordered   Follow-Up: At [facility], you and your health needs are our priority.  As part of our continuing mission to provide you with exceptional heart care, we have created designated Provider Care Teams.  These Care Teams include your primary Cardiologist (physician) and Advanced Practice Providers (APPs -  Physician Assistants and Nurse Practitioners) who all work together to provide you with the care you need, when you need it.  We recommend signing up for the patient portal called "MyChart".  Sign up information is provided on this After Visit Summary.  MyChart is used to connect with patients for Virtual Visits (Telemedicine).  Patients are able to view lab/test results, encounter notes, upcoming appointments, etc.  Non-urgent messages can be sent to your provider as well.   To learn more about what you can do with MyChart, go to [email or web address].    Your next appointment:   AS NEEDED  The format for your next appointment:     Provider:      Other Instructions

## 2020-05-29 NOTE — Addendum Note (Signed)
Addended by: Burnetta Sabin on: 05/29/2020 01:26 PM   Modules accepted: Orders

## 2020-06-15 ENCOUNTER — Other Ambulatory Visit: Payer: Self-pay | Admitting: Nurse Practitioner

## 2020-07-12 ENCOUNTER — Encounter: Payer: Self-pay | Admitting: Nurse Practitioner

## 2020-07-12 ENCOUNTER — Ambulatory Visit (INDEPENDENT_AMBULATORY_CARE_PROVIDER_SITE_OTHER): Payer: 59 | Admitting: Nurse Practitioner

## 2020-07-12 ENCOUNTER — Other Ambulatory Visit: Payer: Self-pay

## 2020-07-12 ENCOUNTER — Ambulatory Visit
Admission: RE | Admit: 2020-07-12 | Discharge: 2020-07-12 | Disposition: A | Discharge status: Home / Self Care | Payer: 59 | Source: Ambulatory Visit | Attending: Nurse Practitioner | Admitting: Nurse Practitioner

## 2020-07-12 VITALS — BP 138/73 | HR 91 | Ht 61.0 in | Wt 245.0 lb

## 2020-07-12 DIAGNOSIS — U071 COVID-19: Secondary | ICD-10-CM | POA: Diagnosis not present

## 2020-07-12 DIAGNOSIS — J398 Other specified diseases of upper respiratory tract: Secondary | ICD-10-CM

## 2020-07-12 DIAGNOSIS — I2699 Other pulmonary embolism without acute cor pulmonale: Secondary | ICD-10-CM

## 2020-07-12 DIAGNOSIS — J1282 Pneumonia due to coronavirus disease 2019: Secondary | ICD-10-CM

## 2020-07-12 DIAGNOSIS — R739 Hyperglycemia, unspecified: Secondary | ICD-10-CM | POA: Diagnosis not present

## 2020-07-12 LAB — POCT GLYCOSYLATED HEMOGLOBIN (HGB A1C): Hemoglobin A1C: 5.7 % — AB (ref 4.0–5.6)

## 2020-07-12 MED ORDER — AMOXICILLIN-POT CLAVULANATE 875-125 MG PO TABS: 1.0000 | ORAL_TABLET | Freq: Two times a day (BID) | ORAL | 0 refills | Status: AC

## 2020-07-12 MED ORDER — FLUCONAZOLE 150 MG PO TABS: 150.0000 mg | ORAL_TABLET | Freq: Once | ORAL | 0 refills | Status: AC

## 2020-07-12 NOTE — Progress Notes (Signed)
   [facility] [address] [place], [place]  [address] Phone:  [phone]   Fax:  [phone] Virtual Visit via video note  I connected with Faith [name] on 07/12/20 at 10:00 AM EST by video and verified that I am speaking with the correct person using two identifiers.   I discussed the limitations, risks, security and privacy concerns of performing an evaluation and management service by telephone and the availability of in person appointments. I also discussed with the patient that there may be a patient responsible charge related to this service. The patient expressed understanding and agreed to proceed.  Patient home Provider Office  History of Present Illness: She presents for follow-up.  However due to her symptoms was encouraged to have a Covid test completed by staff. Review of Systems  Constitutional: Negative for chills and fever.  HENT: Positive for congestion.        Sneezing a little   Respiratory: Negative for cough.   Cardiovascular: Positive for chest pain (QOD  behind the heart she was sene by cardiology).       Chest pain in her lungs    Observations/Objective: Audible congestion the patient to have x-ray completed  Assessment and Plan: Assessment  Primary Diagnosis & Pertinent Problem List: The primary encounter diagnosis was Congestion of upper respiratory tract. Diagnoses of Blood glucose elevated, Other pulmonary embolism without acute cor pulmonale, unspecified chronicity (HCC), and Pneumonia due to COVID-19 virus were also pertinent to this visit.  Visit Diagnosis: 1. Congestion of upper respiratory tract  Worsening history of COVID-19 with pulmonary embolism We will have patient complete stat chest x-ray for further evaluation of symptoms Patient did also have COVID-19 test completed  2. Blood glucose elevated   3. Other pulmonary embolism without acute cor pulmonale, unspecified chronicity (HCC)  Stable we will continue  with current regimen of Eliquis  4. Pneumonia due to COVID-19 virus  Ongoing chest pain   Follow Up Instructions:    I discussed the assessment and treatment plan with the patient. The patient was provided an opportunity to ask questions and all were answered. The patient agreed with the plan and demonstrated an understanding of the instructions.   The patient was advised to call back or seek an in-person evaluation if the symptoms worsen or if the condition fails to improve as anticipated.  I provided 12 minutes of video- face-to-face time during this encounter.   Barbette Merino, NP

## 2020-07-13 ENCOUNTER — Encounter (HOSPITAL_COMMUNITY): Payer: Self-pay

## 2020-07-13 ENCOUNTER — Other Ambulatory Visit: Payer: Self-pay

## 2020-07-13 ENCOUNTER — Emergency Department (HOSPITAL_COMMUNITY)
Admission: EM | Admit: 2020-07-13 | Discharge: 2020-07-13 | Disposition: A | Discharge status: Home / Self Care | Payer: 59 | Attending: Emergency Medicine | Admitting: Emergency Medicine

## 2020-07-13 ENCOUNTER — Emergency Department (HOSPITAL_COMMUNITY): Payer: 59

## 2020-07-13 DIAGNOSIS — R000 Tachycardia, unspecified: Secondary | ICD-10-CM | POA: Insufficient documentation

## 2020-07-13 DIAGNOSIS — Z20822 Contact with and (suspected) exposure to covid-19: Secondary | ICD-10-CM | POA: Diagnosis not present

## 2020-07-13 DIAGNOSIS — Z7901 Long term (current) use of anticoagulants: Secondary | ICD-10-CM | POA: Insufficient documentation

## 2020-07-13 DIAGNOSIS — R0602 Shortness of breath: Secondary | ICD-10-CM

## 2020-07-13 DIAGNOSIS — J069 Acute upper respiratory infection, unspecified: Secondary | ICD-10-CM

## 2020-07-13 DIAGNOSIS — Z8616 Personal history of COVID-19: Secondary | ICD-10-CM | POA: Diagnosis not present

## 2020-07-13 DIAGNOSIS — I10 Essential (primary) hypertension: Secondary | ICD-10-CM | POA: Insufficient documentation

## 2020-07-13 DIAGNOSIS — R079 Chest pain, unspecified: Secondary | ICD-10-CM

## 2020-07-13 DIAGNOSIS — Z87891 Personal history of nicotine dependence: Secondary | ICD-10-CM | POA: Insufficient documentation

## 2020-07-13 DIAGNOSIS — R059 Cough, unspecified: Secondary | ICD-10-CM | POA: Diagnosis present

## 2020-07-13 LAB — TROPONIN I (HIGH SENSITIVITY)
Troponin I (High Sensitivity): <2 ng/L (ref <18)
Troponin I (High Sensitivity): <2 ng/L (ref <18)

## 2020-07-13 LAB — CBC
HCT: 35.9 % — ABNORMAL LOW (ref 36.0–46.0)
Hemoglobin: 10.9 g/dL — ABNORMAL LOW (ref 12.0–15.0)
MCH: 25.1 pg — ABNORMAL LOW (ref 26.0–34.0)
MCHC: 30.4 g/dL (ref 30.0–36.0)
MCV: 82.7 fL (ref 80.0–100.0)
Platelets: 363 10*3/uL (ref 150–400)
RBC: 4.34 MIL/uL (ref 3.87–5.11)
RDW: 13.9 % (ref 11.5–15.5)
WBC: 7.4 10*3/uL (ref 4.0–10.5)
nRBC: 0 % (ref 0.0–0.2)

## 2020-07-13 LAB — BASIC METABOLIC PANEL
Anion gap: 6 (ref 5–15)
BUN: <5 mg/dL — ABNORMAL LOW (ref 6–20)
CO2: 26 mmol/L (ref 22–32)
Calcium: 9.1 mg/dL (ref 8.9–10.3)
Chloride: 107 mmol/L (ref 98–111)
Creatinine, Ser: 0.63 mg/dL (ref 0.44–1.00)
GFR, Estimated: >60 mL/min (ref >60)
Glucose, Bld: 122 mg/dL — ABNORMAL HIGH (ref 70–99)
Potassium: 3.8 mmol/L (ref 3.5–5.1)
Sodium: 139 mmol/L (ref 135–145)

## 2020-07-13 LAB — RESP PANEL BY RT-PCR (FLU A&B, COVID) ARPGX2
Influenza A by PCR: NEGATIVE
Influenza B by PCR: NEGATIVE
SARS Coronavirus 2 by RT PCR: NEGATIVE

## 2020-07-13 LAB — I-STAT BETA HCG BLOOD, ED (MC, WL, AP ONLY): I-stat hCG, quantitative: <5 m[IU]/mL (ref <5)

## 2020-07-13 MED ORDER — BENZONATATE 200 MG PO CAPS: 200.0000 mg | ORAL_CAPSULE | Freq: Three times a day (TID) | ORAL | 0 refills | Status: DC

## 2020-07-13 MED ORDER — KETOROLAC TROMETHAMINE 30 MG/ML IJ SOLN
30.0000 mg | Freq: Once | INTRAMUSCULAR | Status: AC
  Administered 2020-07-13: 30 mg via INTRAVENOUS
  Filled 2020-07-13: qty 1

## 2020-07-13 MED ORDER — ALBUTEROL SULFATE HFA 108 (90 BASE) MCG/ACT IN AERS
2.0000 | INHALATION_SPRAY | Freq: Once | RESPIRATORY_TRACT | Status: AC
  Administered 2020-07-13: 2 via RESPIRATORY_TRACT
  Filled 2020-07-13: qty 6.7

## 2020-07-13 MED ORDER — IOHEXOL 350 MG/ML SOLN
100.0000 mL | Freq: Once | INTRAVENOUS | Status: AC | PRN
  Administered 2020-07-13: 59 mL via INTRAVENOUS

## 2020-07-13 NOTE — ED Provider Notes (Signed)
Faith [name]   CSN: [id number] Arrival date & time: 07/13/20  1111     History Chief Complaint  Patient presents with  . Chest Pain  . Shortness of Breath    Faith [name] is a 30 y.o. female.  Faith [name] is a 30 y.o. female with a history of previous Covid infection, PE, morbid obesity, who presents to the emergency department for evaluation of chest pain, shortness of breath, cough.  Patient reports over the past 3 to 4 days she has been having nasal congestion, rhinorrhea and cough and has started to feel quite poorly.  She called her regular doctor, they had her come in for a chest x-ray which showed a possible area of pneumonia and so she was started on Augmentin.  Patient had COVID in February, and developed PEs from this, was placed on Eliquis which she has been taking regularly and has not missed any doses.  Patient reports that she had a long COVID course and has continued to have some chest pain and pain with respiration since then with this we had become much more mild and was improving until the past few days when symptoms seem to get worse, she reports pain with movement as well as with inspiration.  She also reports feeling short of breath.  She has tried some over-the-counter cold medications and has been taking the antibiotics as prescribed by her doctor since yesterday.  She reports despite this she is still feeling poorly and was very worried especially given her complicated course with Covid and PE's.         Past Medical History:  Diagnosis Date  . Chlamydia   . Hypertension   . Infection    UTI  . Morbid obesity ([place])   . Urinary tract infection   . Vaginal Pap smear, abnormal    f/u was ok    Patient Active Problem List   Diagnosis Date Noted  . Pneumonia due to COVID-19 virus 04/23/2020  . History of COVID-19 04/23/2020  . Tachycardia 04/23/2020  . COVID-19 virus infection 04/02/2020   . Pulmonary embolism ([place]) 04/02/2020  . Morbid obesity ([place])   . Abnormal Pap smear of vagina 01/31/2016    Past Surgical History:  Procedure Laterality Date  . CESAREAN SECTION    . COLPOSCOPY    . DILATION AND EVACUATION N/A 10/04/2012   Procedure: DILATATION AND EVACUATION;  Surgeon: Guss Bunde, MD;  Location: [facility];  Service: Gynecology;  Laterality: N/A;  . LEEP  11/2016     OB History    Gravida  4   Para  3   Term  3   Preterm  0   AB  1   Living  3     SAB  0   IAB  1   Ectopic  0   Multiple  0   Live Births  3           Family History  Problem Relation Age of Onset  . Diabetes Sister   . Hypertension Paternal Grandmother   . Cancer Paternal Grandfather   . Anesthesia problems Neg Hx   . Hearing loss Neg Hx     Social History   Tobacco Use  . Smoking status: Former Smoker    Packs/day: 0.25    Quit date: 04/05/2015    Years since quitting: 5.2  . Smokeless tobacco: Never Used  Vaping Use  . Vaping Use: Never  used  Substance Use Topics  . Alcohol use: Not Currently  . Drug use: Never    Home Medications Prior to Admission medications   Medication Sig Start Date End Date Taking? Authorizing Provider  acetaminophen (TYLENOL) 500 MG tablet Take 500 mg by mouth every 6 (six) hours as needed for moderate pain or headache.   Yes [provider]  albuterol (VENTOLIN HFA) 108 (90 Base) MCG/ACT inhaler Inhale 2 puffs into the lungs every 6 (six) hours as needed for wheezing or shortness of breath. 04/11/20  Yes Thurnell Lose, MD  amoxicillin-clavulanate (AUGMENTIN) 875-125 MG tablet Take 1 tablet by mouth 2 (two) times daily for 10 days. 07/12/20 07/22/20 Yes Vevelyn Francois, NP  apixaban (ELIQUIS) 5 MG TABS tablet Take 1 tablet (5 mg total) by mouth 2 (two) times daily. 05/08/20 08/06/20 Yes Fenton Foy, NP  benzonatate (TESSALON) 200 MG capsule Take 1 capsule (200 mg total) by mouth every 8 (eight) hours. 07/13/20  Yes Jacqlyn Larsen, PA-C  blood glucose meter kit and supplies Dispense based on patient and insurance preference. Use up to four times daily as directed. (FOR ICD-10 E10.9, E11.9). 05/14/20  Yes Vevelyn Francois, NP  esomeprazole (NEXIUM) 40 MG capsule Take 1 capsule (40 mg total) by mouth daily at 12 noon. 05/29/20  Yes Freada Bergeron, MD    Allergies    Patient has no known allergies.  Review of Systems   Review of Systems  Constitutional: Positive for fatigue. Negative for chills and fever.  HENT: Positive for congestion and rhinorrhea. Negative for sore throat.   Respiratory: Positive for cough, chest tightness and shortness of breath.   Cardiovascular: Positive for chest pain.  Gastrointestinal: Negative for abdominal pain, diarrhea, nausea and vomiting.  Genitourinary: Negative for dysuria.  Musculoskeletal: Negative for arthralgias and myalgias.  Skin: Negative for color change and rash.  Neurological: Negative for dizziness, syncope, light-headedness and headaches.  All other systems reviewed and are negative.   Physical Exam Updated Vital Signs BP (!) 144/76 (BP Location: Right Arm)   Pulse 92   Temp 97.7 F (36.5 C)   Resp (!) 22   SpO2 97%   Physical Exam Vitals and nursing [name] reviewed.  Constitutional:      General: She is not in acute distress.    Appearance: She is well-developed. She is obese. She is ill-appearing. She is not diaphoretic.     Comments: Alert, somewhat ill-appearing, no acute distress  HENT:     Head: Normocephalic and atraumatic.     Nose: Congestion and rhinorrhea present.     Mouth/Throat:     Mouth: Mucous membranes are moist.     Pharynx: Oropharynx is clear.     Comments: Posterior oropharynx clear and mucous membranes moist, there is mild erythema but no edema or tonsillar exudates, uvula midline, normal phonation, no trismus, tolerating secretions without difficulty. Eyes:     General:        Right eye: No discharge.        Left eye:  No discharge.  Cardiovascular:     Rate and Rhythm: Normal rate and regular rhythm.     Heart sounds: Normal heart sounds. No murmur heard. No friction rub. No gallop.   Pulmonary:     Effort: No respiratory distress.     Comments: Patient is mildly tachypneic with some increased respiratory effort, able to speak in full sentences and currently maintaining normal O2 sats, on auscultation some upper  airway sounds noted, no wheezes, rales or rhonchi.  Frequent cough during exam Abdominal:     General: Bowel sounds are normal. There is no distension.     Palpations: Abdomen is soft. There is no mass.     Tenderness: There is no abdominal tenderness. There is no guarding.     Comments: Abdomen soft, nondistended, nontender to palpation in all quadrants without guarding or peritoneal signs   Musculoskeletal:        General: No tenderness or deformity.     Cervical back: Neck supple.     Right lower leg: No edema.     Left lower leg: No edema.     Comments: Bilateral lower extremities without edema or tenderness.  Skin:    General: Skin is warm and dry.     Capillary Refill: Capillary refill takes less than 2 seconds.  Neurological:     Mental Status: She is alert.     Coordination: Coordination normal.     Comments: Speech is clear, able to follow commands Moves extremities without ataxia, coordination intact  Psychiatric:        Mood and Affect: Mood normal.        Behavior: Behavior normal.     ED Results / Procedures / Treatments   Labs (all labs ordered are listed, but only abnormal results are displayed) Labs Reviewed  BASIC METABOLIC PANEL - Abnormal; Notable for the following components:      Result Value   Glucose, Bld 122 (*)    BUN <5 (*)    All other components within normal limits  CBC - Abnormal; Notable for the following components:   Hemoglobin 10.9 (*)    HCT 35.9 (*)    MCH 25.1 (*)    All other components within normal limits  RESP PANEL BY RT-PCR (FLU A&B,  COVID) ARPGX2  I-STAT BETA HCG BLOOD, ED ([facility], [facility], [facility] ONLY)  TROPONIN I (HIGH SENSITIVITY)  TROPONIN I (HIGH SENSITIVITY)    EKG EKG Interpretation  Date/Time:  Friday July 13 2020 11:14:03 EST Ventricular Rate:  102 PR Interval:  144 QRS Duration: 90 QT Interval:  346 QTC Calculation: 450 R Axis:   74 Text Interpretation: Sinus tachycardia Otherwise normal ECG Confirmed by Orpah Greek [phone]) on 07/14/2020 8:55:06 AM   Radiology DG Chest 2 View  Result Date: 07/12/2020 CLINICAL DATA:  Chest tightness, congestive and right-sided pain for several days. EXAM: CHEST - 2 VIEW COMPARISON:  Chest radiograph April 10, 2020. FINDINGS: The heart size and mediastinal contours are within normal limits. Hazy right basilar opacity no pleural effusion. No pneumothorax. The visualized skeletal structures are unremarkable. IMPRESSION: Hazy right basilar opacity, which may represent atelectasis versus pneumonia. Electronically Signed   By: Dahlia Bailiff MD   On: 07/12/2020 13:21   CT Angio Chest PE W and/or Wo Contrast  Result Date: 07/13/2020 CLINICAL DATA:  Worsening chest pain over the last 4 days. Previous coronavirus infection with previous pulmonary emboli. EXAM: CT ANGIOGRAPHY CHEST WITH CONTRAST TECHNIQUE: Multidetector CT imaging of the chest was performed using the standard protocol during bolus administration of intravenous contrast. Multiplanar CT image reconstructions and MIPs were obtained to evaluate the vascular anatomy. CONTRAST:  63mL OMNIPAQUE IOHEXOL 350 MG/ML SOLN COMPARISON:  04/02/2020 FINDINGS: Cardiovascular: Heart size is normal. No pericardial fluid. No coronary artery calcification or aortic atherosclerotic calcification. Previously seen pulmonary emboli have lysed. No sign of residual finding or new pulmonary emboli. Mediastinum/Nodes: No mass or adenopathy.  Some  residual thymus. Lungs/Pleura: Lungs are now clear except for minimal areas of scarring at both lung  bases. Resolution of the majority of the patchy bilateral pulmonary infiltrates seen previously. Upper Abdomen: Normal Musculoskeletal: Normal Review of the MIP images confirms the above findings. IMPRESSION: 1. Resolution of previously seen pulmonary emboli. No sign of residual finding or new pulmonary emboli. 2. Resolution of the majority of the patchy bilateral pulmonary infiltrates seen previously. Minimal areas of scarring at both lung bases. Electronically Signed   By: Nelson Chimes M.D.   On: 07/13/2020 14:18    Procedures Procedures   Medications Ordered in ED Medications  iohexol (OMNIPAQUE) 350 MG/ML injection 100 mL (59 mLs Intravenous Contrast Given 07/13/20 1351)  ketorolac (TORADOL) 30 MG/ML injection 30 mg (30 mg Intravenous Given 07/13/20 1435)  albuterol (VENTOLIN HFA) 108 (90 Base) MCG/ACT inhaler 2 puff (2 puffs Inhalation Given 07/13/20 1435)    ED Course  I have reviewed the triage vital signs and the nursing notes.  Pertinent labs & imaging results that were available during my care of the patient were reviewed by me and considered in my medical decision making (see chart for details).    MDM Rules/Calculators/A&P                         30 year old female with recent Covid infection with PE, who presents with worsening chest pain, shortness of breath, productive cough and rhinorrhea.  On arrival patient is afebrile, but is somewhat ill-appearing and is tachypneic with some increased respiratory effort.  Lungs overall clear but slightly diminished, no focal wheezing.  Had been starting to slowly improve with Covid infection, was still having chest pain but then symptoms suddenly worsened over the past 3 to 4 days when patient started having associated productive cough and rhinorrhea.  Patient seen at PCP office and had chest x-ray done with questionable area of pneumonia, was started on antibiotics, but no improvement in symptoms.  Patient does seem to be having pleuritic chest  pain has been compliant with Eliquis but given worsening symptoms raise concern for recurrent PE potentially.  Will evaluate with basic labs, troponin, repeat Covid test and CT angio.  Will treat supportively with albuterol and Toradol while pending work-up.  I have independently ordered, reviewed and interpreted all labs and imaging: CBC: No leukocytosis, stable hemoglobin BMP: Glucose of 122, no other electrolyte derangements and normal renal function Troponin negative x2 Pregnancy: Negative  Covid & Flu panel: Negative  EKG shows sinus tachycardia, otherwise unremarkable  CT angio shows resolution of prior PEs and no residual findings or new pulmonary emboli.  Resolution of the majority of the patchy bilateral pulmonary infiltrates seen previously.  Minimal areas of scarring noted at both lung bases.  CT scan is very reassuring, no evidence for recurrent PEs and no current signs of pneumonia it appears that prior Covid pneumonia has resolved.  Suspect patient may have a viral upper respiratory infection leading to some pleurisy given that patient symptoms were just starting to slowly improve.  Ambulatory in the emergency department and maintained normal O2 sats.  Discussed supportive treatment, NSAIDs, Tylenol, hydration, will also prescribe Tessalon Perles to help with cough and have patient follow closely with your PCP.  Strict return precautions provided.  Patient expresses understanding and agreement.  Discharged home in good condition.  Final Clinical Impression(s) / ED Diagnoses Final diagnoses:  Central chest pain  Shortness of breath  URI with cough and congestion  Rx / DC Orders ED Discharge Orders         Ordered    benzonatate (TESSALON) 200 MG capsule  Every 8 hours        07/13/20 1531           Janet Berlin 07/17/20 1414    Luna Fuse, MD 07/18/20 [phone]

## 2020-07-13 NOTE — Discharge Instructions (Signed)
Your evaluation today has been very reassuring with no evidence of PE, these have all resolved, please continue blood thinner.  Your chest CT also shows that Covid pneumonia has resolved and I see no signs of a new pneumonia today, you may discontinue antibiotics.  I suspect you likely have a viral upper respiratory infection that has worsened some remaining inflammation from your Covid infection since you were still having some chest pain that worsened over the past few days with these new symptoms.  Your Covid and flu test today were negative.  To help with cough please use albuterol inhaler every 4 hours as needed.  You can also use prescribed cough medication.  To help with pain you may take Tylenol 1000 mg every 6 hours.  You can also use over-the-counter decongestants like Flonase and Zyrtec to help with nasal congestion, you can also use saline nasal rinses.  Please follow closely with your primary care doctor.  If you develop fevers, worsening chest pain or shortness of breath or any other new or concerning symptoms please return for reevaluation.

## 2020-07-13 NOTE — ED Notes (Signed)
Pt was 99% after walking to bathroom.

## 2020-07-13 NOTE — ED Triage Notes (Signed)
Pt reports worsening chest pain and sob since Monday. Pt recovering from having COVID with PE in November, is currently on Eliquis. Resp labored in triage. Pt had virtual visit with PCP yesterday and had an xray done, showing possible pneumonia. Pt is fully vaccinated now, second dose on 3/8.

## 2020-07-19 ENCOUNTER — Telehealth: Payer: Self-pay

## 2020-07-19 NOTE — Telephone Encounter (Signed)
Forwarding to [employer]

## 2020-07-19 NOTE — Telephone Encounter (Signed)
Forwarding

## 2020-07-19 NOTE — Telephone Encounter (Signed)
Wanted to do detox tea if it will affect Eliquis medicine and nexium

## 2020-07-20 NOTE — Telephone Encounter (Signed)
Called and advised patient of this information

## 2020-07-20 NOTE — Telephone Encounter (Signed)
Please indicate to Ms. Tschirhart that I would avoid doing any kind type of detox with the use of Eliquis. Thanks

## 2020-08-01 ENCOUNTER — Other Ambulatory Visit: Payer: Self-pay | Admitting: Nurse Practitioner

## 2020-08-15 ENCOUNTER — Ambulatory Visit (INDEPENDENT_AMBULATORY_CARE_PROVIDER_SITE_OTHER): Payer: 59 | Admitting: Nurse Practitioner

## 2020-08-15 ENCOUNTER — Encounter: Payer: Self-pay | Admitting: Nurse Practitioner

## 2020-08-15 ENCOUNTER — Other Ambulatory Visit: Payer: Self-pay

## 2020-08-15 VITALS — BP 120/64 | HR 80 | Temp 98.1°F | Ht 61.0 in | Wt 241.0 lb

## 2020-08-15 DIAGNOSIS — R000 Tachycardia, unspecified: Secondary | ICD-10-CM

## 2020-08-15 DIAGNOSIS — Z13220 Encounter for screening for lipoid disorders: Secondary | ICD-10-CM

## 2020-08-15 DIAGNOSIS — R079 Chest pain, unspecified: Secondary | ICD-10-CM

## 2020-08-15 DIAGNOSIS — R7303 Prediabetes: Secondary | ICD-10-CM | POA: Diagnosis not present

## 2020-08-15 DIAGNOSIS — M542 Cervicalgia: Secondary | ICD-10-CM

## 2020-08-15 NOTE — Progress Notes (Signed)
Established Patient Office Visit  Subjective:  Patient ID: Faith [name], female    DOB: [birth year]  Age: 30 y.o. MRN: [id number]  CC:  Chief Complaint  Patient presents with  . Hospitalization Follow-up    Hospital follow , pt had covid in nov 2021, having chest pain  on the rt side , in back ,  she has been chest pain since she had covid.     HPI Faith [name] presents for episodic visit Chest Pain. Pt sates that pain is 7/10 radiating down left arm and right chest. Pain is worse when pressure is applied to area. Pt was diagnosed with Covid and bilateral pulmonary emboli in November 2021.   Past Medical History:  Diagnosis Date  . Chlamydia   . Hypertension   . Infection    UTI  . Morbid obesity ([place])   . Urinary tract infection   . Vaginal Pap smear, abnormal    f/u was ok    Past Surgical History:  Procedure Laterality Date  . CESAREAN SECTION    . COLPOSCOPY    . DILATION AND EVACUATION N/A 10/04/2012   Procedure: DILATATION AND EVACUATION;  Surgeon: Guss Bunde, MD;  Location: [facility];  Service: Gynecology;  Laterality: N/A;  . LEEP  11/2016    Family History  Problem Relation Age of Onset  . Diabetes Sister   . Hypertension Paternal Grandmother   . Cancer Paternal Grandfather   . Anesthesia problems Neg Hx   . Hearing loss Neg Hx     Social History   Socioeconomic History  . Marital status: Single    Spouse name: Not on file  . Number of children: Not on file  . Years of education: Not on file  . Highest education level: Not on file  Occupational History  . Not on file  Tobacco Use  . Smoking status: Former Smoker    Packs/day: 0.25    Quit date: 04/05/2015    Years since quitting: 5.3  . Smokeless tobacco: Never Used  Vaping Use  . Vaping Use: Never used  Substance and Sexual Activity  . Alcohol use: Not Currently  . Drug use: Never  . Sexual activity: Yes    Partners: Male    Birth control/protection: None  Other Topics  Concern  . Not on file  Social History Narrative  . Not on file   Social Determinants of Health   Financial Resource Strain: Not on file  Food Insecurity: Not on file  Transportation Needs: Not on file  Physical Activity: Not on file  Stress: Not on file  Social Connections: Not on file  Intimate Partner Violence: Not on file    Outpatient Medications Prior to Visit  Medication Sig Dispense Refill  . acetaminophen (TYLENOL) 500 MG tablet Take 500 mg by mouth every 6 (six) hours as needed for moderate pain or headache.    . albuterol (VENTOLIN HFA) 108 (90 Base) MCG/ACT inhaler INHALE 2 PUFFS INTO THE LUNGS EVERY SIX HOURS AS NEEDED FOR WHEEZING OR SHORTNESS OF BREATH. 18 g 0  . blood glucose meter kit and supplies Dispense based on patient and insurance preference. Use up to four times daily as directed. (FOR ICD-10 E10.9, E11.9). 1 each 0  . ELIQUIS 5 MG TABS tablet TAKE 1 TABLET BY MOUTH TWICE A DAY 60 tablet 2  . esomeprazole (NEXIUM) 40 MG capsule Take 1 capsule (40 mg total) by mouth daily at 12 noon. 30 capsule 3  .  benzonatate (TESSALON) 200 MG capsule Take 1 capsule (200 mg total) by mouth every 8 (eight) hours. 30 capsule 0   No facility-administered medications prior to visit.    No Known Allergies  ROS Review of Systems  Constitutional: Negative.   HENT: Negative.   Eyes: Negative.   Respiratory: Positive for shortness of breath (worsened by activity). Negative for cough.   Cardiovascular: Positive for chest pain (radiates down left arm accompained by weakness. 1-2 times weekly). Negative for palpitations and leg swelling.  Gastrointestinal: Negative for nausea.  Musculoskeletal: Positive for neck pain (with CP sypmtoms).  Skin: Negative.       Objective:    Physical Exam  BP 120/64 (BP Location: Left Arm, Patient Position: Sitting, Cuff Size: Large)   Pulse 80   Temp 98.1 F (36.7 C) (Temporal)   Ht _0  (1.549 m)   Wt 241 lb (109.3 kg)   LMP  08/06/2020   SpO2 98%   BMI 45.54 kg/m  Wt Readings from Last 3 Encounters:  08/15/20 241 lb (109.3 kg)  07/12/20 245 lb (111.1 kg)  05/29/20 241 lb (109.3 kg)     Health Maintenance Due  Topic Date Due  . PAP SMEAR-Modifier  10/29/2019    There are no preventive care reminders to display for this patient.  Lab Results  Component Value Date   TSH 0.605 05/14/2020   Lab Results  Component Value Date   WBC 7.4 07/13/2020   HGB 10.9 (L) 07/13/2020   HCT 35.9 (L) 07/13/2020   MCV 82.7 07/13/2020   PLT 363 07/13/2020   Lab Results  Component Value Date   NA 139 07/13/2020   K 3.8 07/13/2020   CO2 26 07/13/2020   GLUCOSE 122 (H) 07/13/2020   BUN <5 (L) 07/13/2020   CREATININE 0.63 07/13/2020   BILITOT <0.2 05/14/2020   ALKPHOS 68 05/14/2020   AST 11 05/14/2020   ALT 22 04/30/2020   PROT 7.3 05/14/2020   ALBUMIN 4.4 05/14/2020   CALCIUM 9.1 07/13/2020   ANIONGAP 6 07/13/2020   No results found for: CHOL No results found for: HDL No results found for: LDLCALC No results found for: TRIG No results found for: CHOLHDL Lab Results  Component Value Date   HGBA1C 5.7 (A) 07/12/2020      Assessment & Plan:   Problem List Items Addressed This Visit      Other   Tachycardia - Primary   Relevant Orders   EKG 12-Lead      No orders of the defined types were placed in this encounter.   Follow-up: No follow-ups on file.    Hermina Staggers, RN

## 2020-08-15 NOTE — Progress Notes (Signed)
[place], [place]  [address] Phone:  [phone]   Fax:  [phone]   Established Patient Office Visit  Subjective:  Patient ID: Faith [name], female    DOB: April 12, 1991  Age: 30 y.o. MRN: [id number]  CC:  Chief Complaint  Patient presents with  . Hospitalization Follow-up    Hospital follow , pt had covid in nov 2021, having chest pain  on the rt side , in back ,  she has been chest pain since she had covid.     HPI Faith [name] presents for follow-up.  She  has a past medical history of Chlamydia, Hypertension, Infection, Morbid obesity ([place]), Urinary tract infection, and Vaginal Pap smear, abnormal.   She is SP COVID-19 with pneumonia and PE . She continues have pneumonia in her right lower lobe. She is having right sided back pain. She continues to have shortness of breath with walking and chest pain.  She is having  chest pain. She feels like the chest pain is getting worse.  She was seen in the emergency room.  Her CT was negative for pulmonary emboli have lysed. She is unsure of the extent of heart disease in her family.    Past Medical History:  Diagnosis Date  . Chlamydia   . Hypertension   . Infection    UTI  . Morbid obesity ([place])   . Urinary tract infection   . Vaginal Pap smear, abnormal    f/u was ok    Past Surgical History:  Procedure Laterality Date  . CESAREAN SECTION    . COLPOSCOPY    . DILATION AND EVACUATION N/A 10/04/2012   Procedure: DILATATION AND EVACUATION;  Surgeon: Guss Bunde, MD;  Location: [facility];  Service: Gynecology;  Laterality: N/A;  . LEEP  11/2016    Family History  Problem Relation Age of Onset  . Diabetes Sister   . Hypertension Paternal Grandmother   . Cancer Paternal Grandfather   . Anesthesia problems Neg Hx   . Hearing loss Neg Hx     Social History   Socioeconomic History  . Marital status: Single    Spouse name: Not on file  . Number of children:  Not on file  . Years of education: Not on file  . Highest education level: Not on file  Occupational History  . Not on file  Tobacco Use  . Smoking status: Former Smoker    Packs/day: 0.25    Quit date: 04/05/2015    Years since quitting: 5.3  . Smokeless tobacco: Never Used  Vaping Use  . Vaping Use: Never used  Substance and Sexual Activity  . Alcohol use: Not Currently  . Drug use: Never  . Sexual activity: Yes    Partners: Male    Birth control/protection: None  Other Topics Concern  . Not on file  Social History Narrative  . Not on file   Social Determinants of Health   Financial Resource Strain: Not on file  Food Insecurity: Not on file  Transportation Needs: Not on file  Physical Activity: Not on file  Stress: Not on file  Social Connections: Not on file  Intimate Partner Violence: Not on file    Outpatient Medications Prior to Visit  Medication Sig Dispense Refill  . acetaminophen (TYLENOL) 500 MG tablet Take 500 mg by mouth every 6 (six) hours as needed for moderate pain or headache.    . albuterol (VENTOLIN HFA)  108 (90 Base) MCG/ACT inhaler INHALE 2 PUFFS INTO THE LUNGS EVERY SIX HOURS AS NEEDED FOR WHEEZING OR SHORTNESS OF BREATH. 18 g 0  . blood glucose meter kit and supplies Dispense based on patient and insurance preference. Use up to four times daily as directed. (FOR ICD-10 E10.9, E11.9). 1 each 0  . ELIQUIS 5 MG TABS tablet TAKE 1 TABLET BY MOUTH TWICE A DAY 60 tablet 2  . esomeprazole (NEXIUM) 40 MG capsule Take 1 capsule (40 mg total) by mouth daily at 12 noon. 30 capsule 3  . benzonatate (TESSALON) 200 MG capsule Take 1 capsule (200 mg total) by mouth every 8 (eight) hours. 30 capsule 0   No facility-administered medications prior to visit.    No Known Allergies  ROS Review of Systems  HENT: Negative.   Respiratory: Positive for shortness of breath (with walking ). Negative for chest tightness and wheezing.        Parttime  Working 3 hours per  day a few days per weak  Cardiovascular: Positive for chest pain (Sunday she has had some weakness in the left arm. She is also having weakness. She rates the pain 7/10  ).       She has had a cardiac   Gastrointestinal: Negative for nausea.  Musculoskeletal: Positive for neck pain (the pain radiated up into the neck ).      Objective:    Physical Exam HENT:     Head: Normocephalic.     Nose: Nose normal.     Mouth/Throat:     Mouth: Mucous membranes are moist.  Cardiovascular:     Rate and Rhythm: Normal rate and regular rhythm.     Pulses: Normal pulses.     Heart sounds: Normal heart sounds.  Pulmonary:     Effort: Pulmonary effort is normal.     Breath sounds: Normal breath sounds.  Musculoskeletal:        General: Normal range of motion.     Cervical back: Normal range of motion.     Right lower leg: No edema.     Left lower leg: No edema.  Skin:    General: Skin is warm and dry.     Capillary Refill: Capillary refill takes less than 2 seconds.  Neurological:     General: No focal deficit present.     Mental Status: She is alert and oriented to person, place, and time.     BP 120/64 (BP Location: Left Arm, Patient Position: Sitting, Cuff Size: Large)   Pulse 80   Temp 98.1 F (36.7 C) (Temporal)   Ht $R'5\' 1"'uB$  (1.549 m)   Wt 241 lb (109.3 kg)   LMP 08/06/2020   SpO2 98%   BMI 45.54 kg/m  Wt Readings from Last 3 Encounters:  08/15/20 241 lb (109.3 kg)  07/12/20 245 lb (111.1 kg)  05/29/20 241 lb (109.3 kg)     Health Maintenance Due  Topic Date Due  . PAP SMEAR-Modifier  10/29/2019    There are no preventive care reminders to display for this patient.  Lab Results  Component Value Date   TSH 0.605 05/14/2020   Lab Results  Component Value Date   WBC 7.4 07/13/2020   HGB 10.9 (L) 07/13/2020   HCT 35.9 (L) 07/13/2020   MCV 82.7 07/13/2020   PLT 363 07/13/2020   Lab Results  Component Value Date   NA 139 07/13/2020   K 3.8 07/13/2020   CO2 26  07/13/2020  GLUCOSE 122 (H) 07/13/2020   BUN <5 (L) 07/13/2020   CREATININE 0.63 07/13/2020   BILITOT <0.2 05/14/2020   ALKPHOS 68 05/14/2020   AST 11 05/14/2020   ALT 22 04/30/2020   PROT 7.3 05/14/2020   ALBUMIN 4.4 05/14/2020   CALCIUM 9.1 07/13/2020   ANIONGAP 6 07/13/2020   Lab Results  Component Value Date   CHOL 157 08/15/2020   Lab Results  Component Value Date   HDL 43 08/15/2020   Lab Results  Component Value Date   LDLCALC 96 08/15/2020   Lab Results  Component Value Date   TRIG 99 08/15/2020   Lab Results  Component Value Date   CHOLHDL 3.7 08/15/2020   Lab Results  Component Value Date   HGBA1C 5.7 (A) 07/12/2020      Assessment & Plan:   Problem List Items Addressed This Visit      Other   Chest pain Encouraged follow-up with cardiology for further evaluation with stress test due to her obesity and family history   Relevant Orders   EKG 12-Lead   Tachycardia -     Other Visit Diagnoses    Screening for cholesterol level       Relevant Orders   Prediabetes     Consider home glucose monitoring Weight loss at least 5% of current body weight is can be achieved with lifestyle modification dietary changes and regular daily exercise Encourage blood pressure control goal <120/80 and maintaining total cholesterol <200 Follow-up every 3 to 6 months for reevaluation Education material provided    Relevant Orders   POCT glycosylated hemoglobin (Hb A1C)   Neck pain   Persistent  X-ray for further evaluation   Relevant Orders   DG Cervical Spine Complete      No orders of the defined types were placed in this encounter.   Follow-up: No follow-ups on file.    Vevelyn Francois, NP

## 2020-08-16 DIAGNOSIS — R079 Chest pain, unspecified: Secondary | ICD-10-CM | POA: Insufficient documentation

## 2020-08-16 LAB — COMP. METABOLIC PANEL (12)
AST: 21 IU/L (ref 0–40)
Albumin/Globulin Ratio: 1.6 (ref 1.2–2.2)
Albumin: 4.5 g/dL (ref 3.8–4.8)
Alkaline Phosphatase: 77 IU/L (ref 44–121)
BUN/Creatinine Ratio: 10 (ref 9–23)
BUN: 7 mg/dL (ref 6–20)
Bilirubin Total: <0.2 mg/dL (ref 0.0–1.2)
Calcium: 9.8 mg/dL (ref 8.7–10.2)
Chloride: 102 mmol/L (ref 96–106)
Creatinine, Ser: 0.7 mg/dL (ref 0.57–1.00)
Globulin, Total: 2.9 g/dL (ref 1.5–4.5)
Glucose: 82 mg/dL (ref 65–99)
Potassium: 5.1 mmol/L (ref 3.5–5.2)
Sodium: 141 mmol/L (ref 134–144)
Total Protein: 7.4 g/dL (ref 6.0–8.5)
eGFR: 119 mL/min/{1.73_m2} (ref >59)

## 2020-08-16 LAB — LIPID PANEL
Chol/HDL Ratio: 3.7 ratio (ref 0.0–4.4)
Cholesterol, Total: 157 mg/dL (ref 100–199)
HDL: 43 mg/dL (ref >39)
LDL Chol Calc (NIH): 96 mg/dL (ref 0–99)
Triglycerides: 99 mg/dL (ref 0–149)
VLDL Cholesterol Cal: 18 mg/dL (ref 5–40)

## 2020-08-16 LAB — CBC WITH DIFFERENTIAL/PLATELET
Basophils Absolute: 0 10*3/uL (ref 0.0–0.2)
Basos: 0 %
EOS (ABSOLUTE): 0.2 10*3/uL (ref 0.0–0.4)
Eos: 3 %
Hematocrit: 34.5 % (ref 34.0–46.6)
Hemoglobin: 10.9 g/dL — ABNORMAL LOW (ref 11.1–15.9)
Immature Grans (Abs): 0 10*3/uL (ref 0.0–0.1)
Immature Granulocytes: 1 %
Lymphocytes Absolute: 2.7 10*3/uL (ref 0.7–3.1)
Lymphs: 34 %
MCH: 24.8 pg — ABNORMAL LOW (ref 26.6–33.0)
MCHC: 31.6 g/dL (ref 31.5–35.7)
MCV: 78 fL — ABNORMAL LOW (ref 79–97)
Monocytes Absolute: 0.5 10*3/uL (ref 0.1–0.9)
Monocytes: 6 %
Neutrophils Absolute: 4.4 10*3/uL (ref 1.4–7.0)
Neutrophils: 56 %
Platelets: 374 10*3/uL (ref 150–450)
RBC: 4.4 x10E6/uL (ref 3.77–5.28)
RDW: 13.6 % (ref 11.7–15.4)
WBC: 7.9 10*3/uL (ref 3.4–10.8)

## 2020-08-16 LAB — TSH: TSH: 1.15 u[IU]/mL (ref 0.450–4.500)

## 2021-04-15 ENCOUNTER — Other Ambulatory Visit: Payer: Self-pay

## 2021-04-15 ENCOUNTER — Encounter: Payer: Self-pay | Admitting: Nurse Practitioner

## 2021-04-15 ENCOUNTER — Ambulatory Visit (INDEPENDENT_AMBULATORY_CARE_PROVIDER_SITE_OTHER): Payer: 59 | Admitting: Nurse Practitioner

## 2021-04-15 VITALS — BP 131/74 | HR 78 | Temp 98.7°F | Ht 61.0 in | Wt 226.6 lb

## 2021-04-15 DIAGNOSIS — Z01419 Encounter for gynecological examination (general) (routine) without abnormal findings: Secondary | ICD-10-CM | POA: Diagnosis not present

## 2021-04-15 DIAGNOSIS — Z7189 Other specified counseling: Secondary | ICD-10-CM

## 2021-04-15 NOTE — Patient Instructions (Signed)
Breast Self-Awareness Breast self-awareness means being familiar with how your breasts look and feel. It involves checking your breasts regularly and reporting any changes to your health care provider. Practicing breast self-awareness is important. Sometimes changes may not be harmful (are benign), but sometimes a change in your breasts can be a sign of a serious medical problem. It is important to learn how to do this procedure correctly so that you can catch problems early, when treatment is more likely to be successful. All women should practice breast self-awareness, including women who have had breast implants. What you need: A mirror. A well-lit room. How to do a breast self-exam A breast self-exam is one way to learn what is normal for your breasts and whether your breasts are changing. To do a breast self-exam: Look for changes  Remove all the clothing above your waist. Stand in front of a mirror in a room with good lighting. Put your hands on your hips. Push your hands firmly downward. Compare your breasts in the mirror. Look for differences between them (asymmetry), such as: Differences in shape. Differences in size. Puckers, dips, and bumps in one breast and not the other. Look at each breast for changes in the skin, such as: Redness. Scaly areas. Look for changes in your nipples, such as: Discharge. Bleeding. Dimpling. Redness. A change in position. Feel for changes Carefully feel your breasts for lumps and changes. It is best to do this while lying on your back on the floor, and again while sitting or standing in the tub or shower with soapy water on your skin. Feel each breast in the following way: Place the arm on the side of the breast you are examining above your head. Feel your breast with the other hand. Start in the nipple area and make -inch (2 cm) overlapping circles to feel your breast. Use the pads of your three middle fingers to do this. Apply light pressure,  then medium pressure, then firm pressure. The light pressure will allow you to feel the tissue closest to the skin. The medium pressure will allow you to feel the tissue that is a little deeper. The firm pressure will allow you to feel the tissue close to the ribs. Continue the overlapping circles, moving downward over the breast until you feel your ribs below your breast. Move one finger-width toward the center of the body. Continue to use the -inch (2 cm) overlapping circles to feel your breast as you move slowly up toward your collarbone. Continue the up-and-down exam using all three pressures until you reach your armpit.  Write down what you find Writing down what you find can help you remember what to discuss with your health care provider. Write down: What is normal for each breast. Any changes that you find in each breast, including: The kind of changes you find. Any pain or tenderness. Size and location of any lumps. Where you are in your menstrual cycle, if you are still menstruating. General tips and recommendations Examine your breasts every month. If you are breastfeeding, the best time to examine your breasts is after a feeding or after using a breast pump. If you menstruate, the best time to examine your breasts is 5-7 days after your period. Breasts are generally lumpier during menstrual periods, and it may be more difficult to notice changes. With time and practice, you will become more familiar with the variations in your breasts and more comfortable with the exam. Contact a health care provider if you:  See a change in the shape or size of your breasts or nipples. See a change in the skin of your breast or nipples, such as a reddened or scaly area. Have unusual discharge from your nipples. Find a lump or thick area that was not there before. Have pain in your breasts. Have any concerns related to your breast health. Summary Breast self-awareness includes looking for  physical changes in your breasts, as well as feeling for any changes within your breasts. Breast self-awareness should be performed in front of a mirror in a well-lit room. You should examine your breasts every month. If you menstruate, the best time to examine your breasts is 5-7 days after your menstrual period. Let your health care provider know of any changes you notice in your breasts, including changes in size, changes on the skin, pain or tenderness, or unusual fluid from your nipples. This information is not intended to replace advice given to you by your health care provider. Make sure you discuss any questions you have with your health care provider. Document Revised: 12/08/2017 Document Reviewed: 12/08/2017 Elsevier Patient Education  2022 [employer] Pap Test Why am I having this test? A Pap test, also called a Pap smear, is a screening test to check for signs of: Infection. Cancer of the cervix. The cervix is the lower part of the uterus that opens into the vagina. Changes that may be a sign that cancer is developing (precancerous changes). Women need this test on a regular basis. In general, you should have a Pap test every 3 years until you reach menopause or age 28. Women aged 30-60 may choose to have their Pap test done at the same time as an HPV (human papillomavirus) test every 5 years (instead of every 3 years). Your health care provider may recommend having Pap tests more or less often depending on your medical conditions and past Pap test results. What is being tested? Cervical cells are tested for signs of infection or abnormalities. What kind of sample is taken? Your health care provider will collect a sample of cells from the surface of your cervix. This will be done using a small cotton swab, plastic spatula, or brush that is inserted into your vagina using a tool called a speculum. This sample is often collected during a pelvic exam, when you are lying on your back on  an exam table with your feet in footrests (stirrups). In some cases, fluids (secretions) from the cervix or vagina may also be collected. How do I prepare for this test? Be aware of where you are in your menstrual cycle. If you are menstruating on the day of the test, you may be asked to reschedule. You may need to reschedule if you have a known vaginal infection on the day of the test. Follow instructions from your health care provider about: Changing or stopping your regular medicines. Some medicines can cause abnormal test results, such as vaginal medicines and tetracycline. Avoiding douching 2-3 days before or the day of the test. Tell a health care provider about: Any allergies you have. All medicines you are taking, including vitamins, herbs, eye drops, creams, and over-the-counter medicines. Any bleeding problems you have. Any surgeries you have had. Any medical conditions you have. Whether you are pregnant or may be pregnant. How are the results reported? Your test results will be reported as either abnormal or normal. What do the results mean? A normal test result means that you do not have signs of cancer  of the cervix. An abnormal result may mean that you have: Cancer. A Pap test by itself is not enough to diagnose cancer. You will have more tests done if cancer is suspected. Precancerous changes in your cervix. Inflammation of the cervix. An STI (sexually transmitted infection). A fungal infection. A parasite infection. Talk with your health care provider about what your results mean. In some cases, your health care provider may do more testing to confirm the results. Questions to ask your health care provider Ask your health care provider, or the department that is doing the test: When will my results be ready? How will I get my results? What are my treatment options? What other tests do I need? What are my next steps? Summary In general, women should have a Pap test  every 3 years until they reach menopause or age 70. Your health care provider will collect a sample of cells from the surface of your cervix. This will be done using a small cotton swab, plastic spatula, or brush. In some cases, fluids (secretions) from the cervix or vagina may also be collected. This information is not intended to replace advice given to you by your health care provider. Make sure you discuss any questions you have with your health care provider. Document Revised: 07/20/2020 Document Reviewed: 07/20/2020 Elsevier Patient Education  2022 [employer].

## 2021-04-15 NOTE — Progress Notes (Signed)
   [facility] Patient [facility] [address] [place], [place]  [address] Phone:  [phone]   Fax:  [phone] Subjective:     Faith [name] is a 30 y.o. female and is here for a comprehensive physical exam. The patient reports no problems.  Social History   Socioeconomic History   Marital status: Single    Spouse name: Not on file   Number of children: Not on file   Years of education: Not on file   Highest education level: Not on file  Occupational History   Not on file  Tobacco Use   Smoking status: Former    Packs/day: 0.25    Types: Cigarettes    Quit date: 04/05/2015    Years since quitting: 6.0   Smokeless tobacco: Never  Vaping Use   Vaping Use: Never used  Substance and Sexual Activity   Alcohol use: Not Currently   Drug use: Never   Sexual activity: Yes    Partners: Male    Birth control/protection: None  Other Topics Concern   Not on file  Social History Narrative   Not on file   Social Determinants of Health   Financial Resource Strain: Not on file  Food Insecurity: Not on file  Transportation Needs: Not on file  Physical Activity: Not on file  Stress: Not on file  Social Connections: Not on file  Intimate Partner Violence: Not on file   Health Maintenance  Topic Date Due   Pneumococcal Vaccine 78-52 Years old (1 - PCV) Never done   PAP SMEAR-Modifier  10/29/2019   COVID-19 Vaccine (3 - Booster for Pfizer series) 09/04/2020   INFLUENZA VACCINE  Never done   TETANUS/TDAP  05/14/2021 (Originally 07/02/2008)   Hepatitis C Screening  Completed   HIV Screening  Completed   HPV VACCINES  Aged Out    The following portions of the patient's history were reviewed and updated as appropriate: allergies, current medications, past family history, past medical history, past social history, past surgical history, and problem list.  Review of Systems Pertinent items are noted in HPI.   Objective:    General appearance: alert, cooperative,  and appears stated age Breasts: normal appearance, no masses or tenderness Pelvic: cervix normal in appearance, external genitalia normal, no adnexal masses or tenderness, no cervical motion tenderness, rectovaginal septum normal, uterus normal size, shape, and consistency, and vagina normal without discharge    Assessment:    Healthy female exam.  1. Gynecologic exam normal - IGP, rfx Aptima HPV ASCU  2. Breast self examination education, encounter for        Plan:     See After Visit Summary for Counseling Recommendations

## 2021-04-16 LAB — IGP, RFX APTIMA HPV ASCU

## 2021-04-24 ENCOUNTER — Encounter: Payer: Self-pay | Admitting: Nurse Practitioner

## 2021-04-24 ENCOUNTER — Other Ambulatory Visit: Payer: Self-pay

## 2021-04-24 ENCOUNTER — Ambulatory Visit (INDEPENDENT_AMBULATORY_CARE_PROVIDER_SITE_OTHER): Payer: 59 | Admitting: Nurse Practitioner

## 2021-04-24 VITALS — BP 143/73 | HR 94 | Temp 97.9°F | Ht 61.0 in | Wt 229.0 lb

## 2021-04-24 DIAGNOSIS — N898 Other specified noninflammatory disorders of vagina: Secondary | ICD-10-CM | POA: Diagnosis not present

## 2021-04-24 DIAGNOSIS — R300 Dysuria: Secondary | ICD-10-CM | POA: Diagnosis not present

## 2021-04-24 DIAGNOSIS — R8290 Unspecified abnormal findings in urine: Secondary | ICD-10-CM

## 2021-04-24 DIAGNOSIS — N3001 Acute cystitis with hematuria: Secondary | ICD-10-CM | POA: Diagnosis not present

## 2021-04-24 LAB — POCT URINALYSIS DIP (CLINITEK)
Bilirubin, UA: NEGATIVE
Glucose, UA: NEGATIVE mg/dL
Ketones, POC UA: NEGATIVE mg/dL
Nitrite, UA: POSITIVE — AB
POC PROTEIN,UA: 100 — AB
Spec Grav, UA: >=1.03 — AB (ref 1.010–1.025)
Urobilinogen, UA: 0.2 E.U./dL
pH, UA: 5.5 (ref 5.0–8.0)

## 2021-04-24 MED ORDER — NITROFURANTOIN MONOHYD MACRO 100 MG PO CAPS
100.0000 mg | ORAL_CAPSULE | Freq: Two times a day (BID) | ORAL | 0 refills | Status: AC
Start: 2021-04-24 — End: 2021-05-01

## 2021-04-24 MED ORDER — METRONIDAZOLE 500 MG PO TABS: 500.0000 mg | ORAL_TABLET | Freq: Two times a day (BID) | ORAL | 0 refills | Status: AC

## 2021-04-24 NOTE — Progress Notes (Signed)
[place], [place]  [address] Phone:  [phone]   Fax:  [phone] Subjective:   Patient ID: Faith [name], female    DOB: 1991/01/22, 30 y.o.   MRN: [id number]  Chief Complaint  Patient presents with   Urinary Tract Infection    Abdominal pain, urgency to urinate, burning sensation while urinating.    HPI Faith [name] 30 y.o. female  has a past medical history of Chlamydia, Hypertension, Infection, Morbid obesity ([place]), Urinary tract infection, and Vaginal Pap smear, abnormal. To the [facility] for vaginal odor and dysuria.  Patient states that she suspects she may have a UTI. Has had increased urinary urgency and frequency and pelvic pain x 2 wks. Pelvic pain most pronounced when laying down at night. States that she noted cloudy urine yesterday.  Also concerned about fishy vaginal odor x 2-3 wks, denies any vaginal discharge, pain or itching. Denies any pain with urination. Patient states that she has had one partner, unprotected in the past 6 mths.  Verbalizes that symptoms began after changing vaginal wash. Attempted to douche with soap and water to treat symptoms, with no improvement.   Denies any other complaints today. Denies any fever. Denies any fatigue, chest pain, shortness of breath, HA or dizziness. Denies any blurred vision, numbness or tingling.  Past Medical History:  Diagnosis Date   Chlamydia    Hypertension    Infection    UTI   Morbid obesity ([place])    Urinary tract infection    Vaginal Pap smear, abnormal    f/u was ok    Past Surgical History:  Procedure Laterality Date   CESAREAN SECTION     COLPOSCOPY     DILATION AND EVACUATION N/A 10/04/2012   Procedure: DILATATION AND EVACUATION;  Surgeon: Guss Bunde, MD;  Location: [facility];  Service: Gynecology;  Laterality: N/A;   LEEP  11/2016    Family History  Problem Relation Age of Onset   Diabetes Sister    Hypertension Paternal  Grandmother    Cancer Paternal Grandfather    Anesthesia problems Neg Hx    Hearing loss Neg Hx     Social History   Socioeconomic History   Marital status: Single    Spouse name: Not on file   Number of children: Not on file   Years of education: Not on file   Highest education level: Not on file  Occupational History   Not on file  Tobacco Use   Smoking status: Former    Packs/day: 0.25    Types: Cigarettes    Quit date: 04/05/2015    Years since quitting: 6.0   Smokeless tobacco: Never  Vaping Use   Vaping Use: Never used  Substance and Sexual Activity   Alcohol use: Not Currently   Drug use: Never   Sexual activity: Yes    Partners: Male    Birth control/protection: None  Other Topics Concern   Not on file  Social History Narrative   Not on file   Social Determinants of Health   Financial Resource Strain: Not on file  Food Insecurity: Not on file  Transportation Needs: Not on file  Physical Activity: Not on file  Stress: Not on file  Social Connections: Not on file  Intimate Partner Violence: Not on file    Outpatient Medications Prior to Visit  Medication Sig Dispense Refill   acetaminophen (TYLENOL) 500 MG tablet Take 500 mg by mouth  every 6 (six) hours as needed for moderate pain or headache.     blood glucose meter kit and supplies Dispense based on patient and insurance preference. Use up to four times daily as directed. (FOR ICD-10 E10.9, E11.9). 1 each 0   ELIQUIS 5 MG TABS tablet TAKE 1 TABLET BY MOUTH TWICE A DAY 60 tablet 2   esomeprazole (NEXIUM) 40 MG capsule Take 1 capsule (40 mg total) by mouth daily at 12 noon. 30 capsule 3   albuterol (VENTOLIN HFA) 108 (90 Base) MCG/ACT inhaler INHALE 2 PUFFS INTO THE LUNGS EVERY SIX HOURS AS NEEDED FOR WHEEZING OR SHORTNESS OF BREATH. 18 g 0   No facility-administered medications prior to visit.    No Known Allergies  Review of Systems  Constitutional:  Negative for chills, fever and malaise/fatigue.   Respiratory:  Negative for cough and shortness of breath.   Cardiovascular:  Negative for chest pain, palpitations and leg swelling.  Gastrointestinal:  Negative for abdominal pain, blood in stool, constipation, diarrhea, nausea and vomiting.  Genitourinary:  Positive for frequency and urgency. Negative for dysuria, flank pain and hematuria.       See HPI  Skin: Negative.   Neurological: Negative.   Psychiatric/Behavioral:  Negative for depression. The patient is not nervous/anxious.   All other systems reviewed and are negative.     Objective:    Physical Exam Constitutional:      General: She is not in acute distress.    Appearance: Normal appearance. She is obese.  HENT:     Head: Normocephalic.  Cardiovascular:     Rate and Rhythm: Normal rate and regular rhythm.     Pulses: Normal pulses.     Heart sounds: Normal heart sounds.     Comments: No obvious peripheral edema Pulmonary:     Effort: Pulmonary effort is normal.     Breath sounds: Normal breath sounds.  Abdominal:     Tenderness: There is no right CVA tenderness or left CVA tenderness.  Genitourinary:    Comments: Exam deferred Skin:    General: Skin is warm and dry.     Capillary Refill: Capillary refill takes less than 2 seconds.  Neurological:     General: No focal deficit present.     Mental Status: She is alert and oriented to person, place, and time.  Psychiatric:        Mood and Affect: Mood normal.        Behavior: Behavior normal.        Thought Content: Thought content normal.        Judgment: Judgment normal.    BP (!) 143/73 (BP Location: Right Arm, Patient Position: Sitting)    Pulse 94    Temp 97.9 F (36.6 C)    Ht _0  (1.549 m)    Wt 229 lb 0.4 oz (103.9 kg)    LMP 04/10/2021 (Exact Date)    SpO2 100%    BMI 43.27 kg/m  Wt Readings from Last 3 Encounters:  04/24/21 229 lb 0.4 oz (103.9 kg)  04/15/21 226 lb 9.6 oz (102.8 kg)  08/15/20 241 lb (109.3 kg)    Immunization History   Administered Date(s) Administered   PFIZER Comirnaty(Gray Top)Covid-19 Tri-Sucrose Vaccine 07/10/2020   PFIZER(Purple Top)SARS-COV-2 Vaccination 06/19/2020    Diabetic Foot Exam - Simple   No data filed     Lab Results  Component Value Date   TSH 1.150 08/15/2020   Lab Results  Component Value Date  WBC 7.9 08/15/2020   HGB 10.9 (L) 08/15/2020   HCT 34.5 08/15/2020   MCV 78 (L) 08/15/2020   PLT 374 08/15/2020   Lab Results  Component Value Date   NA 141 08/15/2020   K 5.1 08/15/2020   CO2 26 07/13/2020   GLUCOSE 82 08/15/2020   BUN 7 08/15/2020   CREATININE 0.70 08/15/2020   BILITOT <0.2 08/15/2020   ALKPHOS 77 08/15/2020   AST 21 08/15/2020   ALT 22 04/30/2020   PROT 7.4 08/15/2020   ALBUMIN 4.5 08/15/2020   CALCIUM 9.8 08/15/2020   ANIONGAP 6 07/13/2020   EGFR 119 08/15/2020   Lab Results  Component Value Date   CHOL 157 08/15/2020   Lab Results  Component Value Date   HDL 43 08/15/2020   Lab Results  Component Value Date   LDLCALC 96 08/15/2020   Lab Results  Component Value Date   TRIG 99 08/15/2020   Lab Results  Component Value Date   CHOLHDL 3.7 08/15/2020   Lab Results  Component Value Date   HGBA1C 5.7 (A) 07/12/2020   HGBA1C 6.4 (H) 04/07/2020       Assessment & Plan:   Problem List Items Addressed This Visit   None Visit Diagnoses     Dysuria    -  Primary   Relevant Medications   nitrofurantoin, macrocrystal-monohydrate, (MACROBID) 100 MG capsule   Other Relevant Orders   POCT URINALYSIS DIP (CLINITEK) (Completed)   Vaginal odor       Relevant Medications   metroNIDAZOLE (FLAGYL) 500 MG tablet   Other Relevant Orders   NuSwab Vaginitis Plus (VG+)          Verbalized history and symptoms consistent with BV, will treat prophylactically while awaiting lab             results            Discussed at length possible causes and s/s of vaginal infection   Abnormal urinalysis       Relevant Orders   Urine Culture   Acute  cystitis with hematuria       Relevant Medications   nitrofurantoin, macrocrystal-monohydrate, (MACROBID) 100 MG capsule   Follow up in 4 mths for wellness visit, sooner as needed    I am having Zuriel R. Triggs start on metroNIDAZOLE and nitrofurantoin (macrocrystal-monohydrate). I am also having her maintain her blood glucose meter kit and supplies, acetaminophen, esomeprazole, Eliquis, and albuterol.  Meds ordered this encounter  Medications   metroNIDAZOLE (FLAGYL) 500 MG tablet    Sig: Take 1 tablet (500 mg total) by mouth 2 (two) times daily for 7 days.    Dispense:  14 tablet    Refill:  0   nitrofurantoin, macrocrystal-monohydrate, (MACROBID) 100 MG capsule    Sig: Take 1 capsule (100 mg total) by mouth 2 (two) times daily for 7 days.    Dispense:  14 capsule    Refill:  0     Teena Dunk, NP

## 2021-04-24 NOTE — Patient Instructions (Addendum)
You were seen today in the [facility] for vaginal symptoms and urinary frequency. Labs were collected, results will be available via MyChart or, if abnormal, you will be contacted by clinic staff. You were prescribed medications, please take as directed. Please follow up in 4 mths for wellness exam.

## 2021-04-28 LAB — NUSWAB VAGINITIS PLUS (VG+)
Atopobium vaginae: HIGH Score — AB
Candida albicans, NAA: NEGATIVE
Candida glabrata, NAA: NEGATIVE
Chlamydia trachomatis, NAA: NEGATIVE
Megasphaera 1: HIGH Score — AB
Neisseria gonorrhoeae, NAA: NEGATIVE
Trich vag by NAA: NEGATIVE

## 2021-04-30 LAB — URINE CULTURE

## 2021-05-29 ENCOUNTER — Encounter (HOSPITAL_BASED_OUTPATIENT_CLINIC_OR_DEPARTMENT_OTHER): Payer: Self-pay | Admitting: Obstetrics and Gynecology

## 2021-05-29 ENCOUNTER — Emergency Department (HOSPITAL_BASED_OUTPATIENT_CLINIC_OR_DEPARTMENT_OTHER)
Admission: EM | Admit: 2021-05-29 | Discharge: 2021-05-29 | Disposition: A | Discharge status: Home / Self Care | Payer: Medicaid Other | Attending: Emergency Medicine | Admitting: Emergency Medicine

## 2021-05-29 ENCOUNTER — Other Ambulatory Visit: Payer: Self-pay

## 2021-05-29 ENCOUNTER — Emergency Department (HOSPITAL_BASED_OUTPATIENT_CLINIC_OR_DEPARTMENT_OTHER): Payer: Medicaid Other | Admitting: Radiology

## 2021-05-29 DIAGNOSIS — Z8616 Personal history of COVID-19: Secondary | ICD-10-CM | POA: Diagnosis not present

## 2021-05-29 DIAGNOSIS — Z20822 Contact with and (suspected) exposure to covid-19: Secondary | ICD-10-CM | POA: Insufficient documentation

## 2021-05-29 DIAGNOSIS — R42 Dizziness and giddiness: Secondary | ICD-10-CM | POA: Insufficient documentation

## 2021-05-29 DIAGNOSIS — F419 Anxiety disorder, unspecified: Secondary | ICD-10-CM | POA: Diagnosis not present

## 2021-05-29 DIAGNOSIS — Z7901 Long term (current) use of anticoagulants: Secondary | ICD-10-CM | POA: Insufficient documentation

## 2021-05-29 DIAGNOSIS — R079 Chest pain, unspecified: Secondary | ICD-10-CM

## 2021-05-29 DIAGNOSIS — N9489 Other specified conditions associated with female genital organs and menstrual cycle: Secondary | ICD-10-CM | POA: Diagnosis not present

## 2021-05-29 DIAGNOSIS — R0789 Other chest pain: Secondary | ICD-10-CM | POA: Diagnosis present

## 2021-05-29 LAB — TROPONIN I (HIGH SENSITIVITY)
Troponin I (High Sensitivity): <2 ng/L (ref <18)
Troponin I (High Sensitivity): <2 ng/L (ref <18)

## 2021-05-29 LAB — BASIC METABOLIC PANEL
Anion gap: 8 (ref 5–15)
BUN: 8 mg/dL (ref 6–20)
CO2: 26 mmol/L (ref 22–32)
Calcium: 9.5 mg/dL (ref 8.9–10.3)
Chloride: 106 mmol/L (ref 98–111)
Creatinine, Ser: 0.71 mg/dL (ref 0.44–1.00)
GFR, Estimated: >60 mL/min (ref >60)
Glucose, Bld: 94 mg/dL (ref 70–99)
Potassium: 4 mmol/L (ref 3.5–5.1)
Sodium: 140 mmol/L (ref 135–145)

## 2021-05-29 LAB — CBC
HCT: 38.2 % (ref 36.0–46.0)
Hemoglobin: 11.6 g/dL — ABNORMAL LOW (ref 12.0–15.0)
MCH: 24.6 pg — ABNORMAL LOW (ref 26.0–34.0)
MCHC: 30.4 g/dL (ref 30.0–36.0)
MCV: 81.1 fL (ref 80.0–100.0)
Platelets: 354 10*3/uL (ref 150–400)
RBC: 4.71 MIL/uL (ref 3.87–5.11)
RDW: 15.3 % (ref 11.5–15.5)
WBC: 7.7 10*3/uL (ref 4.0–10.5)
nRBC: 0 % (ref 0.0–0.2)

## 2021-05-29 LAB — RESP PANEL BY RT-PCR (FLU A&B, COVID) ARPGX2
Influenza A by PCR: NEGATIVE
Influenza B by PCR: NEGATIVE
SARS Coronavirus 2 by RT PCR: NEGATIVE

## 2021-05-29 LAB — HCG, QUANTITATIVE, PREGNANCY: hCG, Beta Chain, Quant, S: <1 m[IU]/mL (ref <5)

## 2021-05-29 LAB — D-DIMER, QUANTITATIVE: D-Dimer, Quant: <0.27 ug/mL-FEU (ref 0.00–0.50)

## 2021-05-29 MED ORDER — ONDANSETRON HCL 4 MG PO TABS: 4.0000 mg | ORAL_TABLET | Freq: Four times a day (QID) | ORAL | 0 refills | Status: AC

## 2021-05-29 MED ORDER — KETOROLAC TROMETHAMINE 30 MG/ML IJ SOLN
30.0000 mg | Freq: Once | INTRAMUSCULAR | Status: AC
  Administered 2021-05-29: 21:00:00 30 mg via INTRAVENOUS
  Filled 2021-05-29: qty 1

## 2021-05-29 NOTE — ED Provider Notes (Signed)
[facility] EMERGENCY DEPT Provider Note   CSN: [id number] Arrival date & time: 05/29/21  1814     History  Chief Complaint  Patient presents with   Chest Pain    Faith [name] is a 31 y.o. female with a past medical history of COVID-pneumonia and associated pulmonary emboli in 2021 presenting today with a complaint of 1-1/2 weeks worth of chest discomfort.  She describes this as pressure.  She states that last time she felt this way she ended up having the COVID-pneumonia.  Also endorsing some lightheadedness.  Admittedly anxious due to the outcome last year.  Denies any increasing shortness of breath from her baseline.  Reports having an occasional cough that is not productive.  She does not have current pain in her chest however she can localize current pain to the right mid back over her rib cage.    Home Medications Prior to Admission medications   Medication Sig Start Date End Date Taking? Authorizing Provider  acetaminophen (TYLENOL) 500 MG tablet Take 500 mg by mouth every 6 (six) hours as needed for moderate pain or headache.    [provider]  albuterol (VENTOLIN HFA) 108 (90 Base) MCG/ACT inhaler INHALE 2 PUFFS INTO THE LUNGS EVERY SIX HOURS AS NEEDED FOR WHEEZING OR SHORTNESS OF BREATH. 04/11/20 04/11/21  Thurnell Lose, MD  blood glucose meter kit and supplies Dispense based on patient and insurance preference. Use up to four times daily as directed. (FOR ICD-10 E10.9, E11.9). 05/14/20   Vevelyn Francois, NP  ELIQUIS 5 MG TABS tablet TAKE 1 TABLET BY MOUTH TWICE A DAY 08/02/20   Freddi Starr, MD  esomeprazole (NEXIUM) 40 MG capsule Take 1 capsule (40 mg total) by mouth daily at 12 noon. 05/29/20   Freada Bergeron, MD  metFORMIN (GLUCOPHAGE) 500 MG tablet Take 1 tablet (500 mg total) by mouth 2 (two) times daily with a meal. Patient not taking: Reported on 07/13/2020 04/11/20 07/13/20  Thurnell Lose, MD      Allergies    Patient has  no known allergies.    Review of Systems   Review of Systems  Constitutional:  Negative for chills and fever.  HENT:  Negative for congestion.   Respiratory:  Positive for cough. Negative for chest tightness.   Cardiovascular:  Positive for chest pain and palpitations.   Physical Exam Updated Vital Signs BP 132/80    Pulse 86    Temp 98.8 F (37.1 C)    Resp 14    Ht _0  (1.549 m)    Wt 104.3 kg    LMP 05/04/2021 (Exact Date)    SpO2 100%    BMI 43.46 kg/m  Physical Exam Vitals and nursing note reviewed.  Constitutional:      General: She is not in acute distress.    Appearance: Normal appearance. She is not ill-appearing.  HENT:     Head: Normocephalic and atraumatic.  Eyes:     General: No scleral icterus.    Conjunctiva/sclera: Conjunctivae normal.  Cardiovascular:     Rate and Rhythm: Normal rate and regular rhythm.     Heart sounds: Normal heart sounds.  Pulmonary:     Effort: Pulmonary effort is normal. No respiratory distress.     Breath sounds: No decreased breath sounds, wheezing, rhonchi or rales.  Chest:     Chest wall: No tenderness.  Abdominal:     Tenderness: There is no abdominal tenderness.  Skin:  General: Skin is warm and dry.     Findings: No rash.  Neurological:     Mental Status: She is alert.  Psychiatric:        Mood and Affect: Mood is anxious.        Behavior: Behavior normal.    ED Results / Procedures / Treatments   Labs (all labs ordered are listed, but only abnormal results are displayed) Labs Reviewed  CBC - Abnormal; Notable for the following components:      Result Value   Hemoglobin 11.6 (*)    MCH 24.6 (*)    All other components within normal limits  RESP PANEL BY RT-PCR (FLU A&B, COVID) ARPGX2  BASIC METABOLIC PANEL  HCG, QUANTITATIVE, PREGNANCY  D-DIMER, QUANTITATIVE  TROPONIN I (HIGH SENSITIVITY)  TROPONIN I (HIGH SENSITIVITY)    EKG EKG Interpretation  Date/Time:  Wednesday May 29 2021 18:21:41  EST Ventricular Rate:  73 PR Interval:  152 QRS Duration: 98 QT Interval:  372 QTC Calculation: 409 R Axis:   70 Text Interpretation: Normal sinus rhythm Normal ECG When compared with ECG of 13-Jul-2020 11:14, No significant change was found Confirmed by Davonna Belling [phone]) on 05/29/2021 6:53:20 PM  Radiology DG Chest 2 View  Result Date: 05/29/2021 CLINICAL DATA:  Chest pain.  Presyncope. EXAM: CHEST - 2 VIEW COMPARISON:  07/12/2020 FINDINGS: The heart size and mediastinal contours are within normal limits. Both lungs are clear. The visualized skeletal structures are unremarkable. IMPRESSION: No active cardiopulmonary disease. Electronically Signed   By: Marlaine Hind M.D.   On: 05/29/2021 18:39    Procedures Procedures    Medications Ordered in ED Medications - No data to display  ED Course/ Medical Decision Making/ A&P                           Medical Decision Making Amount and/or Complexity of Data Reviewed Labs: ordered. Radiology: ordered.  Risk Prescription drug management.   Patient presents to the ED for concern of chest pain, this involves an extensive number of treatment options, and is a complaint that carries with it a high risk of complications and morbidity.  The differential diagnosis includes The emergent differential diagnosis of chest pain includes: Acute coronary syndrome, pericarditis, aortic dissection, pulmonary embolism, tension pneumothorax, and esophageal rupture.  All of these and more were considered throughout the evaluation of the patient.  Co morbidities that complicate the patient evaluation  Obesity and history of chronic shortness of breath after COVID-19 in 2021.  Additional history obtained:  Additional history obtained from in system chart review of patient's hospitalization in 2021 for COVID-19.  Also has a history of tachycardia for which she was followed by cardiology last year.   Lab Tests:  I Ordered, and personally  interpreted labs.  The pertinent results include: Negative D-dimer and negative troponin x2.   Imaging Studies ordered:  I ordered imaging studies including chest x-ray I independently visualized and interpreted the x-ray and agree with the radiologist that there are no abnormalities.  Cardiac Monitoring:  The patient was maintained on a cardiac monitor.  I personally viewed and interpreted the cardiac monitored which showed an underlying rhythm of: Normal sinus rhythm. EKG confirmed NSR by MD Pickering.   Medicines ordered and prescription drug management:  I ordered medication including Toradol for pain management.  She reported that this somewhat helped the pain in her back.    Test Considered:  CT angiogram.  Patient is low risk PE, last clotting was provoked by COVID-19.  D-dimer ordered as stat which was negative.   Problem List / ED Course:  Unspecified Chest pain   Dispostion:  Patient has a low heart score, I do not believe she needs to be sent to cardiology at this time. D-dimer negative, I am not suspicious enough for PE to order a CTA.  Patient has a history of tachycardia however has not been tachycardic in the ED today.  After consideration of the diagnostic results and the patients response to treatment, I feel that the patent would benefit from a follow-up with her primary care provider and a prescription for Zofran for occasional nausea.  She will attempt to use either ibuprofen or naproxen for her pain.  Final Clinical Impression(s) / ED Diagnoses Final diagnoses:  Chest pain, unspecified type    Rx / DC Orders Results and diagnoses were explained to the patient. Return precautions discussed in full. Patient had no additional questions and expressed complete understanding.   This chart was dictated using voice recognition software.  Despite best efforts to proofread,  errors can occur which can change the documentation meaning.      Darliss Ridgel 05/29/21 2155    Davonna Belling, MD 05/30/21 1241

## 2021-05-29 NOTE — Discharge Instructions (Addendum)
Please return with any worsening of your chest pain, shortness of breath, lightheadedness or if you pass out.  It is important for you to be seen by your primary care provider who may refer you to other specialist.  He work-up with [place] today is normal.  I have sent Zofran to your pharmacy for any nausea that pops up over the next few days.  Ibuprofen or naproxen are good options for you the pain in your back.  Heat packs may also loosen up the muscles and make you feel better.

## 2021-05-29 NOTE — ED Triage Notes (Signed)
Patient presents to the ER for complaint of chest pain. Patient reports she keeps feeling like she is going to pass out. Patient reports she had COVID last year and a lung collapse. Patient reports x2 weeks of nausea.

## 2021-09-23 ENCOUNTER — Other Ambulatory Visit: Payer: Self-pay | Admitting: Nurse Practitioner

## 2021-09-23 DIAGNOSIS — R222 Localized swelling, mass and lump, trunk: Secondary | ICD-10-CM

## 2021-10-14 ENCOUNTER — Emergency Department (HOSPITAL_BASED_OUTPATIENT_CLINIC_OR_DEPARTMENT_OTHER): Payer: Medicaid Other | Admitting: Radiology

## 2021-10-14 ENCOUNTER — Emergency Department (HOSPITAL_BASED_OUTPATIENT_CLINIC_OR_DEPARTMENT_OTHER)
Admission: EM | Admit: 2021-10-14 | Discharge: 2021-10-14 | Disposition: A | Discharge status: Home / Self Care | Payer: Medicaid Other | Attending: Emergency Medicine | Admitting: Emergency Medicine

## 2021-10-14 ENCOUNTER — Encounter (HOSPITAL_BASED_OUTPATIENT_CLINIC_OR_DEPARTMENT_OTHER): Payer: Self-pay

## 2021-10-14 ENCOUNTER — Other Ambulatory Visit: Payer: Self-pay

## 2021-10-14 DIAGNOSIS — Z7901 Long term (current) use of anticoagulants: Secondary | ICD-10-CM | POA: Diagnosis not present

## 2021-10-14 DIAGNOSIS — R0789 Other chest pain: Secondary | ICD-10-CM | POA: Insufficient documentation

## 2021-10-14 DIAGNOSIS — R079 Chest pain, unspecified: Secondary | ICD-10-CM | POA: Diagnosis present

## 2021-10-14 DIAGNOSIS — R519 Headache, unspecified: Secondary | ICD-10-CM | POA: Diagnosis not present

## 2021-10-14 HISTORY — DX: Other pulmonary embolism without acute cor pulmonale: I26.99

## 2021-10-14 LAB — CBC
HCT: 36.6 % (ref 36.0–46.0)
Hemoglobin: 11.4 g/dL — ABNORMAL LOW (ref 12.0–15.0)
MCH: 25.3 pg — ABNORMAL LOW (ref 26.0–34.0)
MCHC: 31.1 g/dL (ref 30.0–36.0)
MCV: 81.2 fL (ref 80.0–100.0)
Platelets: 347 10*3/uL (ref 150–400)
RBC: 4.51 MIL/uL (ref 3.87–5.11)
RDW: 14.6 % (ref 11.5–15.5)
WBC: 7 10*3/uL (ref 4.0–10.5)
nRBC: 0 % (ref 0.0–0.2)

## 2021-10-14 LAB — BASIC METABOLIC PANEL
Anion gap: 11 (ref 5–15)
BUN: 8 mg/dL (ref 6–20)
CO2: 25 mmol/L (ref 22–32)
Calcium: 10 mg/dL (ref 8.9–10.3)
Chloride: 103 mmol/L (ref 98–111)
Creatinine, Ser: 0.61 mg/dL (ref 0.44–1.00)
GFR, Estimated: >60 mL/min (ref >60)
Glucose, Bld: 103 mg/dL — ABNORMAL HIGH (ref 70–99)
Potassium: 3.5 mmol/L (ref 3.5–5.1)
Sodium: 139 mmol/L (ref 135–145)

## 2021-10-14 LAB — PREGNANCY, URINE: Preg Test, Ur: NEGATIVE

## 2021-10-14 LAB — D-DIMER, QUANTITATIVE: D-Dimer, Quant: 0.29 ug/mL-FEU (ref 0.00–0.50)

## 2021-10-14 LAB — TROPONIN I (HIGH SENSITIVITY): Troponin I (High Sensitivity): <2 ng/L (ref <18)

## 2021-10-14 NOTE — ED Triage Notes (Signed)
"  Left sided chest pain radiates to left shoulder and upper back, got jittery and light headed so laid down in the floor at work, then my left arm started feeling numb and tingling and I am seeing spots" per pt

## 2021-10-14 NOTE — ED Provider Notes (Signed)
[facility] EMERGENCY DEPT Provider Note   CSN: [id number] Arrival date & time: 10/14/21  1556     History  Chief Complaint  Patient presents with   Chest Pain    Faith [name] is a 31 y.o. female with history of COVID induced PE in 2021 not currently on blood thinners presents to the emergency department for evaluation of left-sided chest pain that began about 1 hour prior to arrival.  Patient states pain is aching and radiates deep into her back.  She has some numbness and tingling of her left upper extremity and her left lower extremity.  She also endorses a headache, feeling "jittery".  She states on her left forearm, she feels a "ache" and she also feels aching on the posterior calf of the lower left extremity.  No treatment prior to arrival.  Patient is not a smoker not currently on OCP.  No previous cardiac history.  No immediate family history of cardiac disease.   Chest Pain      Home Medications Prior to Admission medications   Medication Sig Start Date End Date Taking? Authorizing Provider  acetaminophen (TYLENOL) 500 MG tablet Take 500 mg by mouth every 6 (six) hours as needed for moderate pain or headache.    [provider]  albuterol (VENTOLIN HFA) 108 (90 Base) MCG/ACT inhaler INHALE 2 PUFFS INTO THE LUNGS EVERY SIX HOURS AS NEEDED FOR WHEEZING OR SHORTNESS OF BREATH. 04/11/20 04/11/21  Thurnell Lose, MD  blood glucose meter kit and supplies Dispense based on patient and insurance preference. Use up to four times daily as directed. (FOR ICD-10 E10.9, E11.9). 05/14/20   Vevelyn Francois, NP  ELIQUIS 5 MG TABS tablet TAKE 1 TABLET BY MOUTH TWICE A DAY 08/02/20   Freddi Starr, MD  esomeprazole (NEXIUM) 40 MG capsule Take 1 capsule (40 mg total) by mouth daily at 12 noon. 05/29/20   Freada Bergeron, MD  metFORMIN (GLUCOPHAGE) 500 MG tablet Take 1 tablet (500 mg total) by mouth 2 (two) times daily with a meal. Patient not taking:  Reported on 07/13/2020 04/11/20 07/13/20  Thurnell Lose, MD      Allergies    Patient has no known allergies.    Review of Systems   Review of Systems  Cardiovascular:  Positive for chest pain.    Physical Exam Updated Vital Signs BP (!) 149/83   Pulse 68   Temp 98.7 F (37.1 C) (Oral)   Resp 20   Ht $R'5\' 1"'YX$  (1.549 m)   Wt 99.8 kg   LMP 10/07/2021 (Approximate)   SpO2 100%   BMI 41.57 kg/m  Physical Exam Vitals and nursing note reviewed.  Constitutional:      General: She is not in acute distress.    Appearance: She is not ill-appearing.  HENT:     Head: Atraumatic.  Eyes:     Conjunctiva/sclera: Conjunctivae normal.  Neck:     Vascular: No JVD.  Cardiovascular:     Rate and Rhythm: Normal rate and regular rhythm.     Pulses: Normal pulses.          Radial pulses are 2+ on the right side and 2+ on the left side.       Dorsalis pedis pulses are 2+ on the right side and 2+ on the left side.     Heart sounds: No murmur heard. Pulmonary:     Effort: Pulmonary effort is normal. No respiratory distress.  Breath sounds: Normal breath sounds.  Abdominal:     General: Abdomen is flat. There is no distension.     Palpations: Abdomen is soft.     Tenderness: There is no abdominal tenderness.  Musculoskeletal:        General: Normal range of motion.     Cervical back: Normal range of motion.     Comments: Left posterior calf tenderness  Skin:    General: Skin is warm and dry.     Capillary Refill: Capillary refill takes less than 2 seconds.  Neurological:     General: No focal deficit present.     Mental Status: She is alert.     Comments: Speech is clear, able to follow commands CN III-XII intact Normal strength in upper and lower extremities bilaterally including dorsiflexion and plantar flexion, strong and equal grip strength.  Strong straight leg raise against resistance bilaterally. Subjective sensation equal and intact bilaterally Moves extremities without  ataxia, coordination intact No pronator drift    Psychiatric:        Mood and Affect: Mood normal.     ED Results / Procedures / Treatments   Labs (all labs ordered are listed, but only abnormal results are displayed) Labs Reviewed  BASIC METABOLIC PANEL - Abnormal; Notable for the following components:      Result Value   Glucose, Bld 103 (*)    All other components within normal limits  CBC - Abnormal; Notable for the following components:   Hemoglobin 11.4 (*)    MCH 25.3 (*)    All other components within normal limits  PREGNANCY, URINE  D-DIMER, QUANTITATIVE  TROPONIN I (HIGH SENSITIVITY)  TROPONIN I (HIGH SENSITIVITY)    EKG EKG Interpretation  Date/Time:  Monday October 14 2021 16:09:38 EDT Ventricular Rate:  77 PR Interval:  152 QRS Duration: 100 QT Interval:  384 QTC Calculation: 434 R Axis:   4 Text Interpretation: Normal sinus rhythm Incomplete right bundle branch block Minimal voltage criteria for LVH, may be normal variant ( R in aVL ) Borderline ECG No significant change since last tracing Confirmed by Jacalyn Lefevre [id number]) on 10/14/2021 4:59:10 PM  Radiology DG Chest 2 View  Result Date: 10/14/2021 CLINICAL DATA:  Chest pain and bilateral arm tingling. EXAM: CHEST - 2 VIEW COMPARISON:  Chest two views 05/29/2021 FINDINGS: The heart size and mediastinal contours are within normal limits. Both lungs are clear. The visualized skeletal structures are unremarkable. IMPRESSION: No active cardiopulmonary disease. Electronically Signed   By: Neita Garnet M.D.   On: 10/14/2021 16:39    Procedures Procedures    Medications Ordered in ED Medications - No data to display  ED Course/ Medical Decision Making/ A&P                           Medical Decision Making Amount and/or Complexity of Data Reviewed Labs: ordered. Radiology: ordered.   Social determinants of health:  Social History   Socioeconomic History   Marital status: Single    Spouse name:  Not on file   Number of children: Not on file   Years of education: Not on file   Highest education level: Not on file  Occupational History   Not on file  Tobacco Use   Smoking status: Former    Packs/day: 0.25    Types: Cigarettes    Quit date: 04/05/2015    Years since quitting: 6.5   Smokeless tobacco: Never  Vaping Use   Vaping Use: Never used  Substance and Sexual Activity   Alcohol use: Not Currently   Drug use: Never   Sexual activity: Yes    Partners: Male    Birth control/protection: None  Other Topics Concern   Not on file  Social History Narrative   Not on file   Social Determinants of Health   Financial Resource Strain: Not on file  Food Insecurity: Not on file  Transportation Needs: Not on file  Physical Activity: Not on file  Stress: Not on file  Social Connections: Not on file  Intimate Partner Violence: Not on file     Initial impression:  This patient presents to the ED for concern of left-sided chest pain with left arm numbness, this involves an extensive number of treatment options, and is a complaint that carries with it a high risk of complications and morbidity.   Differentials include ACS, PE, anxiety, musculoskeletal electrolyte abnormality.   Comorbidities affecting care:  Previous history of PE  Additional history obtained: Patient seen in January 2023 with similar complaints, found to be normal  Lab Tests  I Ordered, reviewed, and interpreted labs and EKG.  The pertinent results include:  BMP, CBC normal Troponin normal D-dimer negative Negative pregnancy test  Imaging Studies ordered:  I ordered imaging studies including  Chest x-ray normal I independently visualized and interpreted imaging and I agree with the radiologist interpretation.   EKG: EKG normal  Cardiac Monitoring:  The patient was maintained on a cardiac monitor.  I personally viewed and interpreted the cardiac monitored which showed an underlying rhythm of:  Normal sinus rhythm   ED Course/Re-evaluation: 31 year old female presents to the ED for evaluation of left-sided chest pain with left arm numbness and tingling.  Vitals without significant abnormality.  Lungs and heart sounds normal.  Neuro exam unremarkable.  Labs without acute findings, specifically no electrolyte abnormalities, no ACS and no PE.  EKG was normal.  Chest x-ray reassuring.  Patient's symptoms likely related to underlying anxiety.  It appears that when she experiences chest pain, given her previous history of PE, she presents to the emergency department out of abundance of caution.  Work-up today was very reassuring.  Follow-up with PCP as needed Disposition:  After consideration of the diagnostic results, physical exam, history and the patients response to treatment feel that the patent would benefit from discharge.   Atypical chest pain: Plan and management as described above. Discharged home in good condition.  Final Clinical Impression(s) / ED Diagnoses Final diagnoses:  Atypical chest pain    Rx / DC Orders ED Discharge Orders     None         Rodena Piety 10/14/21 2318    Isla Pence, MD 10/14/21 2329

## 2021-10-14 NOTE — Discharge Instructions (Signed)
Your work-up was very reassuring.  There is no evidence of heart damage or blood clot.  Your electrolytes and chest x-ray are all normal.  It is possible this is related to anxiety.  Please follow-up with your PCP.

## 2021-10-14 NOTE — ED Notes (Signed)
Patient verbalizes understanding of discharge instructions. Opportunity for questioning and answers were provided. Patient discharged from ED.  °

## 2021-10-21 ENCOUNTER — Other Ambulatory Visit: Payer: Medicaid Other

## 2022-01-15 ENCOUNTER — Emergency Department (HOSPITAL_BASED_OUTPATIENT_CLINIC_OR_DEPARTMENT_OTHER)
Admission: EM | Admit: 2022-01-15 | Discharge: 2022-01-15 | Disposition: A | Discharge status: Home / Self Care | Payer: Medicaid Other | Attending: Emergency Medicine | Admitting: Emergency Medicine

## 2022-01-15 ENCOUNTER — Other Ambulatory Visit: Payer: Self-pay

## 2022-01-15 ENCOUNTER — Emergency Department (HOSPITAL_BASED_OUTPATIENT_CLINIC_OR_DEPARTMENT_OTHER): Payer: Medicaid Other | Admitting: Radiology

## 2022-01-15 DIAGNOSIS — Z7901 Long term (current) use of anticoagulants: Secondary | ICD-10-CM | POA: Insufficient documentation

## 2022-01-15 DIAGNOSIS — R0602 Shortness of breath: Secondary | ICD-10-CM | POA: Diagnosis not present

## 2022-01-15 DIAGNOSIS — R202 Paresthesia of skin: Secondary | ICD-10-CM | POA: Insufficient documentation

## 2022-01-15 DIAGNOSIS — I10 Essential (primary) hypertension: Secondary | ICD-10-CM | POA: Insufficient documentation

## 2022-01-15 DIAGNOSIS — Z20822 Contact with and (suspected) exposure to covid-19: Secondary | ICD-10-CM | POA: Insufficient documentation

## 2022-01-15 DIAGNOSIS — R5383 Other fatigue: Secondary | ICD-10-CM | POA: Insufficient documentation

## 2022-01-15 DIAGNOSIS — R079 Chest pain, unspecified: Secondary | ICD-10-CM | POA: Diagnosis present

## 2022-01-15 LAB — BASIC METABOLIC PANEL
Anion gap: 8 (ref 5–15)
BUN: 9 mg/dL (ref 6–20)
CO2: 25 mmol/L (ref 22–32)
Calcium: 9.9 mg/dL (ref 8.9–10.3)
Chloride: 107 mmol/L (ref 98–111)
Creatinine, Ser: 0.72 mg/dL (ref 0.44–1.00)
GFR, Estimated: >60 mL/min (ref >60)
Glucose, Bld: 91 mg/dL (ref 70–99)
Potassium: 3.6 mmol/L (ref 3.5–5.1)
Sodium: 140 mmol/L (ref 135–145)

## 2022-01-15 LAB — CBC
HCT: 34.2 % — ABNORMAL LOW (ref 36.0–46.0)
Hemoglobin: 10.9 g/dL — ABNORMAL LOW (ref 12.0–15.0)
MCH: 25.7 pg — ABNORMAL LOW (ref 26.0–34.0)
MCHC: 31.9 g/dL (ref 30.0–36.0)
MCV: 80.7 fL (ref 80.0–100.0)
Platelets: 353 10*3/uL (ref 150–400)
RBC: 4.24 MIL/uL (ref 3.87–5.11)
RDW: 14.7 % (ref 11.5–15.5)
WBC: 7.7 10*3/uL (ref 4.0–10.5)
nRBC: 0 % (ref 0.0–0.2)

## 2022-01-15 LAB — D-DIMER, QUANTITATIVE: D-Dimer, Quant: <0.27 ug/mL-FEU (ref 0.00–0.50)

## 2022-01-15 LAB — RESP PANEL BY RT-PCR (FLU A&B, COVID) ARPGX2
Influenza A by PCR: NEGATIVE
Influenza B by PCR: NEGATIVE
SARS Coronavirus 2 by RT PCR: NEGATIVE

## 2022-01-15 LAB — TROPONIN I (HIGH SENSITIVITY): Troponin I (High Sensitivity): <2 ng/L (ref <18)

## 2022-01-15 NOTE — ED Notes (Signed)
Pt discharged home after verbalizing understanding of discharge instructions; nad noted. 

## 2022-01-15 NOTE — ED Triage Notes (Addendum)
Reports having chest pains, leg pains,  similar to when she had covid in 2021. Sob, fatigue. cough This episode started this morning.

## 2022-01-15 NOTE — ED Provider Notes (Signed)
[facility] EMERGENCY DEPT Provider Note   CSN: [id number] Arrival date & time: 01/15/22  1859     History  Chief Complaint  Patient presents with   Chest Pain    Faith [name] is a 31 y.o. female.   Chest Pain Patient presents with chest pain.  Anterior chest.  Dull.  Feels somewhat short of breath.  Began earlier today.  No known cardiac history but did have pulmonary embolisms when she had COVID.  No longer on anticoagulation as I thought it was secondary to the [place].  No fevers.  Pain has not been exertional but patient states she is more fatigued.  No known sick contacts.  Reportedly had some tingling in her right leg.  Had done a virtual visit with PCP and told to come to the ER.    Past Medical History:  Diagnosis Date   Chlamydia    Hypertension    Infection    UTI   Morbid obesity ([place])    Pulmonary embolism ([address])    Urinary tract infection    Vaginal Pap smear, abnormal    f/u was ok    Home Medications Prior to Admission medications   Medication Sig Start Date End Date Taking? Authorizing Provider  acetaminophen (TYLENOL) 500 MG tablet Take 500 mg by mouth every 6 (six) hours as needed for moderate pain or headache.    [provider]  albuterol (VENTOLIN HFA) 108 (90 Base) MCG/ACT inhaler INHALE 2 PUFFS INTO THE LUNGS EVERY SIX HOURS AS NEEDED FOR WHEEZING OR SHORTNESS OF BREATH. 04/11/20 04/11/21  Thurnell Lose, MD  blood glucose meter kit and supplies Dispense based on patient and insurance preference. Use up to four times daily as directed. (FOR ICD-10 E10.9, E11.9). 05/14/20   Vevelyn Francois, NP  ELIQUIS 5 MG TABS tablet TAKE 1 TABLET BY MOUTH TWICE A DAY 08/02/20   Freddi Starr, MD  esomeprazole (NEXIUM) 40 MG capsule Take 1 capsule (40 mg total) by mouth daily at 12 noon. 05/29/20   Freada Bergeron, MD  metFORMIN (GLUCOPHAGE) 500 MG tablet Take 1 tablet (500 mg total) by mouth 2 (two) times daily with a  meal. Patient not taking: Reported on 07/13/2020 04/11/20 07/13/20  Thurnell Lose, MD      Allergies    Patient has no known allergies.    Review of Systems   Review of Systems  Cardiovascular:  Positive for chest pain.    Physical Exam Updated Vital Signs BP 135/76 (BP Location: Right Arm)   Pulse 65   Temp 98.3 F (36.8 C) (Oral)   Resp 12   LMP 01/10/2022 (Exact Date)   SpO2 97%  Physical Exam Vitals reviewed.  Constitutional:      Appearance: She is obese.  Cardiovascular:     Rate and Rhythm: Normal rate and regular rhythm.  Pulmonary:     Effort: No tachypnea.     Breath sounds: No decreased breath sounds or wheezing.  Chest:     Chest wall: No tenderness.  Musculoskeletal:     Right lower leg: No edema.     Left lower leg: No edema.  Skin:    General: Skin is warm.     Capillary Refill: Capillary refill takes less than 2 seconds.  Neurological:     Mental Status: She is alert.     ED Results / Procedures / Treatments   Labs (all labs ordered are listed, but only abnormal results are displayed)  Labs Reviewed  CBC - Abnormal; Notable for the following components:      Result Value   Hemoglobin 10.9 (*)    HCT 34.2 (*)    MCH 25.7 (*)    All other components within normal limits  RESP PANEL BY RT-PCR (FLU A&B, COVID) [id number]  BASIC METABOLIC PANEL  D-DIMER, QUANTITATIVE  PREGNANCY, URINE  TROPONIN I (HIGH SENSITIVITY)    EKG EKG Interpretation  Date/Time:  Wednesday January 15 2022 19:11:44 EDT Ventricular Rate:  81 PR Interval:  158 QRS Duration: 98 QT Interval:  366 QTC Calculation: 425 R Axis:   32 Text Interpretation: Sinus rhythm ST elev, probable normal early repol pattern No significant change since last tracing Confirmed by Davonna Belling [phone]) on 01/15/2022 8:04:03 PM  Radiology DG Chest 2 View  Result Date: 01/15/2022 CLINICAL DATA:  Chest pain EXAM: CHEST - 2 VIEW COMPARISON:  10/14/2021 FINDINGS: The heart size and  mediastinal contours are within normal limits. Both lungs are clear. The visualized skeletal structures are unremarkable. IMPRESSION: No active cardiopulmonary disease. Electronically Signed   By: Donavan Foil M.D.   On: 01/15/2022 19:42    Procedures Procedures    Medications Ordered in ED Medications - No data to display  ED Course/ Medical Decision Making/ A&P                           Medical Decision Making Amount and/or Complexity of Data Reviewed Labs: ordered. Radiology: ordered.   Patient with chest pain.  Anterior.  Dull.  Also tingling in the right leg.  Has had a little bit of a cough.  Some shortness of breath and fatigue.  Negative COVID test.  Negative D-dimer and negative troponin.  Doubt cardiac ischemia.  Doubt pulmonary embolism.  Chest x-ray reassuring without clear cause of the pain.  Doubt DVT at this time.  Appears stable for discharge home with symptomatic treatment.         Final Clinical Impression(s) / ED Diagnoses Final diagnoses:  Chest pain, unspecified type    Rx / DC Orders ED Discharge Orders     None         Davonna Belling, MD 01/15/22 2018

## 2022-03-07 ENCOUNTER — Ambulatory Visit: Payer: Medicaid Other | Admitting: Radiology

## 2022-03-07 ENCOUNTER — Encounter: Payer: Self-pay | Admitting: Radiology

## 2022-03-07 VITALS — BP 114/76 | Ht 61.0 in | Wt 225.0 lb

## 2022-03-07 DIAGNOSIS — N926 Irregular menstruation, unspecified: Secondary | ICD-10-CM

## 2022-03-07 DIAGNOSIS — R5383 Other fatigue: Secondary | ICD-10-CM

## 2022-03-07 DIAGNOSIS — N920 Excessive and frequent menstruation with regular cycle: Secondary | ICD-10-CM

## 2022-03-07 LAB — PREGNANCY, URINE: Preg Test, Ur: NEGATIVE

## 2022-03-07 NOTE — Progress Notes (Signed)
Faythe Heitzenrater [facility] 1990-06-03 [id number]   History:  31 y.o. G4P3 present with complaint of irregular periods x's 6 months, increased fatigue.  Bleeding starts and stops, flow varies heavy to light. Cycles are 20-22 days apart. Trying for pregnancy x's 1 1/2 years.  Hx of CIN3 s/p LEEP 2018. Pulmonary embolism after having COVID pneumonia, no longer on eloquis x1 year.   Gynecologic History Patient's last menstrual period was 01/03/2022 (approximate). Period Pattern: Regular Menstrual Flow: Heavy (flow varies heavy to light) Menstrual Control: Maxi pad Dysmenorrhea: (!) Mild Dysmenorrhea Symptoms: Cramping Contraception/Family planning: none Sexually active: yes Last Pap: 2022. Results were: normal, HSIL/CIN 3 hx   Obstetric History OB History  Gravida Para Term Preterm AB Living  4 3 3  0 1 3  SAB IAB Ectopic Multiple Live Births  0 1 0 0 3    # Outcome Date GA Lbr Len/2nd Weight Sex Delivery Anes PTL Lv  4 IAB 03/12/16     TAB     3 Term 08/07/13 [redacted]w[redacted]d 08:46 / 00:09 8 lb 1.5 oz (3.671 kg) M VBAC EPI  LIV  2 Term 01/21/08    F Vag-Spont   LIV     Birth Comments: no care- did not know she was pregnant  1 Term 12/21/06    F CS-LTranv  N LIV     Birth Comments: BP     The following portions of the patient's history were reviewed and updated as appropriate: allergies, current medications, past family history, past medical history, past social history, past surgical history, and problem list.  Review of Systems Pertinent items noted in HPI and remainder of comprehensive ROS otherwise negative.   Past medical history, past surgical history, family history and social history were all reviewed and documented in the EPIC chart.   Exam:  Vitals:   03/07/22 1125  BP: 114/76  Weight: 225 lb (102.1 kg)  Height: 5\' 1"  (1.549 m)   Body mass index is 42.51 kg/m.  General appearance:  Normal, obese Respiratory  Auscultation:  Clear without wheezing or  rhonchi Cardiovascular  Auscultation:  Regular rate, without rubs, murmurs or gallops  Edema/varicosities:  Not grossly evident Abdominal  Soft,nontender, without masses, guarding or rebound.  Liver/spleen:  No organomegaly noted  Hernia:  None appreciated  Skin  Inspection:  Grossly normal Genitourinary   Inguinal/mons:  Normal without inguinal adenopathy  External genitalia:  Normal appearing vulva with no masses, tenderness, or lesions  BUS/Urethra/Skene's glands:  Normal without masses or exudate  Vagina:  Normal appearing with normal color and discharge, no lesions  Cervix:  Normal appearing without discharge or lesions  Uterus:  Normal in size, shape and contour.  Mobile, nontender  Adnexa/parametria:     Rt: Normal in size, without masses or tenderness.   Lt: Normal in size, without masses or tenderness.  Anus and perineum: Normal   Patient informed chaperone available to be present for breast and pelvic exam. Patient has requested no chaperone to be present. Patient has been advised what will be completed during breast and pelvic exam.   Assessment/Plan:   1. Irregular periods - Pregnancy, urine;negative  2. Other fatigue - CBC - Ferritin - Thyroid Panel With TSH  3. Irregular menses - HgB A1c  4. Polymenorrhea - 13/03/23 Transvaginal Non-OB; Future   Will contact with results of lab testing completed today and mange accordingly Explained we will meet after u/s to go over the results and plan. Pt agreeable AEX/PAP due 12/23  Rubbie Battiest B WHNP-BC 11:48 AM 03/07/2022

## 2022-03-08 LAB — FERRITIN: Ferritin: 14 ng/mL — ABNORMAL LOW (ref 16–154)

## 2022-03-08 LAB — CBC
HCT: 37.6 % (ref 35.0–45.0)
Hemoglobin: 12.1 g/dL (ref 11.7–15.5)
MCH: 25.9 pg — ABNORMAL LOW (ref 27.0–33.0)
MCHC: 32.2 g/dL (ref 32.0–36.0)
MCV: 80.5 fL (ref 80.0–100.0)
MPV: 10.2 fL (ref 7.5–12.5)
Platelets: 367 10*3/uL (ref 140–400)
RBC: 4.67 10*6/uL (ref 3.80–5.10)
RDW: 13.9 % (ref 11.0–15.0)
WBC: 7.6 10*3/uL (ref 3.8–10.8)

## 2022-03-08 LAB — THYROID PANEL WITH TSH
Free Thyroxine Index: 2.2 (ref 1.4–3.8)
T3 Uptake: 29 % (ref 22–35)
T4, Total: 7.5 ug/dL (ref 5.1–11.9)
TSH: 1.42 mIU/L

## 2022-03-08 LAB — HEMOGLOBIN A1C
Hgb A1c MFr Bld: 6 % of total Hgb — ABNORMAL HIGH (ref <5.7)
Mean Plasma Glucose: 126 mg/dL
eAG (mmol/L): 7 mmol/L

## 2022-04-01 ENCOUNTER — Ambulatory Visit: Payer: Medicaid Other

## 2022-04-01 ENCOUNTER — Ambulatory Visit (INDEPENDENT_AMBULATORY_CARE_PROVIDER_SITE_OTHER): Payer: Medicaid Other | Admitting: Radiology

## 2022-04-01 ENCOUNTER — Encounter: Payer: Self-pay | Admitting: Radiology

## 2022-04-01 VITALS — BP 120/82

## 2022-04-01 DIAGNOSIS — N920 Excessive and frequent menstruation with regular cycle: Secondary | ICD-10-CM

## 2022-04-01 DIAGNOSIS — R7303 Prediabetes: Secondary | ICD-10-CM | POA: Diagnosis not present

## 2022-04-01 DIAGNOSIS — D500 Iron deficiency anemia secondary to blood loss (chronic): Secondary | ICD-10-CM

## 2022-04-01 MED ORDER — SLYND 4 MG PO TABS: 1.0000 | ORAL_TABLET | Freq: Every day | ORAL | 0 refills | Status: DC

## 2022-04-01 NOTE — Progress Notes (Signed)
   Faith [name] [facility] [birth year] [id number]   History:  31 y.o. G4P3 presents for follow up u/s for irregular periods x's 7 months, increased fatigue.  Bleeding starts and stops, flow varies heavy to light. Cycles are 20-22 days apart. Trying for pregnancy x's 1 1/2 years.  Hx of CIN3 s/p LEEP 2018. Pulmonary embolism after having COVID pneumonia, no longer on eloquis x1 year.   Gynecologic History Patient's last menstrual period was 01/03/2022 (approximate).   Contraception/Family planning: none Sexually active: yes Last Pap: 2022. Results were: normal, HSIL/CIN 3 hx   Obstetric History OB History  Gravida Para Term Preterm AB Living  4 3 3  0 1 3  SAB IAB Ectopic Multiple Live Births  0 1 0 0 3    # Outcome Date GA Lbr Len/2nd Weight Sex Delivery Anes PTL Lv  4 IAB 03/12/16     TAB     3 Term 08/07/13 [redacted]w[redacted]d 08:46 / 00:09 8 lb 1.5 oz (3.671 kg) M VBAC EPI  LIV  2 Term 01/21/08    F Vag-Spont   LIV     Birth Comments: no care- did not know she was pregnant  1 Term 12/21/06    F CS-LTranv  N LIV     Birth Comments: BP     The following portions of the patient's history were reviewed and updated as appropriate: allergies, current medications, past family history, past medical history, past social history, past surgical history, and problem list.  Review of Systems Pertinent items noted in HPI and remainder of comprehensive ROS otherwise negative.   Past medical history, past surgical history, family history and social history were all reviewed and documented in the EPIC chart.   Exam:  Vitals:   04/01/22 1353  BP: 120/82   There is no height or weight on file to calculate BMI.  Indication: polymenorrhea  Vaginal u/s: Uterus normal size and shape, retroflexed No myometrial masses Symmetrical endometrium No masses or thickening seen  Both ovaries mobile, normal size and normal follicle pattern  No adnexal masses No free fluid  Assessment/Plan:   1.  Polymenorrhea Reassured normal u/s Will start Slynd for 1 month to decrease lining and reset cycles  2. Iron deficiency anemia due to chronic blood loss Continue BID iron, take with vitamin C  3. Prediabetes Weight loss and exercise/health eating discussed   Follow up 2 months for AEX, will recheck ferritin at that visit  Giovoni Bunch B WHNP-BC 2:01 PM 04/01/2022

## 2022-04-29 ENCOUNTER — Other Ambulatory Visit: Payer: Self-pay | Admitting: Radiology

## 2022-04-29 DIAGNOSIS — N920 Excessive and frequent menstruation with regular cycle: Secondary | ICD-10-CM

## 2022-04-30 NOTE — Telephone Encounter (Signed)
Please advise. What patient only taking for 30 days?

## 2022-05-27 ENCOUNTER — Ambulatory Visit: Payer: Medicaid Other | Admitting: Radiology

## 2022-05-28 ENCOUNTER — Encounter (HOSPITAL_BASED_OUTPATIENT_CLINIC_OR_DEPARTMENT_OTHER): Payer: Self-pay

## 2022-05-28 ENCOUNTER — Emergency Department (HOSPITAL_BASED_OUTPATIENT_CLINIC_OR_DEPARTMENT_OTHER)
Admission: EM | Admit: 2022-05-28 | Discharge: 2022-05-28 | Disposition: A | Discharge status: Home / Self Care | Payer: Medicaid Other | Attending: Emergency Medicine | Admitting: Emergency Medicine

## 2022-05-28 ENCOUNTER — Other Ambulatory Visit: Payer: Self-pay

## 2022-05-28 ENCOUNTER — Emergency Department (HOSPITAL_BASED_OUTPATIENT_CLINIC_OR_DEPARTMENT_OTHER): Payer: Medicaid Other | Admitting: Radiology

## 2022-05-28 DIAGNOSIS — Z8616 Personal history of COVID-19: Secondary | ICD-10-CM | POA: Insufficient documentation

## 2022-05-28 DIAGNOSIS — J101 Influenza due to other identified influenza virus with other respiratory manifestations: Secondary | ICD-10-CM | POA: Diagnosis not present

## 2022-05-28 DIAGNOSIS — Z20822 Contact with and (suspected) exposure to covid-19: Secondary | ICD-10-CM | POA: Insufficient documentation

## 2022-05-28 DIAGNOSIS — R059 Cough, unspecified: Secondary | ICD-10-CM | POA: Diagnosis present

## 2022-05-28 LAB — RESP PANEL BY RT-PCR (RSV, FLU A&B, COVID)  RVPGX2
Influenza A by PCR: NEGATIVE
Influenza B by PCR: POSITIVE — AB
Resp Syncytial Virus by PCR: NEGATIVE
SARS Coronavirus 2 by RT PCR: NEGATIVE

## 2022-05-28 LAB — PREGNANCY, URINE: Preg Test, Ur: NEGATIVE

## 2022-05-28 MED ORDER — OSELTAMIVIR PHOSPHATE 75 MG PO CAPS: 75.0000 mg | ORAL_CAPSULE | Freq: Two times a day (BID) | ORAL | 0 refills | Status: DC

## 2022-05-28 NOTE — ED Provider Notes (Signed)
[facility] Provider Note   CSN: [id number] Arrival date & time: 05/28/22  2000     History  Chief Complaint  Patient presents with   Cough    Faith [name] is a 32 y.o. female.  Patient is a 31 year old female with past medical history of pulmonary embolism and spontaneous pneumothorax related to COVID-19 infection in 2021.  Patient presenting today with complaints of cough, shortness of breath, fever, chills, body aches that have been worsening over the past 3 days.  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  She denies any alleviating or aggravating factors.  She has been trying ibuprofen and over-the-counter medications with little relief.  Cough has been nonproductive.  Denies to me she is having any chest pain.  The history is provided by the patient.       Home Medications Prior to Admission medications   Medication Sig Start Date End Date Taking? Authorizing Provider  acetaminophen (TYLENOL) 500 MG tablet Take 500 mg by mouth every 6 (six) hours as needed for moderate pain or headache.    [provider]  SLYND 4 MG TABS TAKE ONE (1) TABLET BY MOUTH DAILY. 04/30/22   Chrzanowski, Wende Crease B, NP  metFORMIN (GLUCOPHAGE) 500 MG tablet Take 1 tablet (500 mg total) by mouth 2 (two) times daily with a meal. Patient not taking: Reported on 07/13/2020 04/11/20 07/13/20  Thurnell Lose, MD      Allergies    Patient has no known allergies.    Review of Systems   Review of Systems  All other systems reviewed and are negative.   Physical Exam Updated Vital Signs BP (!) 139/108 (BP Location: Right Arm)   Pulse (!) 112   Temp 99.6 F (37.6 C)   Resp (!) 24   Ht 5\' 1"  (1.549 m)   Wt 104.3 kg   LMP 05/12/2022 (Exact Date)   SpO2 99%   BMI 43.46 kg/m  Physical Exam Vitals and nursing note reviewed.  Constitutional:      General: She is not in acute distress.    Appearance: She is well-developed. She  is not diaphoretic.  HENT:     Head: Normocephalic and atraumatic.  Cardiovascular:     Rate and Rhythm: Normal rate and regular rhythm.     Heart sounds: No murmur heard.    No friction rub. No gallop.  Pulmonary:     Effort: Pulmonary effort is normal. No respiratory distress.     Breath sounds: Normal breath sounds. No wheezing.  Abdominal:     General: Bowel sounds are normal. There is no distension.     Palpations: Abdomen is soft.     Tenderness: There is no abdominal tenderness.  Musculoskeletal:        General: No swelling or tenderness. Normal range of motion.     Cervical back: Normal range of motion and neck supple.     Right lower leg: No edema.     Left lower leg: No edema.  Skin:    General: Skin is warm and dry.  Neurological:     General: No focal deficit present.     Mental Status: She is alert and oriented to person, place, and time.     ED Results / Procedures / Treatments   Labs (all labs ordered are listed, but only abnormal results are displayed) Labs Reviewed  RESP PANEL BY RT-PCR (RSV, FLU A&B, COVID)  RVPGX2 - Abnormal;  Notable for the following components:      Result Value   Influenza B by PCR POSITIVE (*)    All other components within normal limits  PREGNANCY, URINE    EKG None  Radiology DG Chest 2 View  Result Date: 05/28/2022 CLINICAL DATA:  Cough EXAM: CHEST - 2 VIEW COMPARISON:  Chest x-ray 01/15/2022 FINDINGS: The heart size and mediastinal contours are within normal limits. Both lungs are clear. The visualized skeletal structures are unremarkable. IMPRESSION: No active cardiopulmonary disease. Electronically Signed   By: Ronney Asters M.D.   On: 05/28/2022 20:39    Procedures Procedures    Medications Ordered in ED Medications - No data to display  ED Course/ Medical Decision Making/ A&P  Patient presenting today with complaints of URI symptoms as described in the HPI.  She arrives here with stable vital signs.  There is no  hypoxia and no fever.  Chest x-ray shows no acute process.  Nasal swab does return positive for influenza B.  Patient to be given Tamiflu and to continue over-the-counter medications.  To return as needed for any problems.  Final Clinical Impression(s) / ED Diagnoses Final diagnoses:  None    Rx / DC Orders ED Discharge Orders     None         Veryl Speak, MD 05/28/22 2322

## 2022-05-28 NOTE — Discharge Instructions (Signed)
Begin taking Tamiflu as prescribed.  Continue over-the-counter medications as needed for symptom relief.  Drink plenty of fluids and get plenty of rest.

## 2022-05-28 NOTE — ED Notes (Signed)
Pt verbalized understanding of d/c instructions, meds, and followup care. Denies questions. VSS, no distress noted. Steady gait to exit with all belongings.  ?

## 2022-05-28 NOTE — ED Triage Notes (Addendum)
Pt reports, fever, chills, coughing and SHOB x 3 days +nausea Using several OTC meds with minimal relief

## 2022-05-30 ENCOUNTER — Telehealth: Payer: Self-pay

## 2022-05-30 NOTE — Telephone Encounter (Signed)
Transition Care Management Unsuccessful Follow-up Telephone Call  Date of discharge and from where:  05/28/22  Attempts:  1st Attempt  Reason for unsuccessful TCM follow-up call:  Left voice message  Jagdeep Ancheta RMA     

## 2022-06-19 ENCOUNTER — Ambulatory Visit: Payer: Medicaid Other | Admitting: Radiology

## 2022-06-26 ENCOUNTER — Ambulatory Visit
Admission: RE | Admit: 2022-06-26 | Discharge: 2022-06-26 | Disposition: A | Discharge status: Home / Self Care | Payer: Medicaid Other | Source: Ambulatory Visit | Attending: Physician Assistant | Admitting: Physician Assistant

## 2022-06-26 ENCOUNTER — Other Ambulatory Visit: Payer: Self-pay | Admitting: Physician Assistant

## 2022-06-26 DIAGNOSIS — M545 Low back pain, unspecified: Secondary | ICD-10-CM

## 2022-08-21 ENCOUNTER — Ambulatory Visit: Payer: Medicaid Other

## 2022-09-15 ENCOUNTER — Other Ambulatory Visit: Payer: Self-pay

## 2022-09-15 ENCOUNTER — Ambulatory Visit: Payer: Medicaid Other | Attending: Orthopaedic Surgery | Admitting: Physical Therapy

## 2022-09-15 ENCOUNTER — Encounter: Payer: Self-pay | Admitting: Physical Therapy

## 2022-09-15 DIAGNOSIS — R293 Abnormal posture: Secondary | ICD-10-CM

## 2022-09-15 DIAGNOSIS — M62830 Muscle spasm of back: Secondary | ICD-10-CM | POA: Diagnosis present

## 2022-09-15 DIAGNOSIS — M5459 Other low back pain: Secondary | ICD-10-CM | POA: Diagnosis present

## 2022-09-15 NOTE — Therapy (Signed)
OUTPATIENT PHYSICAL THERAPY THORACOLUMBAR EVALUATION   Patient Name: Faith [name] MRN: [id number] DOB:1990/06/20, 32 y.o., female Today's Date: 09/15/2022  END OF SESSION:  PT End of Session - 09/15/22 1023     Visit Number 1    Number of Visits 9    Date for PT Re-Evaluation 11/10/22    Authorization Type [employer]    PT Start Time 1019    PT Stop Time 1108    PT Time Calculation (min) 49 min    Activity Tolerance Patient tolerated treatment well    Behavior During Therapy WFL for tasks assessed/performed             Past Medical History:  Diagnosis Date   Chlamydia    Hypertension    Infection    UTI   Morbid obesity (HCC)    Pulmonary embolism (HCC)    Urinary tract infection    Vaginal Pap smear, abnormal    f/u was ok   Past Surgical History:  Procedure Laterality Date   CESAREAN SECTION     COLPOSCOPY     DILATION AND EVACUATION N/A 10/04/2012   Procedure: DILATATION AND EVACUATION;  Surgeon: Lesly Dukes, MD;  Location: [facility];  Service: Gynecology;  Laterality: N/A;   LEEP  11/2016   Patient Active Problem List   Diagnosis Date Noted   Chest pain 08/16/2020   Pneumonia due to COVID-19 virus 04/23/2020   History of COVID-19 04/23/2020   Tachycardia 04/23/2020   COVID-19 virus infection 04/02/2020   Pulmonary embolism (HCC) 04/02/2020   Morbid obesity (HCC)    Abnormal Pap smear of vagina 01/31/2016    PCP: Barbette Merino, NP   REFERRING PROVIDER: Low back pain  REFERRING DIAG: Marcene Corning, MD   Rationale for Evaluation and Treatment: Rehabilitation  THERAPY DIAG:  Other low back pain  Muscle spasm of back  Abnormal posture  ONSET DATE: October 2023  SUBJECTIVE:                                                                                                                                                                                           SUBJECTIVE STATEMENT: Pt reports low back pain that started in  October of 2023. She is unsure of what may of cause it could be combination factors with work and COVID. Other than that she is unsure what could be effecting. Currently the trigger is standing up for 3-5 min, she reports referring symptoms into the L buttock. Laying prone makes it worse, and sitting up straight makes it worse. She denies any back pain prior.   PERTINENT HISTORY:  Obesity  PAIN:  Are you having pain? Yes: NPRS scale: 5/10, At worst 7/10, at best 1/10 Pain location: cramp, heavy tightness, constant   Pain description: low back with referred to the L/R Aggravating factors: prolonged standing, prolong walking, sitting up straight Relieving factors: muscle relaxer, ibuprofen, heating  PRECAUTIONS: None  WEIGHT BEARING RESTRICTIONS: No  FALLS:  Has patient fallen in last 6 months? 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   OBJECTIVE:   DIAGNOSTIC FINDINGS:  X-ray 06/26/22 Lumbar spine  IMPRESSION: Negative.  PATIENT SURVEYS:  FOTO 40% and predicted 54%  SCREENING FOR RED FLAGS: Bowel or bladder incontinence: No Cauda equina syndrome: No  COGNITION: Overall cognitive status: Within functional limits for tasks assessed     SENSATION: WFL    POSTURE: rounded shoulders and forward head  PALPATION: TTP along bil lumbar paraspinals and QL with L>R, and pain with palpation at the L SIJ/ fortins area.  LUMBAR ROM:   AROM eval  Flexion 70 ERP  Extension 20 ERP  Right lateral flexion 20  Left lateral flexion 20  Right rotation   Left rotation    (Blank rows = not tested)  LOWER EXTREMITY ROM:     Active  Right eval  Left eval  Hip flexion    Hip extension    Hip abduction    Hip adduction    Hip internal rotation    Hip external rotation    Knee flexion    Knee extension    Ankle dorsiflexion    Ankle plantarflexion    Ankle inversion    Ankle eversion     (Blank rows = not tested)  LOWER EXTREMITY MMT:    MMT Right eval Left eval  Hip flexion 4+ 4+  Hip extension 4+ 4+  Hip abduction 4+ 4 *  Hip adduction 4+ 4+  Hip internal rotation    Hip external rotation    Knee flexion 5 4+ *  Knee extension 5 4+  Ankle dorsiflexion    Ankle plantarflexion    Ankle inversion    Ankle eversion     (Blank rows = not tested) (* = concordant pain)  LUMBAR SPECIAL TESTS:  Quadrant test: Positive with trunk lean with ext/ L sidebend, SI Compression/distraction test: Postive, and forward flexion assessment reducted L SIJ superior movement  GAIT: Distance walked: 80 ft to treatment room Assistive device utilized: None Level of assistance: Complete Independence Comments: unremarkable.   TODAY'S TREATMENT:  [facility] Adult PT Treatment:                                                DATE: 09/15/22 Therapeutic Exercise: Hamstring stretch PNF contract / relax with 10 sec contraction SLR L 2 x 12 LTR 1 x 10 Manual Therapy: MTPR along L QL and lumbar paraspinals x 2   PATIENT EDUCATION:  Education details: evaluation findings, POC, goals, HEP with proper form.  Person educated: Patient Education method: Explanation, Verbal cues, and Handouts Education comprehension: verbalized understanding  HOME EXERCISE PROGRAM: Access Code: [email or web address] URL: [email or web address] Date: 09/15/2022 Prepared by: Lulu Riding  Exercises - Supine Posterior Pelvic Tilt  - 1 x daily - 7 x weekly - 2 sets - 10 reps - Lower Trunk Rotation  - 1 x daily - 7 x weekly - 2 sets - 10  reps - Seated Flexion Stretch  - 3 x daily - 7 x weekly - 1-2 sets - 1-2 reps - 30 - 60 sec hold - Child's Pose Stretch  - 3 x daily - 7 x weekly - 1-2 sets - 1-2 reps - 30- 60 sec hold - Seated Hamstring Stretch  - 3 x daily - 7 x weekly - 2 sets - 2 reps - 30 hold - Supine Hamstring Stretch with Strap  - 1 x daily - 7 x weekly - 2 sets - 2 reps - 30 hold - SLR  - 1 x daily - 7 x weekly - 3 sets - 10-12 reps - 1 hold  ASSESSMENT:  CLINICAL IMPRESSION: Patient is a 32 y.o. F who was seen today for physical therapy evaluation and treatment for Dx of low back pain. She has functional trunk mobility with reproduction of pain noted at end ranges, and mild weakness in LLE hamstrings/ abductors secondary to reproduction of symptoms in the low back. Testing suggest high liklihood of SIJ component with anterior deficit. She responded well in session to hamstring stretching, LLE SLR / hip flexor MET, and MTPR along the L lumbar paraspinals and reported pain dropped from a 5/10 to 0. She would benefit from physical therapy to decrease low back pain, reduce muscle spasm/ tension, promote efficient posture, and maximize her function by addressing the deficits listed.   OBJECTIVE IMPAIRMENTS: decreased activity tolerance, decreased endurance, decreased ROM, improper body mechanics, postural dysfunction, obesity, and pain.   ACTIVITY LIMITATIONS: standing, squatting, stairs, and locomotion level  PARTICIPATION LIMITATIONS: shopping, community activity, occupation, and yard work  PERSONAL FACTORS: Time since onset of injury/illness/exacerbation are also affecting patient's functional outcome.   REHAB POTENTIAL: Good  CLINICAL DECISION MAKING: Stable/uncomplicated  EVALUATION COMPLEXITY: Low   GOALS: Goals reviewed with patient? Yes  SHORT TERM GOALS: Target date: 10/13/2022   Pt to be IND with initial HEP for therapeutic progression Baseline: no previous HEP Goal status: INITIAL  2.  Pt to be  able to verbalize/ demo efficient posture and lifting mechanics to reduce and prevent low back pain Baseline:  limited knowledge of posture Goal status: INITIAL  LONG TERM GOALS: Target date: 11/10/2022   Maintain functional trunk mobility reporting </= 2/10 max pain at end ranges for improvement in function. Baseline:  max pain is 7/10 Goal status: INITIAL  2.  Pt to be able to stand/ walk for >/= 1 hour for endurance required for ADLs and work related tasks  with </= 2/10 pain Baseline: pain started with standing for 3-5 min and brisk walking Goal status: INITIAL  3.  Pt will be able to go up/ down >/= 2 flights of steps reporting </= min difficulty for improvement in mobility and condition.  Baseline:  Extreme difficulty at eval Goal status: INITIAL  4.  Pt to improve FOTO score to >/= 54% to demo improvement in function Baseline: initial score 40 Goal status: INITIAL  5.  Pt to be IND with all HEP and is able to maintain and progress their current LOF IND. Baseline: No previous HEP Goal status: INITIAL  PLAN:  PT FREQUENCY: 1-2x/week  PT DURATION: 8 weeks  PLANNED INTERVENTIONS: Therapeutic exercises, Therapeutic activity, Neuromuscular re-education, Balance training, Gait training, Patient/Family education, Self Care, Joint mobilization, Joint manipulation, Aquatic Therapy, Dry Needling, Cryotherapy, Moist heat, Taping, Manual therapy, and Re-evaluation.  PLAN FOR NEXT SESSION: Review/ update HEP PRN. Potential L SIJ involvement - hamstring stretching, L hip flexor activation / MET, core strengthening. Posture education.    Jadasia Haws PT, DPT, LAT, ATC  09/15/22  1:10 PM      Check all possible CPT codes: 16109 - PT Re-evaluation, 97110- Therapeutic Exercise, [phone]- Neuro Re-education, [phone] - Gait Training, [phone] - Manual Therapy, [phone] - Therapeutic Activities, and [phone] - Self Care    Check all conditions that are expected to impact treatment: {Conditions  expected to impact treatment:Morbid obesity and Medical complications related to COVID-19   If treatment provided at initial evaluation, no treatment charged due to lack of authorization.

## 2022-09-24 ENCOUNTER — Ambulatory Visit: Payer: Medicaid Other

## 2022-09-24 DIAGNOSIS — M5459 Other low back pain: Secondary | ICD-10-CM | POA: Diagnosis not present

## 2022-09-24 DIAGNOSIS — R293 Abnormal posture: Secondary | ICD-10-CM

## 2022-09-24 DIAGNOSIS — M62830 Muscle spasm of back: Secondary | ICD-10-CM

## 2022-09-24 NOTE — Therapy (Signed)
OUTPATIENT PHYSICAL THERAPY TREATMENT NOTE   Patient Name: Faith [name] MRN: [id number] DOB:[birth year], 32 y.o., female Today's Date: 09/24/2022  END OF SESSION:  PT End of Session - 09/24/22 1757     Visit Number 2    PT Start Time 1755    PT Stop Time 1830    PT Time Calculation (min) 35 min    Activity Tolerance Patient tolerated treatment well    Behavior During Therapy WFL for tasks assessed/performed              Past Medical History:  Diagnosis Date   Chlamydia    Hypertension    Infection    UTI   Morbid obesity (HCC)    Pulmonary embolism (HCC)    Urinary tract infection    Vaginal Pap smear, abnormal    f/u was ok   Past Surgical History:  Procedure Laterality Date   CESAREAN SECTION     COLPOSCOPY     DILATION AND EVACUATION N/A 10/04/2012   Procedure: DILATATION AND EVACUATION;  Surgeon: Lesly Dukes, MD;  Location: [facility];  Service: Gynecology;  Laterality: N/A;   LEEP  11/2016   Patient Active Problem List   Diagnosis Date Noted   Chest pain 08/16/2020   Pneumonia due to COVID-19 virus 04/23/2020   History of COVID-19 04/23/2020   Tachycardia 04/23/2020   COVID-19 virus infection 04/02/2020   Pulmonary embolism (HCC) 04/02/2020   Morbid obesity (HCC)    Abnormal Pap smear of vagina 01/31/2016    PCP: Barbette Merino, NP   REFERRING PROVIDER: Low back pain  REFERRING DIAG: Marcene Corning, MD   Rationale for Evaluation and Treatment: Rehabilitation  THERAPY DIAG:  Other low back pain  Muscle spasm of back  Abnormal posture  ONSET DATE: October 2023  SUBJECTIVE:                                                                                                                                                                                           SUBJECTIVE STATEMENT: Patient reports that her pain is about a 7/10 today.   PERTINENT HISTORY:  Obesity  PAIN:  Are you having pain? Yes: NPRS scale: 5/10, At worst  7/10, at best 1/10 Pain location: cramp, heavy tightness, constant   Pain description: low back with referred to the L/R Aggravating factors: prolonged standing, prolong walking, sitting up straight Relieving factors: muscle relaxer, ibuprofen, heating  PRECAUTIONS: None  WEIGHT BEARING RESTRICTIONS: No  FALLS:  Has patient fallen in last 6 months? 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   OBJECTIVE:   DIAGNOSTIC FINDINGS:  X-ray 06/26/22 Lumbar spine  IMPRESSION: Negative.  PATIENT SURVEYS:  FOTO 40% and predicted 54%  SCREENING FOR RED FLAGS: Bowel or bladder incontinence: No Cauda equina syndrome: No  COGNITION: Overall cognitive status: Within functional limits for tasks assessed     SENSATION: WFL   POSTURE: rounded shoulders and forward head  PALPATION: TTP along bil lumbar paraspinals and QL with L>R, and pain with palpation at the L SIJ/ fortins area.  LUMBAR ROM:   AROM eval  Flexion 70 ERP  Extension 20 ERP  Right lateral flexion 20  Left lateral flexion 20  Right rotation   Left rotation    (Blank rows = not tested)  LOWER EXTREMITY ROM:     Active  Right eval Left eval  Hip flexion    Hip extension    Hip abduction    Hip adduction    Hip internal rotation    Hip external rotation    Knee flexion    Knee extension    Ankle dorsiflexion    Ankle plantarflexion    Ankle inversion    Ankle eversion     (Blank rows = not tested)  LOWER EXTREMITY MMT:    MMT Right eval Left eval  Hip flexion 4+ 4+  Hip extension 4+ 4+  Hip abduction 4+ 4 *  Hip adduction 4+ 4+  Hip internal rotation    Hip  external rotation    Knee flexion 5 4+ *  Knee extension 5 4+  Ankle dorsiflexion    Ankle plantarflexion    Ankle inversion    Ankle eversion     (Blank rows = not tested) (* = concordant pain)  LUMBAR SPECIAL TESTS:  Quadrant test: Positive with trunk lean with ext/ L sidebend, SI Compression/distraction test: Postive, and forward flexion assessment reducted L SIJ superior movement  GAIT: Distance walked: 80 ft to treatment room Assistive device utilized: None Level of assistance: Complete Independence Comments: unremarkable.   TODAY'S TREATMENT:                [facility] Adult PT Treatment:                                                DATE: 09/24/2022  Therapeutic Exercise: SLR L 2 x 12 LTR 1 x 10 Hooklying ball squeeze, 5 sec hold x 20  Supine marches, 2 x 10  Supine posterior pelvic tilt x 20   Hooklying pallof press with green TB Seated ball roll (diagonals only), 2 x 5 to each side  Standing chops with 7# cable column x 10 each side  [facility] Adult PT Treatment:                                                DATE: 09/15/22 Therapeutic Exercise: Hamstring stretch PNF contract / relax with 10 sec contraction SLR L 2 x 10 LTR 1 x 15  Manual Therapy: MTPR along L QL and lumbar paraspinals x 2   PATIENT EDUCATION:  Education details: evaluation findings, POC, goals, HEP with proper form.  Person educated: Patient Education method: Explanation, Verbal cues, and Handouts Education comprehension: verbalized understanding  HOME EXERCISE PROGRAM: Access Code: [email or web address] URL: [email or web address] Date: 09/15/2022 Prepared by: Lulu Riding  Exercises - Supine Posterior Pelvic Tilt  - 1 x daily - 7 x weekly - 2 sets - 10 reps - Lower Trunk Rotation  - 1 x daily - 7 x weekly - 2 sets - 10 reps - Seated Flexion Stretch  - 3 x daily - 7 x weekly - 1-2  sets - 1-2 reps - 30 - 60 sec hold - Child's Pose Stretch  - 3 x daily - 7 x weekly - 1-2 sets - 1-2 reps - 30- 60 sec hold - Seated Hamstring Stretch  - 3 x daily - 7 x weekly - 2 sets - 2 reps - 30 hold - Supine Hamstring Stretch with Strap  - 1 x daily - 7 x weekly - 2 sets - 2 reps - 30 hold - SLR  - 1 x daily - 7 x weekly - 3 sets - 10-12 reps - 1 hold  ASSESSMENT:  CLINICAL IMPRESSION:  Faith [name] was able to progress through today core mobility and initial core strengthening activities well today without exacerbation of symptoms. She did report decreased pain severity from 7/10 to 5/10 following hooklying exercises. However, she reported return to 7/10 pain following standing core exercises. She will benefit from ongoing lumbar mobility activities and neutral spine strengthening at next visit.   OBJECTIVE IMPAIRMENTS: decreased activity tolerance, decreased endurance, decreased ROM, improper body mechanics, postural dysfunction, obesity, and pain.   ACTIVITY LIMITATIONS: standing, squatting, stairs, and locomotion level  PARTICIPATION LIMITATIONS: shopping, community activity, occupation, and yard work  PERSONAL FACTORS: Time since onset of injury/illness/exacerbation are also affecting patient's functional outcome.   REHAB POTENTIAL: Good  CLINICAL DECISION MAKING: Stable/uncomplicated  EVALUATION COMPLEXITY: Low   GOALS: Goals reviewed with patient? Yes  SHORT TERM GOALS: Target date: 10/13/2022   Pt to be IND with initial HEP for therapeutic progression Baseline: no previous HEP Goal status: INITIAL  2.  Pt to be able to verbalize/ demo efficient posture and lifting mechanics to reduce and prevent low back pain Baseline:  limited knowledge of posture Goal status: INITIAL  LONG TERM GOALS: Target date: 11/10/2022   Maintain functional trunk mobility reporting </= 2/10 max pain at end ranges for improvement in function. Baseline:  max pain is 7/10 Goal status:  INITIAL  2.  Pt to be able to stand/ walk for >/= 1 hour for endurance required for ADLs and work related tasks with </= 2/10 pain Baseline: pain started with standing for 3-5 min and brisk walking Goal status: INITIAL  3.  Pt will be able to go up/ down >/= 2 flights of steps reporting </= min difficulty for improvement in mobility and condition.  Baseline:  Extreme difficulty at eval Goal status: INITIAL  4.  Pt to improve FOTO score to >/= 54% to demo improvement in function Baseline: initial score 40 Goal status: INITIAL  5.  Pt to be IND with all HEP and is able to maintain and progress their current LOF IND. Baseline: No previous HEP Goal status: INITIAL  PLAN:  PT FREQUENCY: 1-2x/week  PT DURATION: 8 weeks  PLANNED INTERVENTIONS: Therapeutic exercises, Therapeutic activity, Neuromuscular re-education, Balance training, Gait training, Patient/Family education, Self Care, Joint mobilization, Joint manipulation, Aquatic Therapy, Dry Needling, Cryotherapy, Moist heat, Taping, Manual therapy, and Re-evaluation.  PLAN FOR NEXT SESSION: Review/ update HEP PRN. Potential L SIJ involvement - hamstring stretching, L hip flexor activation / MET, core strengthening. Posture education.    Mauri Reading, PT, DPT  09/24/22  7:05 PM

## 2022-09-30 ENCOUNTER — Ambulatory Visit: Payer: Medicaid Other | Admitting: Physical Therapy

## 2022-10-01 ENCOUNTER — Ambulatory Visit: Payer: Medicaid Other

## 2022-10-01 DIAGNOSIS — M62830 Muscle spasm of back: Secondary | ICD-10-CM

## 2022-10-01 DIAGNOSIS — R293 Abnormal posture: Secondary | ICD-10-CM

## 2022-10-01 DIAGNOSIS — M5459 Other low back pain: Secondary | ICD-10-CM

## 2022-10-01 NOTE — Therapy (Signed)
OUTPATIENT PHYSICAL THERAPY TREATMENT NOTE   Patient Name: AABHA KLOSKY MRN: [id number] DOB:1990/08/08, 32 y.o., female Today's Date: 10/01/2022  END OF SESSION:  PT End of Session - 10/01/22 1755     Visit Number 3    Number of Visits 9    Date for PT Re-Evaluation 11/10/22    Authorization Type [employer]    PT Start Time 1750    PT Stop Time 1830    PT Time Calculation (min) 40 min    Activity Tolerance Patient tolerated treatment well    Behavior During Therapy WFL for tasks assessed/performed               Past Medical History:  Diagnosis Date   Chlamydia    Hypertension    Infection    UTI   Morbid obesity (HCC)    Pulmonary embolism (HCC)    Urinary tract infection    Vaginal Pap smear, abnormal    f/u was ok   Past Surgical History:  Procedure Laterality Date   CESAREAN SECTION     COLPOSCOPY     DILATION AND EVACUATION N/A 10/04/2012   Procedure: DILATATION AND EVACUATION;  Surgeon: Lesly Dukes, MD;  Location: [facility];  Service: Gynecology;  Laterality: N/A;   LEEP  11/2016   Patient Active Problem List   Diagnosis Date Noted   Chest pain 08/16/2020   Pneumonia due to COVID-19 virus 04/23/2020   History of COVID-19 04/23/2020   Tachycardia 04/23/2020   COVID-19 virus infection 04/02/2020   Pulmonary embolism (HCC) 04/02/2020   Morbid obesity (HCC)    Abnormal Pap smear of vagina 01/31/2016    PCP: Barbette Merino, NP   REFERRING PROVIDER: Low back pain  REFERRING DIAG: Marcene Corning, MD   Rationale for Evaluation and Treatment: Rehabilitation  THERAPY DIAG:  Other low back pain  Muscle spasm of back  Abnormal posture  ONSET DATE: October 2023  SUBJECTIVE:                                                                                                                                                                                           SUBJECTIVE STATEMENT: Patient reports that her pain is about a 7/10  today. "I feel stiff."   PERTINENT HISTORY:  Obesity  PAIN:  Are you having pain? Yes: NPRS scale: 5/10, At worst 7/10, at best 1/10 Pain location: cramp, heavy tightness, constant   Pain description: low back with referred to the L/R Aggravating factors: prolonged standing, prolong walking, sitting up straight Relieving factors: muscle relaxer, ibuprofen, heating  PRECAUTIONS: None  WEIGHT BEARING RESTRICTIONS:  No  FALLS:  Has patient fallen in last 6 months? 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   OBJECTIVE:   DIAGNOSTIC FINDINGS:  X-ray 06/26/22 Lumbar spine  IMPRESSION: Negative.  PATIENT SURVEYS:  FOTO 40% and predicted 54%  SCREENING FOR RED FLAGS: Bowel or bladder incontinence: No Cauda equina syndrome: No  COGNITION: Overall cognitive status: Within functional limits for tasks assessed     SENSATION: WFL   POSTURE: rounded shoulders and forward head  PALPATION: TTP along bil lumbar paraspinals and QL with L>R, and pain with palpation at the L SIJ/ fortins area.  LUMBAR ROM:   AROM eval  Flexion 70 ERP  Extension 20 ERP  Right lateral flexion 20  Left lateral flexion 20  Right rotation   Left rotation    (Blank rows = not tested)  LOWER EXTREMITY ROM:     Active  Right eval Left eval  Hip flexion    Hip extension    Hip abduction    Hip adduction    Hip internal rotation    Hip external rotation    Knee flexion    Knee extension    Ankle dorsiflexion    Ankle plantarflexion    Ankle inversion    Ankle eversion     (Blank rows = not tested)  LOWER EXTREMITY MMT:    MMT Right eval Left eval   Hip flexion 4+ 4+  Hip extension 4+ 4+  Hip abduction 4+ 4 *  Hip adduction 4+ 4+  Hip internal rotation    Hip external rotation    Knee flexion 5 4+ *  Knee extension 5 4+  Ankle dorsiflexion    Ankle plantarflexion    Ankle inversion    Ankle eversion     (Blank rows = not tested) (* = concordant pain)  LUMBAR SPECIAL TESTS:  Quadrant test: Positive with trunk lean with ext/ L sidebend, SI Compression/distraction test: Postive, and forward flexion assessment reducted L SIJ superior movement  GAIT: Distance walked: 80 ft to treatment room Assistive device utilized: None Level of assistance: Complete Independence Comments: unremarkable.   TODAY'S TREATMENT:                [facility] Adult PT Treatment:                                                DATE: 10/01/2022  Therapeutic Exercise: SLR 2 x 12 LTR 2 x 10, second set with overpressure by clinician  Seated Hooklying ball squeeze, 5 sec hold x 20  Seated marches, 2 x 10 with RTB around feet Seated Pallof press with green TB x 10, followed by 10 with varied resistance and direction of pull by clinician  Standing ball roll 3-way x 5 to each side (flexion exacerbated symptoms, she had decreased symptoms with diagonal roll)  Standing chops with 10# cable column x 15 each side   Modalities:  Moist Heat pack x 5 minutes, followed by 6 min concurrent with seated marches and pallof press   [facility]  Adult PT Treatment:                                                DATE: 09/24/2022  Therapeutic Exercise: SLR L 2 x 12 LTR 1 x 10 Hooklying ball squeeze, 5 sec hold x 20  Supine marches, 2 x 10  Supine posterior pelvic tilt x 20   Hooklying pallof press with green TB Seated ball roll (diagonals only), 2 x 5 to each side  Standing chops with 7# cable column x 10 each side      [facility] Adult PT Treatment:                                                DATE: 09/15/22 Therapeutic Exercise: Hamstring stretch PNF contract / relax with 10  sec contraction SLR L 2 x 10 LTR 1 x 15  Manual Therapy: MTPR along L QL and lumbar paraspinals x 2   PATIENT EDUCATION:  Education details: evaluation findings, POC, goals, HEP with proper form.  Person educated: Patient Education method: Explanation, Verbal cues, and Handouts Education comprehension: verbalized understanding  HOME EXERCISE PROGRAM: Access Code: [email or web address] URL: [email or web address] Date: 09/15/2022 Prepared by: Lulu Riding  Exercises - Supine Posterior Pelvic Tilt  - 1 x daily - 7 x weekly - 2 sets - 10 reps - Lower Trunk Rotation  - 1 x daily - 7 x weekly - 2 sets - 10 reps - Seated Flexion Stretch  - 3 x daily - 7 x weekly - 1-2 sets - 1-2 reps - 30 - 60 sec hold - Child's Pose Stretch  - 3 x daily - 7 x weekly - 1-2 sets - 1-2 reps - 30- 60 sec hold - Seated Hamstring Stretch  - 3 x daily - 7 x weekly - 2 sets - 2 reps - 30 hold - Supine Hamstring Stretch with Strap  - 1 x daily - 7 x weekly - 2 sets - 2 reps - 30 hold - SLR  - 1 x daily - 7 x weekly - 3 sets - 10-12 reps - 1 hold  ASSESSMENT:  CLINICAL IMPRESSION:  Patient was able to tolerate progression of neutral core strengthening exercises today. She continues to respond well to lumbar mobility activities intermittently for pain modulation throughout today's session. We will continue to progress core strengthening activities as tolerated.   OBJECTIVE IMPAIRMENTS: decreased activity tolerance, decreased endurance, decreased ROM, improper body mechanics, postural dysfunction, obesity, and pain.   ACTIVITY LIMITATIONS: standing, squatting, stairs, and locomotion level  PARTICIPATION LIMITATIONS: shopping, community activity, occupation, and yard work  PERSONAL FACTORS: Time since onset of injury/illness/exacerbation are also affecting patient's functional outcome.   REHAB POTENTIAL: Good  CLINICAL DECISION MAKING: Stable/uncomplicated  EVALUATION COMPLEXITY:  Low   GOALS: Goals reviewed with patient? Yes  SHORT TERM GOALS: Target date: 10/13/2022   Pt to be IND with initial HEP for therapeutic progression Baseline: no previous HEP Goal status: INITIAL  2.  Pt to be able to verbalize/ demo efficient posture and lifting mechanics to reduce and prevent low back pain Baseline:  limited knowledge of posture Goal status: INITIAL  LONG TERM GOALS: Target date: 11/10/2022   Maintain functional trunk mobility reporting </=  2/10 max pain at end ranges for improvement in function. Baseline:  max pain is 7/10 Goal status: INITIAL  2.  Pt to be able to stand/ walk for >/= 1 hour for endurance required for ADLs and work related tasks with </= 2/10 pain Baseline: pain started with standing for 3-5 min and brisk walking Goal status: INITIAL  3.  Pt will be able to go up/ down >/= 2 flights of steps reporting </= min difficulty for improvement in mobility and condition.  Baseline:  Extreme difficulty at eval Goal status: INITIAL  4.  Pt to improve FOTO score to >/= 54% to demo improvement in function Baseline: initial score 40 Goal status: INITIAL  5.  Pt to be IND with all HEP and is able to maintain and progress their current LOF IND. Baseline: No previous HEP Goal status: INITIAL  PLAN:  PT FREQUENCY: 1-2x/week  PT DURATION: 8 weeks  PLANNED INTERVENTIONS: Therapeutic exercises, Therapeutic activity, Neuromuscular re-education, Balance training, Gait training, Patient/Family education, Self Care, Joint mobilization, Joint manipulation, Aquatic Therapy, Dry Needling, Cryotherapy, Moist heat, Taping, Manual therapy, and Re-evaluation.  PLAN FOR NEXT SESSION: Review/ update HEP PRN. Potential L SIJ involvement - hamstring stretching, L hip flexor activation / MET, core strengthening. Posture education.    Mauri Reading, PT, DPT  10/01/22  6:30 PM

## 2022-10-08 ENCOUNTER — Ambulatory Visit: Payer: Medicaid Other | Attending: Orthopaedic Surgery

## 2022-10-08 DIAGNOSIS — M62830 Muscle spasm of back: Secondary | ICD-10-CM | POA: Diagnosis present

## 2022-10-08 DIAGNOSIS — M5459 Other low back pain: Secondary | ICD-10-CM | POA: Diagnosis present

## 2022-10-08 DIAGNOSIS — R293 Abnormal posture: Secondary | ICD-10-CM | POA: Insufficient documentation

## 2022-10-08 NOTE — Therapy (Signed)
OUTPATIENT PHYSICAL THERAPY TREATMENT NOTE   Patient Name: Faith [name] MRN: [id number] DOB:06/01/1990, 32 y.o., female Today's Date: 10/08/2022  END OF SESSION:  PT End of Session - 10/08/22 1831     Visit Number 4    Number of Visits 9    Date for PT Re-Evaluation 11/10/22    Authorization Type [employer]    PT Start Time 1754    PT Stop Time 1829    PT Time Calculation (min) 35 min    Activity Tolerance Patient tolerated treatment well    Behavior During Therapy WFL for tasks assessed/performed                Past Medical History:  Diagnosis Date   Chlamydia    Hypertension    Infection    UTI   Morbid obesity (HCC)    Pulmonary embolism (HCC)    Urinary tract infection    Vaginal Pap smear, abnormal    f/u was ok   Past Surgical History:  Procedure Laterality Date   CESAREAN SECTION     COLPOSCOPY     DILATION AND EVACUATION N/A 10/04/2012   Procedure: DILATATION AND EVACUATION;  Surgeon: Lesly Dukes, MD;  Location: [facility];  Service: Gynecology;  Laterality: N/A;   LEEP  11/2016   Patient Active Problem List   Diagnosis Date Noted   Chest pain 08/16/2020   Pneumonia due to COVID-19 virus 04/23/2020   History of COVID-19 04/23/2020   Tachycardia 04/23/2020   COVID-19 virus infection 04/02/2020   Pulmonary embolism (HCC) 04/02/2020   Morbid obesity (HCC)    Abnormal Pap smear of vagina 01/31/2016    PCP: Barbette Merino, NP  REFERRING PROVIDER: Low back pain  REFERRING DIAG: Marcene Corning, MD   Rationale for Evaluation and Treatment: Rehabilitation  THERAPY DIAG:  Other low back pain  Muscle spasm of back  Abnormal posture  ONSET DATE: October 2023  SUBJECTIVE:                                                                                                                                                                                           SUBJECTIVE STATEMENT: Patient is reporting that her symptoms are about  the same today, but denies any worsening of symptoms.   PERTINENT HISTORY:  Obesity  PAIN:  Are you having pain? Yes: NPRS scale: 5/10, At worst 7/10, at best 1/10 Pain location: cramp, heavy tightness, constant   Pain description: low back with referred to the L/R Aggravating factors: prolonged standing, prolong walking, sitting up straight Relieving factors: muscle relaxer, ibuprofen, heating  PRECAUTIONS: None  WEIGHT BEARING RESTRICTIONS: No  FALLS:  Has patient fallen in last 6 months? 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   OBJECTIVE:   DIAGNOSTIC FINDINGS:  X-ray 06/26/22 Lumbar spine  IMPRESSION: Negative.  PATIENT SURVEYS:  FOTO 40% and predicted 54%  SCREENING FOR RED FLAGS: Bowel or bladder incontinence: No Cauda equina syndrome: No  COGNITION: Overall cognitive status: Within functional limits for tasks assessed     SENSATION: WFL   POSTURE: rounded shoulders and forward head  PALPATION: TTP along bil lumbar paraspinals and QL with L>R, and pain with palpation at the L SIJ/ fortins area.  LUMBAR ROM:   AROM eval  Flexion 70 ERP  Extension 20 ERP  Right lateral flexion 20  Left lateral flexion 20  Right rotation   Left rotation    (Blank rows = not tested)  LOWER EXTREMITY ROM:     Active  Right eval Left eval  Hip flexion    Hip extension    Hip abduction    Hip adduction    Hip internal rotation    Hip external rotation    Knee flexion    Knee extension    Ankle dorsiflexion    Ankle plantarflexion    Ankle inversion    Ankle eversion     (Blank rows = not tested)  LOWER EXTREMITY MMT:     MMT Right eval Left eval  Hip flexion 4+ 4+  Hip extension 4+ 4+  Hip abduction 4+ 4 *  Hip adduction 4+ 4+  Hip internal rotation    Hip external rotation    Knee flexion 5 4+ *  Knee extension 5 4+  Ankle dorsiflexion    Ankle plantarflexion    Ankle inversion    Ankle eversion     (Blank rows = not tested) (* = concordant pain)  LUMBAR SPECIAL TESTS:  Quadrant test: Positive with trunk lean with ext/ L sidebend, SI Compression/distraction test: Postive, and forward flexion assessment reducted L SIJ superior movement  GAIT: Distance walked: 80 ft to treatment room Assistive device utilized: None Level of assistance: Complete Independence Comments: unremarkable.   TODAY'S TREATMENT:          [facility] Adult PT Treatment:                                                DATE: 10/08/2022  Therapeutic Exercise: SLR 2 x 15 LTR x 10 each side second set with overpressure by clinician  Seated Hooklying ball squeeze, 5 sec hold x 20  Seated marches, 2 x 10 with RTB around feet DKTC with small pball x 20  LTR x 10 with small pball  Standing ball roll 3-way x 5 to each side (diagonals only) Standing chops with 13# cable column, 2 x 10 each side  NuStep x 5 minutes       [facility] Adult PT Treatment:  DATE: 10/01/2022  Therapeutic Exercise: SLR 2 x 12 LTR 2 x 10, second set with overpressure by clinician  Seated Hooklying ball squeeze, 5 sec hold x 20  Seated marches, 2 x 10 with RTB around feet Seated Pallof press with green TB x 10, followed by 10 with varied resistance and direction of pull by clinician  Standing ball roll 3-way x 5 to each side (flexion exacerbated symptoms, she had decreased symptoms with diagonal roll)  Standing chops with 10# cable column x 15 each side   Modalities:  Moist Heat pack x 5 minutes, followed by 6 min concurrent with seated marches and pallof press   [facility] Adult PT Treatment:                                                 DATE: 09/24/2022  Therapeutic Exercise: SLR L 2 x 12 LTR 1 x 10 Hooklying ball squeeze, 5 sec hold x 20  Supine marches, 2 x 10  Supine posterior pelvic tilt x 20   Hooklying pallof press with green TB Seated ball roll (diagonals only), 2 x 5 to each side  Standing chops with 7# cable column x 10 each side     PATIENT EDUCATION:  Education details: evaluation findings, POC, goals, HEP with proper form.  Person educated: Patient Education method: Explanation, Verbal cues, and Handouts Education comprehension: verbalized understanding  HOME EXERCISE PROGRAM: Access Code: [email or web address] URL: [email or web address] Date: 09/15/2022 Prepared by: Lulu Riding  Exercises - Supine Posterior Pelvic Tilt  - 1 x daily - 7 x weekly - 2 sets - 10 reps - Lower Trunk Rotation  - 1 x daily - 7 x weekly - 2 sets - 10 reps - Seated Flexion Stretch  - 3 x daily - 7 x weekly - 1-2 sets - 1-2 reps - 30 - 60 sec hold - Child's Pose Stretch  - 3 x daily - 7 x weekly - 1-2 sets - 1-2 reps - 30- 60 sec hold - Seated Hamstring Stretch  - 3 x daily - 7 x weekly - 2 sets - 2 reps - 30 hold - Supine Hamstring Stretch with Strap  - 1 x daily - 7 x weekly - 2 sets - 2 reps - 30 hold - SLR  - 1 x daily - 7 x weekly - 3 sets - 10-12 reps - 1 hold  ASSESSMENT:  CLINICAL IMPRESSION:  Sowmya was able to tolerate some progression of exercises today. However, she had increased symptoms with supine knee flexion and standing chops. She may benefit from neutral core strengthening activities in hooklying at next visit before returning to current program.   OBJECTIVE IMPAIRMENTS: decreased activity tolerance, decreased endurance, decreased ROM, improper body mechanics, postural dysfunction, obesity, and pain.   ACTIVITY LIMITATIONS: standing, squatting, stairs, and locomotion level  PARTICIPATION LIMITATIONS: shopping, community activity, occupation, and yard work  PERSONAL  FACTORS: Time since onset of injury/illness/exacerbation are also affecting patient's functional outcome.   REHAB POTENTIAL: Good  CLINICAL DECISION MAKING: Stable/uncomplicated  EVALUATION COMPLEXITY: Low   GOALS: Goals reviewed with patient? Yes  SHORT TERM GOALS: Target date: 10/13/2022   Pt to be IND with initial HEP for therapeutic progression Baseline: no previous HEP Goal status: INITIAL  2.  Pt to be able to verbalize/ demo efficient posture and lifting mechanics to reduce and prevent  low back pain Baseline:  limited knowledge of posture Goal status: INITIAL  LONG TERM GOALS: Target date: 11/10/2022   Maintain functional trunk mobility reporting </= 2/10 max pain at end ranges for improvement in function. Baseline:  max pain is 7/10 Goal status: INITIAL  2.  Pt to be able to stand/ walk for >/= 1 hour for endurance required for ADLs and work related tasks with </= 2/10 pain Baseline: pain started with standing for 3-5 min and brisk walking Goal status: INITIAL  3.  Pt will be able to go up/ down >/= 2 flights of steps reporting </= min difficulty for improvement in mobility and condition.  Baseline:  Extreme difficulty at eval Goal status: INITIAL  4.  Pt to improve FOTO score to >/= 54% to demo improvement in function Baseline: initial score 40 Goal status: INITIAL  5.  Pt to be IND with all HEP and is able to maintain and progress their current LOF IND. Baseline: No previous HEP Goal status: INITIAL  PLAN:  PT FREQUENCY: 1-2x/week  PT DURATION: 8 weeks  PLANNED INTERVENTIONS: Therapeutic exercises, Therapeutic activity, Neuromuscular re-education, Balance training, Gait training, Patient/Family education, Self Care, Joint mobilization, Joint manipulation, Aquatic Therapy, Dry Needling, Cryotherapy, Moist heat, Taping, Manual therapy, and Re-evaluation.  PLAN FOR NEXT SESSION: Review/ update HEP PRN. Potential L SIJ involvement - hamstring stretching, L hip  flexor activation / MET, core strengthening. Posture education.    Mauri Reading, PT, DPT  10/08/22  6:37 PM

## 2022-10-15 ENCOUNTER — Ambulatory Visit: Payer: Medicaid Other

## 2022-10-15 DIAGNOSIS — R293 Abnormal posture: Secondary | ICD-10-CM

## 2022-10-15 DIAGNOSIS — M5459 Other low back pain: Secondary | ICD-10-CM

## 2022-10-15 DIAGNOSIS — M62830 Muscle spasm of back: Secondary | ICD-10-CM

## 2022-10-15 NOTE — Therapy (Signed)
OUTPATIENT PHYSICAL THERAPY TREATMENT NOTE   Patient Name: Faith [name] MRN: [id number] DOB:1991/03/17, 32 y.o., female Today's Date: 10/15/2022  END OF SESSION:  PT End of Session - 10/15/22 1810     Visit Number 5    Number of Visits 9    Date for PT Re-Evaluation 11/10/22    PT Start Time 1748    PT Stop Time 1822    PT Time Calculation (min) 34 min    Activity Tolerance Patient tolerated treatment well    Behavior During Therapy WFL for tasks assessed/performed                 Past Medical History:  Diagnosis Date   Chlamydia    Hypertension    Infection    UTI   Morbid obesity (HCC)    Pulmonary embolism (HCC)    Urinary tract infection    Vaginal Pap smear, abnormal    f/u was ok   Past Surgical History:  Procedure Laterality Date   CESAREAN SECTION     COLPOSCOPY     DILATION AND EVACUATION N/A 10/04/2012   Procedure: DILATATION AND EVACUATION;  Surgeon: Lesly Dukes, MD;  Location: [facility];  Service: Gynecology;  Laterality: N/A;   LEEP  11/2016   Patient Active Problem List   Diagnosis Date Noted   Chest pain 08/16/2020   Pneumonia due to COVID-19 virus 04/23/2020   History of COVID-19 04/23/2020   Tachycardia 04/23/2020   COVID-19 virus infection 04/02/2020   Pulmonary embolism (HCC) 04/02/2020   Morbid obesity (HCC)    Abnormal Pap smear of vagina 01/31/2016    PCP: Barbette Merino, NP  REFERRING PROVIDER: Low back pain  REFERRING DIAG: Marcene Corning, MD   Rationale for Evaluation and Treatment: Rehabilitation  THERAPY DIAG:  Other low back pain  Muscle spasm of back  Abnormal posture  ONSET DATE: October 2023  SUBJECTIVE:                                                                                                                                                                                           SUBJECTIVE STATEMENT: Patient reports that she had significant soreness and pain following last session and  had to take tylenol and muscle relaxer.   PERTINENT HISTORY:  Obesity  PAIN:  Are you having pain? Yes: NPRS scale: 5/10, At worst 7/10, at best 1/10 Pain location: cramp, heavy tightness, constant   Pain description: low back with referred to the L/R Aggravating factors: prolonged standing, prolong walking, sitting up straight Relieving factors: muscle relaxer, ibuprofen, heating  PRECAUTIONS: None  WEIGHT BEARING RESTRICTIONS:  No  FALLS:  Has patient fallen in last 6 months? 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   OBJECTIVE:   DIAGNOSTIC FINDINGS:  X-ray 06/26/22 Lumbar spine  IMPRESSION: Negative.  PATIENT SURVEYS:  FOTO 40% and predicted 54%  SCREENING FOR RED FLAGS: Bowel or bladder incontinence: No Cauda equina syndrome: No  COGNITION: Overall cognitive status: Within functional limits for tasks assessed     SENSATION: WFL   POSTURE: rounded shoulders and forward head  PALPATION: TTP along bil lumbar paraspinals and QL with L>R, and pain with palpation at the L SIJ/ fortins area.  LUMBAR ROM:   AROM eval  Flexion 70 ERP  Extension 20 ERP  Right lateral flexion 20  Left lateral flexion 20  Right rotation   Left rotation    (Blank rows = not tested)  LOWER EXTREMITY ROM:     Active  Right eval Left eval  Hip flexion    Hip extension    Hip abduction    Hip adduction    Hip internal rotation    Hip external rotation    Knee flexion    Knee extension    Ankle dorsiflexion    Ankle plantarflexion    Ankle inversion    Ankle eversion     (Blank rows = not tested)  LOWER EXTREMITY MMT:    MMT  Right eval Left eval  Hip flexion 4+ 4+  Hip extension 4+ 4+  Hip abduction 4+ 4 *  Hip adduction 4+ 4+  Hip internal rotation    Hip external rotation    Knee flexion 5 4+ *  Knee extension 5 4+  Ankle dorsiflexion    Ankle plantarflexion    Ankle inversion    Ankle eversion     (Blank rows = not tested) (* = concordant pain)  LUMBAR SPECIAL TESTS:  Quadrant test: Positive with trunk lean with ext/ L sidebend, SI Compression/distraction test: Postive, and forward flexion assessment reducted L SIJ superior movement  GAIT: Distance walked: 80 ft to treatment room Assistive device utilized: None Level of assistance: Complete Independence Comments: unremarkable.   TODAY'S TREATMENT:         [facility] Adult PT Treatment:                                                DATE: 10/15/2022  Therapeutic Exercise: SLR 2 x 15 LTR x 10 each side second set with overpressure by clinician  Seated Hooklying ball squeeze, 5 sec hold x 20  Supine 90-90 marches (up up down down), 2 x 5 with RTB around feet DKTC with small pball x 20  LTR x 15 with small pball  Standing ball roll 3-way x 5 to each side (diagonals only) NuStep x 5 minutes  Discontinue standing chops at this time Standing hip flexion, abduction, 2 x 10 each bilaterally at counter, cueing for abdominal bracing    [facility] Adult PT Treatment:  DATE: 10/08/2022  Therapeutic Exercise: SLR 2 x 15 LTR x 10 each side second set with overpressure by clinician  Seated Hooklying ball squeeze, 5 sec hold x 20  Seated marches, 2 x 10 with RTB around feet DKTC with small pball x 20  LTR x 10 with small pball  Standing ball roll 3-way x 5 to each side (diagonals only) Standing chops with 13# cable column, 2 x 10 each side  NuStep x 5 minutes       [facility] Adult PT Treatment:                                                DATE: 10/01/2022  Therapeutic Exercise: SLR 2 x 12 LTR 2 x 10, second set  with overpressure by clinician  Seated Hooklying ball squeeze, 5 sec hold x 20  Seated marches, 2 x 10 with RTB around feet Seated Pallof press with green TB x 10, followed by 10 with varied resistance and direction of pull by clinician  Standing ball roll 3-way x 5 to each side (flexion exacerbated symptoms, she had decreased symptoms with diagonal roll)  Standing chops with 10# cable column x 15 each side   Modalities:  Moist Heat pack x 5 minutes, followed by 6 min concurrent with seated marches and pallof press      PATIENT EDUCATION:  Education details: evaluation findings, POC, goals, HEP with proper form.  Person educated: Patient Education method: Explanation, Verbal cues, and Handouts Education comprehension: verbalized understanding  HOME EXERCISE PROGRAM: Access Code: [email or web address] URL: [email or web address] Date: 09/15/2022 Prepared by: Lulu Riding  Exercises - Supine Posterior Pelvic Tilt  - 1 x daily - 7 x weekly - 2 sets - 10 reps - Lower Trunk Rotation  - 1 x daily - 7 x weekly - 2 sets - 10 reps - Seated Flexion Stretch  - 3 x daily - 7 x weekly - 1-2 sets - 1-2 reps - 30 - 60 sec hold - Child's Pose Stretch  - 3 x daily - 7 x weekly - 1-2 sets - 1-2 reps - 30- 60 sec hold - Seated Hamstring Stretch  - 3 x daily - 7 x weekly - 2 sets - 2 reps - 30 hold - Supine Hamstring Stretch with Strap  - 1 x daily - 7 x weekly - 2 sets - 2 reps - 30 hold - SLR  - 1 x daily - 7 x weekly - 3 sets - 10-12 reps - 1 hold  ASSESSMENT:  CLINICAL IMPRESSION:  Faith [name] did well with today's exercise and denies exacerbation of symptoms. She did report some lower back pressure with standing hip abduction, that relieved with completion of that activities. We will continue to progress for improved core endurance at next visit.     OBJECTIVE IMPAIRMENTS: decreased activity tolerance, decreased endurance, decreased ROM, improper body mechanics, postural dysfunction,  obesity, and pain.   ACTIVITY LIMITATIONS: standing, squatting, stairs, and locomotion level  PARTICIPATION LIMITATIONS: shopping, community activity, occupation, and yard work  PERSONAL FACTORS: Time since onset of injury/illness/exacerbation are also affecting patient's functional outcome.   REHAB POTENTIAL: Good  CLINICAL DECISION MAKING: Stable/uncomplicated  EVALUATION COMPLEXITY: Low   GOALS: Goals reviewed with patient? Yes  SHORT TERM GOALS: Target date: 10/13/2022   Pt to be IND with initial HEP for therapeutic progression Baseline: no  previous HEP Goal status: INITIAL  2.  Pt to be able to verbalize/ demo efficient posture and lifting mechanics to reduce and prevent low back pain Baseline:  limited knowledge of posture Goal status: INITIAL  LONG TERM GOALS: Target date: 11/10/2022   Maintain functional trunk mobility reporting </= 2/10 max pain at end ranges for improvement in function. Baseline:  max pain is 7/10 Goal status: INITIAL  2.  Pt to be able to stand/ walk for >/= 1 hour for endurance required for ADLs and work related tasks with </= 2/10 pain Baseline: pain started with standing for 3-5 min and brisk walking Goal status: INITIAL  3.  Pt will be able to go up/ down >/= 2 flights of steps reporting </= min difficulty for improvement in mobility and condition.  Baseline:  Extreme difficulty at eval Goal status: INITIAL  4.  Pt to improve FOTO score to >/= 54% to demo improvement in function Baseline: initial score 40 Goal status: INITIAL  5.  Pt to be IND with all HEP and is able to maintain and progress their current LOF IND. Baseline: No previous HEP Goal status: INITIAL  PLAN:  PT FREQUENCY: 1-2x/week  PT DURATION: 8 weeks  PLANNED INTERVENTIONS: Therapeutic exercises, Therapeutic activity, Neuromuscular re-education, Balance training, Gait training, Patient/Family education, Self Care, Joint mobilization, Joint manipulation, Aquatic  Therapy, Dry Needling, Cryotherapy, Moist heat, Taping, Manual therapy, and Re-evaluation.  PLAN FOR NEXT SESSION: Review/ update HEP PRN. Potential L SIJ involvement - hamstring stretching, L hip flexor activation / MET, core strengthening. Posture education.    Mauri Reading, PT, DPT  10/15/22  6:33 PM

## 2022-10-22 ENCOUNTER — Ambulatory Visit: Payer: Medicaid Other

## 2022-10-22 DIAGNOSIS — M62830 Muscle spasm of back: Secondary | ICD-10-CM

## 2022-10-22 DIAGNOSIS — M5459 Other low back pain: Secondary | ICD-10-CM

## 2022-10-22 DIAGNOSIS — R293 Abnormal posture: Secondary | ICD-10-CM

## 2022-10-22 NOTE — Therapy (Signed)
OUTPATIENT PHYSICAL THERAPY TREATMENT NOTE   Patient Name: Faith [name] MRN: [id number] DOB:02-Dec-1990, 32 y.o., female Today's Date: 10/22/2022  END OF SESSION:  PT End of Session - 10/22/22 1809     Visit Number 6    Number of Visits 9    Date for PT Re-Evaluation 11/10/22    Authorization Type [employer]    PT Start Time 1751    PT Stop Time 1830    PT Time Calculation (min) 39 min    Activity Tolerance Patient tolerated treatment well    Behavior During Therapy WFL for tasks assessed/performed                  Past Medical History:  Diagnosis Date   Chlamydia    Hypertension    Infection    UTI   Morbid obesity (HCC)    Pulmonary embolism (HCC)    Urinary tract infection    Vaginal Pap smear, abnormal    f/u was ok   Past Surgical History:  Procedure Laterality Date   CESAREAN SECTION     COLPOSCOPY     DILATION AND EVACUATION N/A 10/04/2012   Procedure: DILATATION AND EVACUATION;  Surgeon: Lesly Dukes, MD;  Location: [facility];  Service: Gynecology;  Laterality: N/A;   LEEP  11/2016   Patient Active Problem List   Diagnosis Date Noted   Chest pain 08/16/2020   Pneumonia due to COVID-19 virus 04/23/2020   History of COVID-19 04/23/2020   Tachycardia 04/23/2020   COVID-19 virus infection 04/02/2020   Pulmonary embolism (HCC) 04/02/2020   Morbid obesity (HCC)    Abnormal Pap smear of vagina 01/31/2016    PCP: Barbette Merino, NP  REFERRING PROVIDER: Low back pain  REFERRING DIAG: Marcene Corning, MD   Rationale for Evaluation and Treatment: Rehabilitation  THERAPY DIAG:  Other low back pain  Muscle spasm of back  Abnormal posture  ONSET DATE: October 2023  SUBJECTIVE:                                                                                                                                                                                           SUBJECTIVE STATEMENT: Patient reports that she was with family  dancing over the weekend, which exacerbated her lumbar pain some.   PERTINENT HISTORY:  Obesity  PAIN:  Are you having pain? Yes: NPRS scale: 5/10, At worst 7/10, at best 1/10 Pain location: cramp, heavy tightness, constant   Pain description: low back with referred to the L/R Aggravating factors: prolonged standing, prolong walking, sitting up straight Relieving factors: muscle relaxer, ibuprofen, heating  PRECAUTIONS: None  WEIGHT BEARING RESTRICTIONS: No  FALLS:  Has patient fallen in last 6 months? 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   OBJECTIVE:   DIAGNOSTIC FINDINGS:  X-ray 06/26/22 Lumbar spine  IMPRESSION: Negative.  PATIENT SURVEYS:  FOTO 40% and predicted 54%  SCREENING FOR RED FLAGS: Bowel or bladder incontinence: No Cauda equina syndrome: No  COGNITION: Overall cognitive status: Within functional limits for tasks assessed     SENSATION: WFL   POSTURE: rounded shoulders and forward head  PALPATION: TTP along bil lumbar paraspinals and QL with L>R, and pain with palpation at the L SIJ/ fortins area.  LUMBAR ROM:   AROM eval  Flexion 70 ERP  Extension 20 ERP  Right lateral flexion 20  Left lateral flexion 20  Right rotation   Left rotation    (Blank rows = not tested)  LOWER EXTREMITY ROM:     Active  Right eval Left eval  Hip flexion    Hip extension    Hip abduction    Hip adduction    Hip internal rotation    Hip external rotation    Knee flexion    Knee extension    Ankle dorsiflexion    Ankle plantarflexion    Ankle inversion    Ankle eversion     (Blank rows = not tested)  LOWER  EXTREMITY MMT:    MMT Right eval Left eval  Hip flexion 4+ 4+  Hip extension 4+ 4+  Hip abduction 4+ 4 *  Hip adduction 4+ 4+  Hip internal rotation    Hip external rotation    Knee flexion 5 4+ *  Knee extension 5 4+  Ankle dorsiflexion    Ankle plantarflexion    Ankle inversion    Ankle eversion     (Blank rows = not tested) (* = concordant pain)  LUMBAR SPECIAL TESTS:  Quadrant test: Positive with trunk lean with ext/ L sidebend, SI Compression/distraction test: Postive, and forward flexion assessment reducted L SIJ superior movement  GAIT: Distance walked: 80 ft to treatment room Assistive device utilized: None Level of assistance: Complete Independence Comments: unremarkable.   TODAY'S TREATMENT:          [facility] Adult PT Treatment:                                                DATE: 10/22/2022  Therapeutic Exercise: NuStep x 5 minutes  SLR 2 x 10 LTR x 10 with red pball DKTC with small pball x 20  Seated Hooklying ball squeeze, 5 sec hold x 20  Supine 90-90 (up up down down) x 15 with one rest break  Standing ball roll 3-way x 5 to each side (diagonals only) Seated abd iso with pball press downs, concurrent with MHP x 2 min, 3 sec hold x several   Moist Heat Pack:  Seated with lumbar heat pack x 12 min  [facility] Adult PT Treatment:  DATE: 10/15/2022  Therapeutic Exercise: SLR 2 x 15 LTR x 10 each side second set with overpressure by clinician  Seated Hooklying ball squeeze, 5 sec hold x 20  Supine 90-90   (up up down down), 2 x 5 with RTB around feet DKTC with small pball x 20  LTR x 15 with small pball  Standing ball roll 3-way x 5 to each side (diagonals only) NuStep x 5 minutes  Discontinue standing chops at this time Standing hip flexion, abduction, 2 x 10 each bilaterally at counter, cueing for abdominal bracing    [facility] Adult PT Treatment:                                                DATE:  10/08/2022  Therapeutic Exercise: SLR 2 x 15 LTR x 10 each side second set with overpressure by clinician  Seated Hooklying ball squeeze, 5 sec hold x 20  Seated marches, 2 x 10 with RTB around feet DKTC with small pball x 20  LTR x 10 with small pball  Standing ball roll 3-way x 5 to each side (diagonals only) Standing chops with 13# cable column, 2 x 10 each side  NuStep x 5 minutes       [facility] Adult PT Treatment:                                                DATE: 10/01/2022  Therapeutic Exercise: SLR 2 x 12 LTR 2 x 10, second set with overpressure by clinician  Seated Hooklying ball squeeze, 5 sec hold x 20  Seated marches, 2 x 10 with RTB around feet Seated Pallof press with green TB x 10, followed by 10 with varied resistance and direction of pull by clinician  Standing ball roll 3-way x 5 to each side (flexion exacerbated symptoms, she had decreased symptoms with diagonal roll)  Standing chops with 10# cable column x 15 each side   Modalities:  Moist Heat pack x 5 minutes, followed by 6 min concurrent with seated marches and pallof press      PATIENT EDUCATION:  Education details: evaluation findings, POC, goals, HEP with proper form.  Person educated: Patient Education method: Explanation, Verbal cues, and Handouts Education comprehension: verbalized understanding  HOME EXERCISE PROGRAM: Access Code: [email or web address] URL: [email or web address] Date: 09/15/2022 Prepared by: Lulu Riding  Exercises - Supine Posterior Pelvic Tilt  - 1 x daily - 7 x weekly - 2 sets - 10 reps - Lower Trunk Rotation  - 1 x daily - 7 x weekly - 2 sets - 10 reps - Seated Flexion Stretch  - 3 x daily - 7 x weekly - 1-2 sets - 1-2 reps - 30 - 60 sec hold - Child's Pose Stretch  - 3 x daily - 7 x weekly - 1-2 sets - 1-2 reps - 30- 60 sec hold - Seated Hamstring Stretch  - 3 x daily - 7 x weekly - 2 sets - 2 reps - 30 hold - Supine Hamstring Stretch with Strap  - 1 x daily - 7 x  weekly - 2 sets - 2 reps - 30 hold - SLR  - 1 x daily - 7 x weekly - 3 sets -  10-12 reps - 1 hold  ASSESSMENT:  CLINICAL IMPRESSION:  Melasia was able to tolerate some exercises today, however she was limited due to pain exacerbation. Her symptoms responded well with moist heat application to lumbar spine at end of session. She continues to demonstrate poor core endurance. Plan to reassess objective measures and discuss patient perceived functional ability at next visit.     OBJECTIVE IMPAIRMENTS: decreased activity tolerance, decreased endurance, decreased ROM, improper body mechanics, postural dysfunction, obesity, and pain.   ACTIVITY LIMITATIONS: standing, squatting, stairs, and locomotion level  PARTICIPATION LIMITATIONS: shopping, community activity, occupation, and yard work  PERSONAL FACTORS: Time since onset of injury/illness/exacerbation are also affecting patient's functional outcome.   REHAB POTENTIAL: Good  CLINICAL DECISION MAKING: Stable/uncomplicated  EVALUATION COMPLEXITY: Low   GOALS: Goals reviewed with patient? Yes  SHORT TERM GOALS: Target date: 10/13/2022   Pt to be IND with initial HEP for therapeutic progression Baseline: no previous HEP Goal status: INITIAL  2.  Pt to be able to verbalize/ demo efficient posture and lifting mechanics to reduce and prevent low back pain Baseline:  limited knowledge of posture Goal status: INITIAL  LONG TERM GOALS: Target date: 11/10/2022   Maintain functional trunk mobility reporting </= 2/10 max pain at end ranges for improvement in function. Baseline:  max pain is 7/10 Goal status: INITIAL  2.  Pt to be able to stand/ walk for >/= 1 hour for endurance required for ADLs and work related tasks with </= 2/10 pain Baseline: pain started with standing for 3-5 min and brisk walking Goal status: INITIAL  3.  Pt will be able to go up/ down >/= 2 flights of steps reporting </= min difficulty for improvement in  mobility and condition.  Baseline:  Extreme difficulty at eval Goal status: INITIAL  4.  Pt to improve FOTO score to >/= 54% to demo improvement in function Baseline: initial score 40 Goal status: INITIAL  5.  Pt to be IND with all HEP and is able to maintain and progress their current LOF IND. Baseline: No previous HEP Goal status: INITIAL  PLAN:  PT FREQUENCY: 1-2x/week  PT DURATION: 8 weeks  PLANNED INTERVENTIONS: Therapeutic exercises, Therapeutic activity, Neuromuscular re-education, Balance training, Gait training, Patient/Family education, Self Care, Joint mobilization, Joint manipulation, Aquatic Therapy, Dry Needling, Cryotherapy, Moist heat, Taping, Manual therapy, and Re-evaluation.  PLAN FOR NEXT SESSION: Review/ update HEP PRN. Potential L SIJ involvement - hamstring stretching, L hip flexor activation / MET, core strengthening. Posture education.    Mauri Reading, PT, DPT  10/22/22  6:51 PM

## 2022-10-30 ENCOUNTER — Ambulatory Visit: Payer: Medicaid Other

## 2022-10-30 DIAGNOSIS — R293 Abnormal posture: Secondary | ICD-10-CM

## 2022-10-30 DIAGNOSIS — M62830 Muscle spasm of back: Secondary | ICD-10-CM

## 2022-10-30 DIAGNOSIS — M5459 Other low back pain: Secondary | ICD-10-CM

## 2022-10-30 NOTE — Therapy (Signed)
OUTPATIENT PHYSICAL THERAPY TREATMENT NOTE   Patient Name: CATARINA HUNTLEY MRN: [id number] DOB:1990-06-26, 32 y.o., female Today's Date: 10/30/2022  END OF SESSION:  PT End of Session - 10/30/22 1712     Visit Number 7    Number of Visits 9    Date for PT Re-Evaluation 11/10/22    Authorization Type [employer]    PT Start Time 1710    PT Stop Time 1745    PT Time Calculation (min) 35 min    Activity Tolerance Patient tolerated treatment well    Behavior During Therapy WFL for tasks assessed/performed                   Past Medical History:  Diagnosis Date   Chlamydia    Hypertension    Infection    UTI   Morbid obesity (HCC)    Pulmonary embolism (HCC)    Urinary tract infection    Vaginal Pap smear, abnormal    f/u was ok   Past Surgical History:  Procedure Laterality Date   CESAREAN SECTION     COLPOSCOPY     DILATION AND EVACUATION N/A 10/04/2012   Procedure: DILATATION AND EVACUATION;  Surgeon: Lesly Dukes, MD;  Location: [facility];  Service: Gynecology;  Laterality: N/A;   LEEP  11/2016   Patient Active Problem List   Diagnosis Date Noted   Chest pain 08/16/2020   Pneumonia due to COVID-19 virus 04/23/2020   History of COVID-19 04/23/2020   Tachycardia 04/23/2020   COVID-19 virus infection 04/02/2020   Pulmonary embolism (HCC) 04/02/2020   Morbid obesity (HCC)    Abnormal Pap smear of vagina 01/31/2016    PCP: Barbette Merino, NP  REFERRING PROVIDER: Low back pain  REFERRING DIAG: Marcene Corning, MD   Rationale for Evaluation and Treatment: Rehabilitation  THERAPY DIAG:  Other low back pain  Muscle spasm of back  Abnormal posture  ONSET DATE: October 2023  SUBJECTIVE:                                                                                                                                                                                           SUBJECTIVE STATEMENT: Patient reporting 6/10 back pain today and  feels that she has to walk sideways because of her back pain.  PERTINENT HISTORY:  Obesity  PAIN:  Are you having pain? Yes: NPRS scale: 5/10, At worst 7/10, at best 1/10 Pain location: cramp, heavy tightness, constant   Pain description: low back with referred to the L/R Aggravating factors: prolonged standing, prolong walking, sitting up straight Relieving factors: muscle relaxer, ibuprofen,  heating  PRECAUTIONS: None  WEIGHT BEARING RESTRICTIONS: No  FALLS:  Has patient fallen in last 6 months? 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   OBJECTIVE:   DIAGNOSTIC FINDINGS:  X-ray 06/26/22 Lumbar spine  IMPRESSION: Negative.  PATIENT SURVEYS:  FOTO 40% and predicted 54%  SCREENING FOR RED FLAGS: Bowel or bladder incontinence: No Cauda equina syndrome: No  COGNITION: Overall cognitive status: Within functional limits for tasks assessed     SENSATION: WFL   POSTURE: rounded shoulders and forward head  PALPATION: TTP along bil lumbar paraspinals and QL with L>R, and pain with palpation at the L SIJ/ fortins area.  LUMBAR ROM:   AROM eval  Flexion 70 ERP  Extension 20 ERP  Right lateral flexion 20  Left lateral flexion 20  Right rotation   Left rotation    (Blank rows = not tested)  LOWER EXTREMITY ROM:     Active  Right eval Left eval  Hip flexion    Hip extension    Hip abduction    Hip adduction    Hip internal rotation    Hip external rotation    Knee flexion    Knee extension    Ankle dorsiflexion    Ankle plantarflexion    Ankle inversion    Ankle eversion     (Blank rows = not tested)  LOWER EXTREMITY  MMT:    MMT Right eval Left eval  Hip flexion 4+ 4+  Hip extension 4+ 4+  Hip abduction 4+ 4 *  Hip adduction 4+ 4+  Hip internal rotation    Hip external rotation    Knee flexion 5 4+ *  Knee extension 5 4+  Ankle dorsiflexion    Ankle plantarflexion    Ankle inversion    Ankle eversion     (Blank rows = not tested) (* = concordant pain)  LUMBAR SPECIAL TESTS:  Quadrant test: Positive with trunk lean with ext/ L sidebend, SI Compression/distraction test: Postive, and forward flexion assessment reducted L SIJ superior movement  GAIT: Distance walked: 80 ft to treatment room Assistive device utilized: None Level of assistance: Complete Independence Comments: unremarkable.   TODAY'S TREATMENT:         [facility] Adult PT Treatment:                                                DATE: 10/30/2022  Therapeutic Exercise: NuStep x 10 minutes concurrent with moist heat pack  Supine ball squeeze, 3 sec hold x 15  SLR with CL pilates ring press down x 10 each  LTR, 3 sec hold each side x 15 with red pball to the right only d/t symptom aggravation with L LTR DKTC with small pball 2 x15  Moist Heat Pack:  Lumbar moist heat pack concurrent with NuStep   [facility] Adult PT Treatment:  DATE: 10/22/2022  Therapeutic Exercise: NuStep x 5 minutes  SLR 2 x 10 LTR x 10 with red pball DKTC with small pball x 20  Seated Hooklying ball squeeze, 5 sec hold x 20  Supine 90-90 (up up down down) x 15 with one rest break  Standing ball roll 3-way x 5 to each side (diagonals only) Seated abd iso with pball press downs, concurrent with MHP x 2 min, 3 sec hold x several   Moist Heat Pack:  Seated with lumbar heat pack x 12 min  [facility] Adult PT Treatment:                                                DATE: 10/15/2022  Therapeutic Exercise: SLR 2 x 15 LTR x 10 each side second set with overpressure by clinician  Seated Hooklying ball squeeze, 5 sec hold x  20  Supine 90-90   (up up down down), 2 x 5 with RTB around feet DKTC with small pball x 20  LTR x 15 with small pball  Standing ball roll 3-way x 5 to each side (diagonals only) NuStep x 5 minutes  Discontinue standing chops at this time Standing hip flexion, abduction, 2 x 10 each bilaterally at counter, cueing for abdominal bracing    [facility] Adult PT Treatment:                                                DATE: 10/08/2022  Therapeutic Exercise: SLR 2 x 15 LTR x 10 each side second set with overpressure by clinician  Seated Hooklying ball squeeze, 5 sec hold x 20  Seated marches, 2 x 10 with RTB around feet DKTC with small pball x 20  LTR x 10 with small pball  Standing ball roll 3-way x 5 to each side (diagonals only) Standing chops with 13# cable column, 2 x 10 each side  NuStep x 5 minutes       [facility] Adult PT Treatment:                                                DATE: 10/01/2022  Therapeutic Exercise: SLR 2 x 12 LTR 2 x 10, second set with overpressure by clinician  Seated Hooklying ball squeeze, 5 sec hold x 20  Seated marches, 2 x 10 with RTB around feet Seated Pallof press with green TB x 10, followed by 10 with varied resistance and direction of pull by clinician  Standing ball roll 3-way x 5 to each side (flexion exacerbated symptoms, she had decreased symptoms with diagonal roll)  Standing chops with 10# cable column x 15 each side   Modalities:  Moist Heat pack x 5 minutes, followed by 6 min concurrent with seated marches and pallof press      PATIENT EDUCATION:  Education details: evaluation findings, POC, goals, HEP with proper form.  Person educated: Patient Education method: Explanation, Verbal cues, and Handouts Education comprehension: verbalized understanding  HOME EXERCISE PROGRAM: Access Code: [email or web address] URL: [email or web address] Date: 09/15/2022 Prepared by: Lulu Riding  Exercises - Supine Posterior Pelvic  Tilt  - 1 x  daily - 7 x weekly - 2 sets - 10 reps - Lower Trunk Rotation  - 1 x daily - 7 x weekly - 2 sets - 10 reps - Seated Flexion Stretch  - 3 x daily - 7 x weekly - 1-2 sets - 1-2 reps - 30 - 60 sec hold - Child's Pose Stretch  - 3 x daily - 7 x weekly - 1-2 sets - 1-2 reps - 30- 60 sec hold - Seated Hamstring Stretch  - 3 x daily - 7 x weekly - 2 sets - 2 reps - 30 hold - Supine Hamstring Stretch with Strap  - 1 x daily - 7 x weekly - 2 sets - 2 reps - 30 hold - SLR  - 1 x daily - 7 x weekly - 3 sets - 10-12 reps - 1 hold  ASSESSMENT:  CLINICAL IMPRESSION:  Kita continues to be limited by pain severity, but responded well to concurrent use of MHP and nustep at beginning of session. She also responded well to addition of sidelying trunk rotations at end of session. Plan to progress exercise as tolerated in order to progress towards established goals.      OBJECTIVE IMPAIRMENTS: decreased activity tolerance, decreased endurance, decreased ROM, improper body mechanics, postural dysfunction, obesity, and pain.   ACTIVITY LIMITATIONS: standing, squatting, stairs, and locomotion level  PARTICIPATION LIMITATIONS: shopping, community activity, occupation, and yard work  PERSONAL FACTORS: Time since onset of injury/illness/exacerbation are also affecting patient's functional outcome.   REHAB POTENTIAL: Good  CLINICAL DECISION MAKING: Stable/uncomplicated  EVALUATION COMPLEXITY: Low   GOALS: Goals reviewed with patient? Yes  SHORT TERM GOALS: Target date: 10/13/2022   Pt to be IND with initial HEP for therapeutic progression Baseline: no previous HEP Goal status: INITIAL  2.  Pt to be able to verbalize/ demo efficient posture and lifting mechanics to reduce and prevent low back pain Baseline:  limited knowledge of posture Goal status: INITIAL  LONG TERM GOALS: Target date: 11/10/2022   Maintain functional trunk mobility reporting </= 2/10 max pain at end ranges for improvement in  function. Baseline:  max pain is 7/10 Goal status: INITIAL  2.  Pt to be able to stand/ walk for >/= 1 hour for endurance required for ADLs and work related tasks with </= 2/10 pain Baseline: pain started with standing for 3-5 min and brisk walking Goal status: INITIAL  3.  Pt will be able to go up/ down >/= 2 flights of steps reporting </= min difficulty for improvement in mobility and condition.  Baseline:  Extreme difficulty at eval Goal status: INITIAL  4.  Pt to improve FOTO score to >/= 54% to demo improvement in function Baseline: initial score 40 Goal status: INITIAL  5.  Pt to be IND with all HEP and is able to maintain and progress their current LOF IND. Baseline: No previous HEP Goal status: INITIAL  PLAN:  PT FREQUENCY: 1-2x/week  PT DURATION: 8 weeks  PLANNED INTERVENTIONS: Therapeutic exercises, Therapeutic activity, Neuromuscular re-education, Balance training, Gait training, Patient/Family education, Self Care, Joint mobilization, Joint manipulation, Aquatic Therapy, Dry Needling, Cryotherapy, Moist heat, Taping, Manual therapy, and Re-evaluation.  PLAN FOR NEXT SESSION: Review/ update HEP PRN. Potential L SIJ involvement - hamstring stretching, L hip flexor activation / MET, core strengthening. Posture education.    Mauri Reading, PT, DPT  10/30/22  5:47 PM

## 2022-11-05 ENCOUNTER — Ambulatory Visit: Payer: Medicaid Other | Attending: Orthopaedic Surgery

## 2022-11-05 DIAGNOSIS — R293 Abnormal posture: Secondary | ICD-10-CM | POA: Insufficient documentation

## 2022-11-05 DIAGNOSIS — M62830 Muscle spasm of back: Secondary | ICD-10-CM | POA: Insufficient documentation

## 2022-11-05 DIAGNOSIS — M5459 Other low back pain: Secondary | ICD-10-CM | POA: Insufficient documentation

## 2022-11-12 ENCOUNTER — Ambulatory Visit: Payer: Medicaid Other

## 2022-11-12 DIAGNOSIS — M5459 Other low back pain: Secondary | ICD-10-CM

## 2022-11-12 DIAGNOSIS — M62830 Muscle spasm of back: Secondary | ICD-10-CM

## 2022-11-12 DIAGNOSIS — R293 Abnormal posture: Secondary | ICD-10-CM | POA: Diagnosis present

## 2022-11-12 NOTE — Therapy (Signed)
OUTPATIENT PHYSICAL THERAPY TREATMENT NOTE   Patient Name: Faith [name] MRN: [id number] DOB:1991-03-10, 32 y.o., female Today's Date: 11/12/2022  END OF SESSION:  PT End of Session - 11/12/22 1747     Visit Number 8    Number of Visits 15    Date for PT Re-Evaluation 11/10/22    Authorization Type [employer]    Authorization Time Period 09/22/22-11/20/22    Authorization - Visit Number 8    Authorization - Number of Visits 9    Progress Note Due on Visit 15    PT Start Time 1745    PT Stop Time 1830    PT Time Calculation (min) 45 min    Activity Tolerance Patient tolerated treatment well;Patient limited by pain    Behavior During Therapy WFL for tasks assessed/performed                    Past Medical History:  Diagnosis Date   Chlamydia    Hypertension    Infection    UTI   Morbid obesity (HCC)    Pulmonary embolism (HCC)    Urinary tract infection    Vaginal Pap smear, abnormal    f/u was ok   Past Surgical History:  Procedure Laterality Date   CESAREAN SECTION     COLPOSCOPY     DILATION AND EVACUATION N/A 10/04/2012   Procedure: DILATATION AND EVACUATION;  Surgeon: Lesly Dukes, MD;  Location: [facility];  Service: Gynecology;  Laterality: N/A;   LEEP  11/2016   Patient Active Problem List   Diagnosis Date Noted   Chest pain 08/16/2020   Pneumonia due to COVID-19 virus 04/23/2020   History of COVID-19 04/23/2020   Tachycardia 04/23/2020   COVID-19 virus infection 04/02/2020   Pulmonary embolism (HCC) 04/02/2020   Morbid obesity (HCC)    Abnormal Pap smear of vagina 01/31/2016    PCP: Barbette Merino, NP  REFERRING PROVIDER: Low back pain  REFERRING DIAG: Marcene Corning, MD   Rationale for Evaluation and Treatment: Rehabilitation  THERAPY DIAG:  Other low back pain  Muscle spasm of back  Abnormal posture  ONSET DATE: October 2023  SUBJECTIVE:                                                                                                                                                                                            SUBJECTIVE STATEMENT: Patient reporting 6/10 back pain today and was doing some sweeping earlier today.  She reports feeling that physical therapy has helped with her pain frequency and activity tolerance.  She would like to continue  with physical therapy to make further progress.  She was hoping to get injections, which was denied by insurance.  However she plans to follow-up with her physician in order to decrease her pain and maximize PT outcomes.  PERTINENT HISTORY:  Obesity  PAIN:  Are you having pain? Yes: NPRS scale: 5/10, At worst 7/10, at best 1/10 Pain location: cramp, heavy tightness, constant   Pain description: low back with referred to the L/R Aggravating factors: prolonged standing, prolong walking, sitting up straight Relieving factors: muscle relaxer, ibuprofen, heating  PRECAUTIONS: None  WEIGHT BEARING RESTRICTIONS: No  FALLS:  Has patient fallen in last 6 months? 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   OBJECTIVE:   DIAGNOSTIC FINDINGS:  X-ray 06/26/22 Lumbar spine  IMPRESSION: Negative.  PATIENT SURVEYS:  FOTO 40% and predicted 54%  SCREENING FOR RED FLAGS: Bowel or bladder incontinence: No Cauda equina syndrome: No  COGNITION: Overall cognitive status: Within functional limits for tasks assessed     SENSATION: WFL   POSTURE: rounded shoulders and forward head  PALPATION: TTP along bil lumbar paraspinals and QL with L>R, and pain with palpation at the L SIJ/ fortins area.  LUMBAR ROM:   AROM eval  11/12/22  Flexion 70 ERP 85  Extension 20 ERP 40  Right lateral flexion 20 50  Left lateral flexion 20 50  Right rotation    Left rotation     (Blank rows = not tested)  LOWER EXTREMITY ROM:     Active  Right eval Left eval  Hip flexion    Hip extension    Hip abduction    Hip adduction    Hip internal rotation    Hip external rotation    Knee flexion    Knee extension    Ankle dorsiflexion    Ankle plantarflexion    Ankle inversion    Ankle eversion     (Blank rows = not tested)  LOWER EXTREMITY MMT:    MMT Right eval Left eval Right 11/12/22 Left 11/12/22  Hip flexion 4+ 4+ 4+ 5  Hip extension 4+ 4+    Hip abduction 4+ 4 * 5 5  Hip adduction 4+ 4+ 5 5  Hip internal rotation      Hip external rotation      Knee flexion 5 4+ * 5 5  Knee extension 5 4+ 5 5  Ankle dorsiflexion      Ankle plantarflexion      Ankle inversion      Ankle eversion       (Blank rows = not tested) (* = concordant pain)  LUMBAR SPECIAL TESTS:  Quadrant test: Positive with trunk lean with ext/ L sidebend, SI Compression/distraction test: Postive, and forward flexion assessment reducted L SIJ superior movement  GAIT: Distance walked: 80 ft to treatment room Assistive device utilized: None Level of assistance: Complete Independence Comments: unremarkable.   TODAY'S TREATMENT:         [facility] Adult PT Treatment:  DATE: 11/12/2022  Therapeutic Exercise: NuStep x 10 minutes concurrent with moist heat pack  Supine ball squeeze, 5 sec hold x 20  SLR with CL pilates ring press down x 10 each  Rt. LTR, 3 sec hold each side x 15  DKTC with small pball 2 x 10  Moist Heat Pack:  Lumbar moist heat pack concurrent with NuStep   Therapeutic Activity:  Reassessment of objective measures and subjective assessment regarding progress towards established goals and plan for POC extension related to remaining rehab goals   [facility] Adult PT Treatment:                                                 DATE: 10/30/2022  Therapeutic Exercise: NuStep x 10 minutes concurrent with moist heat pack  Supine ball squeeze, 3 sec hold x 15  SLR with CL pilates ring press down x 10 each  LTR, 3 sec hold each side x 15 with red pball to the right only d/t symptom aggravation with L LTR DKTC with small pball 2 x15  Moist Heat Pack:  Lumbar moist heat pack concurrent with NuStep   [facility] Adult PT Treatment:                                                DATE: 10/22/2022  Therapeutic Exercise: NuStep x 5 minutes  SLR 2 x 10 LTR x 10 with red pball DKTC with small pball x 20  Seated Hooklying ball squeeze, 5 sec hold x 20  Supine 90-90 (up up down down) x 15 with one rest break  Standing ball roll 3-way x 5 to each side (diagonals only) Seated abd iso with pball press downs, concurrent with MHP x 2 min, 3 sec hold x several   Moist Heat Pack:  Seated with lumbar heat pack x 12 min    PATIENT EDUCATION:  Education details: evaluation findings, POC, goals, HEP with proper form.  Person educated: Patient Education method: Explanation, Verbal cues, and Handouts Education comprehension: verbalized understanding  HOME EXERCISE PROGRAM: Access Code: [email or web address] URL: [email or web address] Date: 09/15/2022 Prepared by: Lulu Riding  Exercises - Supine Posterior Pelvic Tilt  - 1 x daily - 7 x weekly - 2 sets - 10 reps - Lower Trunk Rotation  - 1 x daily - 7 x weekly - 2 sets - 10 reps - Seated Flexion Stretch  - 3 x daily - 7 x weekly - 1-2 sets - 1-2 reps - 30 - 60 sec hold - Child's Pose Stretch  - 3 x daily - 7 x weekly - 1-2 sets - 1-2 reps - 30- 60 sec hold - Seated Hamstring Stretch  - 3 x daily - 7 x weekly - 2 sets - 2 reps - 30 hold - Supine Hamstring Stretch with Strap  - 1 x daily - 7 x weekly - 2 sets - 2 reps - 30 hold - SLR  - 1 x daily - 7 x weekly - 3 sets - 10-12 reps - 1 hold  ASSESSMENT:  CLINICAL  IMPRESSION:  Patient has attended 8 total physical therapy sessions and is demonstrating improved lumbar spine AROM, bilateral lower extremity strength, and reports decreased frequency of severe pain  as well as some improvements with daily activities.  However, she continues to be limited by moderate levels of pain with daily activities including household chores and occupational tasks.  Patient will benefit from ongoing skilled physical therapy in order to address remaining deficits and return to prior level of function.     OBJECTIVE IMPAIRMENTS: decreased activity tolerance, decreased endurance, decreased ROM, improper body mechanics, postural dysfunction, obesity, and pain.   ACTIVITY LIMITATIONS: standing, squatting, stairs, and locomotion level  PARTICIPATION LIMITATIONS: shopping, community activity, occupation, and yard work  PERSONAL FACTORS: Time since onset of injury/illness/exacerbation are also affecting patient's functional outcome.   REHAB POTENTIAL: Good  CLINICAL DECISION MAKING: Stable/uncomplicated  EVALUATION COMPLEXITY: Low   GOALS: Goals reviewed with patient? Yes  SHORT TERM GOALS: Target date: 10/13/2022   Pt to be IND with initial HEP for therapeutic progression Baseline: no previous HEP Goal status: MET  2.  Pt to be able to verbalize/ demo efficient posture and lifting mechanics to reduce and prevent low back pain Baseline:  limited knowledge of posture Goal status: ONGOING  LONG TERM GOALS: Target date: 12/24/2022, updated 11/12/22   Maintain functional trunk mobility reporting </= 2/10 max pain at end ranges for improvement in function. Baseline:  max pain is 7/10 Goal status: MET 11/12/22  2.  Pt to be able to stand/ walk for >/= 1 hour for endurance required for ADLs and work related tasks with </= 2/10 pain Baseline: pain started with standing for 3-5 min and brisk walking 11/12/22: unable to stand >5-10 minutes without medication Goal status:  ONGOING  3.  Pt will be able to go up/ down >/= 2 flights of steps reporting </= min difficulty for improvement in mobility and condition.  Baseline:  Extreme difficulty at eval 11/12/22: Moderate difficulty  Goal status: ONGOING  4.  Pt to improve FOTO score to >/= 54% to demo improvement in function Baseline: initial score 40 11/12/22: 36% Goal status: ONGOING  5.  Pt to be IND with all HEP and is able to maintain and progress their current LOF IND. Baseline: No previous HEP Goal status: MET  PLAN:  PT FREQUENCY: 1-2x/week  PT DURATION: 6 additional weeks, as of 11/12/22  PLANNED INTERVENTIONS: Therapeutic exercises, Therapeutic activity, Neuromuscular re-education, Balance training, Gait training, Patient/Family education, Self Care, Joint mobilization, Joint manipulation, Aquatic Therapy, Dry Needling, Cryotherapy, Moist heat, Taping, Manual therapy, and Re-evaluation.  PLAN FOR NEXT SESSION: Continue with core strengthening, lumbar spine mobility, pain modulation/neuro regulation activities, progression towards functional strengthening  Mauri Reading, PT, DPT  11/12/22  7:12 PM

## 2022-11-19 ENCOUNTER — Ambulatory Visit: Payer: Medicaid Other

## 2022-11-19 DIAGNOSIS — M62830 Muscle spasm of back: Secondary | ICD-10-CM

## 2022-11-19 DIAGNOSIS — M5459 Other low back pain: Secondary | ICD-10-CM | POA: Diagnosis not present

## 2022-11-19 NOTE — Therapy (Signed)
OUTPATIENT PHYSICAL THERAPY TREATMENT NOTE   Patient Name: Faith [name] MRN: [id number] DOB:08-19-1990, 32 y.o., female Today's Date: 11/19/2022  END OF SESSION:  PT End of Session - 11/19/22 1748     Visit Number 9    Number of Visits 15    Authorization Type [employer]    Authorization Time Period 09/22/22-11/20/22    Authorization - Visit Number 9    Authorization - Number of Visits 9    PT Start Time 1745    PT Stop Time 1817    PT Time Calculation (min) 32 min    Activity Tolerance Patient tolerated treatment well;Patient limited by pain    Behavior During Therapy WFL for tasks assessed/performed                     Past Medical History:  Diagnosis Date   Chlamydia    Hypertension    Infection    UTI   Morbid obesity (HCC)    Pulmonary embolism (HCC)    Urinary tract infection    Vaginal Pap smear, abnormal    f/u was ok   Past Surgical History:  Procedure Laterality Date   CESAREAN SECTION     COLPOSCOPY     DILATION AND EVACUATION N/A 10/04/2012   Procedure: DILATATION AND EVACUATION;  Surgeon: Lesly Dukes, MD;  Location: [facility];  Service: Gynecology;  Laterality: N/A;   LEEP  11/2016   Patient Active Problem List   Diagnosis Date Noted   Chest pain 08/16/2020   Pneumonia due to COVID-19 virus 04/23/2020   History of COVID-19 04/23/2020   Tachycardia 04/23/2020   COVID-19 virus infection 04/02/2020   Pulmonary embolism (HCC) 04/02/2020   Morbid obesity (HCC)    Abnormal Pap smear of vagina 01/31/2016    PCP: Barbette Merino, NP  REFERRING PROVIDER: Low back pain  REFERRING DIAG: Marcene Corning, MD   Rationale for Evaluation and Treatment: Rehabilitation  THERAPY DIAG:  Other low back pain  Muscle spasm of back  ONSET DATE: October 2023  SUBJECTIVE:                                                                                                                                                                                            SUBJECTIVE STATEMENT: Patient reporting 5/10 pain today. States that she saw her orthopedic today who wanted her to continue with skilled PT while waiting for the insurance appeal regarding planned injection.    PERTINENT HISTORY:  Obesity  PAIN:  Are you having pain? Yes: NPRS scale: 5/10, At worst 7/10, at best 1/10 Pain  location: cramp, heavy tightness, constant   Pain description: low back with referred to the L/R Aggravating factors: prolonged standing, prolong walking, sitting up straight Relieving factors: muscle relaxer, ibuprofen, heating  PRECAUTIONS: None  WEIGHT BEARING RESTRICTIONS: No  FALLS:  Has patient fallen in last 6 months? 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   OBJECTIVE:   DIAGNOSTIC FINDINGS:  X-ray 06/26/22 Lumbar spine  IMPRESSION: Negative.  PATIENT SURVEYS:  FOTO 40% and predicted 54%  SCREENING FOR RED FLAGS: Bowel or bladder incontinence: No Cauda equina syndrome: No  COGNITION: Overall cognitive status: Within functional limits for tasks assessed     SENSATION: WFL   POSTURE: rounded shoulders and forward head  PALPATION: TTP along bil lumbar paraspinals and QL with L>R, and pain with palpation at the L SIJ/ fortins area.  LUMBAR ROM:   AROM eval 11/12/22  Flexion 70 ERP 85  Extension 20 ERP 40  Right lateral flexion 20 50  Left lateral flexion 20 50  Right rotation    Left rotation     (Blank rows = not tested)  LOWER EXTREMITY ROM:     Active  Right eval Left eval  Hip flexion    Hip extension    Hip abduction    Hip adduction    Hip internal rotation     Hip external rotation    Knee flexion    Knee extension    Ankle dorsiflexion    Ankle plantarflexion    Ankle inversion    Ankle eversion     (Blank rows = not tested)  LOWER EXTREMITY MMT:    MMT Right eval Left eval Right 11/12/22 Left 11/12/22  Hip flexion 4+ 4+ 4+ 5  Hip extension 4+ 4+    Hip abduction 4+ 4 * 5 5  Hip adduction 4+ 4+ 5 5  Hip internal rotation      Hip external rotation      Knee flexion 5 4+ * 5 5  Knee extension 5 4+ 5 5  Ankle dorsiflexion      Ankle plantarflexion      Ankle inversion      Ankle eversion       (Blank rows = not tested) (* = concordant pain)  LUMBAR SPECIAL TESTS:  Quadrant test: Positive with trunk lean with ext/ L sidebend, SI Compression/distraction test: Postive, and forward flexion assessment reducted L SIJ superior movement  GAIT: Distance walked: 80 ft to treatment room Assistive device utilized: None Level of assistance: Complete Independence Comments: unremarkable.    TODAY'S TREATMENT:         [facility] Adult PT Treatment:                                                DATE: 11/19/2022  Therapeutic Exercise: NuStep x 10 minutes  Supine ball squeeze, 5 sec hold x 20  SLR with CL pilates ring press down, 2 x 10 each  Rt. LTR, 3 sec hold each side x 15  DKTC with small pball 2 x  10 Supine 90-90 (up up down down) x 15 with one rest break     [facility] Adult PT Treatment:                                                DATE: 11/12/2022  Therapeutic Exercise: NuStep x 10 minutes concurrent with moist heat pack  Supine ball squeeze, 5 sec hold x 20  SLR with CL pilates ring press down x 10 each  Rt. LTR, 3 sec hold each side x 15  DKTC with small pball 2 x 10  Moist Heat Pack:  Lumbar moist heat pack concurrent with NuStep   Therapeutic Activity:  Reassessment of objective measures and subjective assessment regarding progress towards established goals and plan for POC extension related to remaining rehab  goals   [facility] Adult PT Treatment:                                                DATE: 10/30/2022  Therapeutic Exercise: NuStep x 10 minutes concurrent with moist heat pack  Supine ball squeeze, 3 sec hold x 15  SLR with CL pilates ring press down x 10 each  LTR, 3 sec hold each side x 15 with red pball to the right only d/t symptom aggravation with L LTR DKTC with small pball 2 x15 Supine 90-90 (up up down down) x 15 with one rest break   Moist Heat Pack:  Lumbar moist heat pack concurrent with NuStep   [facility] Adult PT Treatment:                                                DATE: 10/22/2022  Therapeutic Exercise: NuStep x 5 minutes  SLR 2 x 10 LTR x 10 with red pball DKTC with small pball x 20  Seated Hooklying ball squeeze, 5 sec hold x 20  Supine 90-90 (up up down down) x 15 with one rest break  Standing ball roll 3-way x 5 to each side (diagonals only) Seated abd iso with pball press downs, concurrent with MHP x 2 min, 3 sec hold x several   Moist Heat Pack:  Seated with lumbar heat pack x 12 min    PATIENT EDUCATION:  Education details: evaluation findings, POC, goals, HEP with proper form.  Person educated: Patient Education method: Explanation, Verbal cues, and Handouts Education comprehension: verbalized understanding  HOME EXERCISE PROGRAM: Access Code: [email or web address] URL: [email or web address] Date: 09/15/2022 Prepared by: Lulu Riding  Exercises - Supine Posterior Pelvic Tilt  - 1 x daily - 7 x weekly - 2 sets - 10 reps - Lower Trunk Rotation  - 1 x daily - 7 x weekly - 2 sets - 10 reps - Seated Flexion Stretch  - 3 x daily - 7 x weekly - 1-2 sets - 1-2 reps - 30 - 60 sec hold - Child's Pose Stretch  - 3 x daily - 7 x weekly - 1-2 sets - 1-2 reps - 30- 60 sec hold - Seated Hamstring Stretch  - 3 x daily - 7 x weekly - 2 sets - 2  reps - 30 hold - Supine Hamstring Stretch with Strap  - 1 x daily - 7 x weekly - 2 sets - 2 reps - 30 hold - SLR  - 1  x daily - 7 x weekly - 3 sets - 10-12 reps - 1 hold  ASSESSMENT:  CLINICAL IMPRESSION:  Patient has attended 9 total physical therapy session and continues to demonstrate improvement in lumbar spine AROM, bilateral lower extremity strength, and decreased severity of pain.  She had improved tolerance of therapeutic activities today, and will continue to progress as tolerated.  Patient requires ongoing skilled physical therapy to address remaining deficits, including performance of household chores and occupational tasks.  As recommended by physical therapist and orthopedic physician, patient requires an additional 4 to 6 weeks of therapy to maximize rehab outcomes.      OBJECTIVE IMPAIRMENTS: decreased activity tolerance, decreased endurance, decreased ROM, improper body mechanics, postural dysfunction, obesity, and pain.   ACTIVITY LIMITATIONS: standing, squatting, stairs, and locomotion level  PARTICIPATION LIMITATIONS: shopping, community activity, occupation, and yard work  PERSONAL FACTORS: Time since onset of injury/illness/exacerbation are also affecting patient's functional outcome.   REHAB POTENTIAL: Good  CLINICAL DECISION MAKING: Stable/uncomplicated  EVALUATION COMPLEXITY: Low   GOALS: Goals reviewed with patient? Yes  SHORT TERM GOALS: Target date: 10/13/2022   Pt to be IND with initial HEP for therapeutic progression Baseline: no previous HEP Goal status: MET  2.  Pt to be able to verbalize/ demo efficient posture and lifting mechanics to reduce and prevent low back pain Baseline:  limited knowledge of posture Goal status: ONGOING  LONG TERM GOALS: Target date: 12/24/2022, updated 11/12/22   Maintain functional trunk mobility reporting </= 2/10 max pain at end ranges for improvement in function. Baseline:  max pain is 7/10 Goal status: MET 11/12/22  2.  Pt to be able to stand/ walk for >/= 1 hour for endurance required for ADLs and work related tasks with </=  2/10 pain Baseline: pain started with standing for 3-5 min and brisk walking 11/12/22: unable to stand >5-10 minutes without medication Goal status: ONGOING  3.  Pt will be able to go up/ down >/= 2 flights of steps reporting </= min difficulty for improvement in mobility and condition.  Baseline:  Extreme difficulty at eval 11/12/22: Moderate difficulty  Goal status: ONGOING  4.  Pt to improve FOTO score to >/= 54% to demo improvement in function Baseline: initial score 40 11/12/22: 36% Goal status: ONGOING  5.  Pt to be IND with all HEP and is able to maintain and progress their current LOF IND. Baseline: No previous HEP Goal status: MET  PLAN:  PT FREQUENCY: 1-2x/week  PT DURATION: 6 additional weeks, as of 11/12/22  PLANNED INTERVENTIONS: Therapeutic exercises, Therapeutic activity, Neuromuscular re-education, Balance training, Gait training, Patient/Family education, Self Care, Joint mobilization, Joint manipulation, Aquatic Therapy, Dry Needling, Cryotherapy, Moist heat, Taping, Manual therapy, and Re-evaluation.  PLAN FOR NEXT SESSION: Continue with core strengthening, lumbar spine mobility, pain modulation/neuro regulation activities, progression towards functional strengthening  Mauri Reading, PT, DPT  11/19/22  6:31 PM    Check all possible CPT codes: 16109 - PT Re-evaluation, 97110- Therapeutic Exercise, [phone]- Neuro Re-education, 97140 - Manual Therapy, 97530 - Therapeutic Activities, 97535 - Self Care, [phone] - Mechanical traction, and 97014 - Electrical stimulation (unattended)    Check all conditions that are expected to impact treatment: {Conditions expected to impact treatment:None of these apply   If treatment provided at initial evaluation,  no treatment charged due to lack of authorization.

## 2022-11-26 ENCOUNTER — Ambulatory Visit: Payer: Medicaid Other

## 2022-11-26 DIAGNOSIS — M5459 Other low back pain: Secondary | ICD-10-CM | POA: Diagnosis not present

## 2022-11-26 DIAGNOSIS — M62830 Muscle spasm of back: Secondary | ICD-10-CM

## 2022-11-26 NOTE — Therapy (Signed)
OUTPATIENT PHYSICAL THERAPY TREATMENT NOTE   Patient Name: Faith [name] MRN: [id number] DOB:[birth year], 32 y.o., female Today's Date: 11/26/2022  END OF SESSION:  PT End of Session - 11/26/22 1757     Visit Number 10    Number of Visits 15    Date for PT Re-Evaluation 11/10/22    Authorization Type [employer]    Authorization Time Period 11/26/22-01/03/23    Authorization - Visit Number 1    Authorization - Number of Visits 5    PT Start Time 1747    PT Stop Time 1825    PT Time Calculation (min) 38 min    Activity Tolerance Patient tolerated treatment well;Patient limited by pain    Behavior During Therapy WFL for tasks assessed/performed                     Past Medical History:  Diagnosis Date   Chlamydia    Hypertension    Infection    UTI   Morbid obesity (HCC)    Pulmonary embolism (HCC)    Urinary tract infection    Vaginal Pap smear, abnormal    f/u was ok   Past Surgical History:  Procedure Laterality Date   CESAREAN SECTION     COLPOSCOPY     DILATION AND EVACUATION N/A 10/04/2012   Procedure: DILATATION AND EVACUATION;  Surgeon: Lesly Dukes, MD;  Location: [facility];  Service: Gynecology;  Laterality: N/A;   LEEP  11/2016   Patient Active Problem List   Diagnosis Date Noted   Chest pain 08/16/2020   Pneumonia due to COVID-19 virus 04/23/2020   History of COVID-19 04/23/2020   Tachycardia 04/23/2020   COVID-19 virus infection 04/02/2020   Pulmonary embolism (HCC) 04/02/2020   Morbid obesity (HCC)    Abnormal Pap smear of vagina 01/31/2016    PCP: Barbette Merino, NP  REFERRING PROVIDER: Low back pain  REFERRING DIAG: Marcene Corning, MD   Rationale for Evaluation and Treatment: Rehabilitation  THERAPY DIAG:  Other low back pain  Muscle spasm of back  ONSET DATE: October 2023  SUBJECTIVE:                                                                                                                                                                                            SUBJECTIVE STATEMENT: Patient reporting 5/10 pain today. She states that she went hiking for the first time this past weekend with her S.O. and had exacerbation of pain at that time. She tried hot pack for pain management.    PERTINENT HISTORY:  Obesity  PAIN:  Are you having pain? Yes: NPRS scale: 5/10, At worst 7/10, at best 1/10 Pain location: cramp, heavy tightness, constant   Pain description: low back with referred to the L/R Aggravating factors: prolonged standing, prolong walking, sitting up straight Relieving factors: muscle relaxer, ibuprofen, heating  PRECAUTIONS: None  WEIGHT BEARING RESTRICTIONS: No  FALLS:  Has patient fallen in last 6 months? 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   OBJECTIVE:   DIAGNOSTIC FINDINGS:  X-ray 06/26/22 Lumbar spine  IMPRESSION: Negative.  PATIENT SURVEYS:  FOTO 40% and predicted 54%  SCREENING FOR RED FLAGS: Bowel or bladder incontinence: No Cauda equina syndrome: No  COGNITION: Overall cognitive status: Within functional limits for tasks assessed     SENSATION: WFL   POSTURE: rounded shoulders and forward head  PALPATION: TTP along bil lumbar paraspinals and QL with L>R, and pain with palpation at the L SIJ/ fortins area.  LUMBAR ROM:   AROM eval 11/12/22  Flexion 70 ERP 85  Extension 20 ERP 40  Right lateral flexion 20 50  Left lateral flexion 20 50  Right rotation    Left rotation     (Blank rows = not tested)  LOWER EXTREMITY ROM:     Active  Right eval Left eval  Hip flexion    Hip extension    Hip  abduction    Hip adduction    Hip internal rotation    Hip external rotation    Knee flexion    Knee extension    Ankle dorsiflexion    Ankle plantarflexion    Ankle inversion    Ankle eversion     (Blank rows = not tested)  LOWER EXTREMITY MMT:    MMT Right eval Left eval Right 11/12/22 Left 11/12/22  Hip flexion 4+ 4+ 4+ 5  Hip extension 4+ 4+    Hip abduction 4+ 4 * 5 5  Hip adduction 4+ 4+ 5 5  Hip internal rotation      Hip external rotation      Knee flexion 5 4+ * 5 5  Knee extension 5 4+ 5 5  Ankle dorsiflexion      Ankle plantarflexion      Ankle inversion      Ankle eversion       (Blank rows = not tested) (* = concordant pain)  LUMBAR SPECIAL TESTS:  Quadrant test: Positive with trunk lean with ext/ L sidebend, SI Compression/distraction test: Postive, and forward flexion assessment reducted L SIJ superior movement  GAIT: Distance walked: 80 ft to treatment room Assistive device utilized: None Level of assistance: Complete Independence Comments: unremarkable.    TODAY'S TREATMENT:         [facility] Adult PT Treatment:                                                DATE: 11/26/2022  Therapeutic Exercise: NuStep, level 4 x 5 minutes  Supine ball squeeze, 5 sec hold x 20  SLR with CL pilates ring press down, x 15 each  Lt. LTR, 3 sec hold each side with overpressure for clinician DKTC with small pball 2 x 10 Seated exercises concurrent with moist heat pack at end of session (8 minutes)  Marches with red TB, 2 x 1 min Clamshells with red TB, 2 x 1 min  Diaphragmatic breathing   [facility] Adult PT Treatment:                                                DATE: 11/19/2022  Therapeutic Exercise: NuStep x 10 minutes  Supine ball squeeze, 5 sec hold x 20  SLR with CL pilates ring press down, 2 x 10 each  Rt. LTR, 3 sec hold each side x 15  DKTC with small pball 2 x 10 Supine 90-90 (up up down down) x 15 with one rest break     [facility] Adult PT Treatment:                                                 DATE: 11/12/2022  Therapeutic Exercise: NuStep x 10 minutes concurrent with moist heat pack  Supine ball squeeze, 5 sec hold x 20  SLR with CL pilates ring press down x 10 each  Rt. LTR, 3 sec hold each side x 15  DKTC with small pball 2 x 10  Moist Heat Pack:  Lumbar moist heat pack concurrent with NuStep   Therapeutic Activity:  Reassessment of objective measures and subjective assessment regarding progress towards established goals and plan for POC extension related to remaining rehab goals      PATIENT EDUCATION:  Education details: evaluation findings, POC, goals, HEP with proper form.  Person educated: Patient Education method: Explanation, Verbal cues, and Handouts Education comprehension: verbalized understanding  HOME EXERCISE PROGRAM: Access Code: [email or web address] URL: [email or web address] Date: 09/15/2022 Prepared by: Lulu Riding  Exercises - Supine Posterior Pelvic Tilt  - 1 x daily - 7 x weekly - 2 sets - 10 reps - Lower Trunk Rotation  - 1 x daily - 7 x weekly - 2 sets - 10 reps - Seated Flexion Stretch  - 3 x daily - 7 x weekly - 1-2 sets - 1-2 reps - 30 - 60 sec hold - Child's Pose Stretch  - 3 x daily - 7 x weekly - 1-2 sets - 1-2 reps - 30- 60 sec hold - Seated Hamstring Stretch  - 3 x daily - 7 x weekly - 2 sets - 2 reps - 30 hold - Supine Hamstring Stretch with Strap  - 1 x daily - 7 x weekly - 2 sets - 2 reps - 30 hold - SLR  - 1 x daily - 7 x weekly - 3 sets - 10-12 reps - 1 hold  ASSESSMENT:  CLINICAL IMPRESSION:  Patient had decreased tolerance of core strengthening activities today d/t recent pain exacerbation. However, she was able to tolerate some seated core strengthening exercises with moist heat pack to lumbar back. Initiated diaphragmatic breathing to assist with overall pain management strategies. We will progress at next visit as tolerated.       OBJECTIVE IMPAIRMENTS: decreased  activity tolerance, decreased endurance, decreased ROM, improper body mechanics, postural dysfunction, obesity, and pain.   ACTIVITY LIMITATIONS: standing, squatting,  stairs, and locomotion level  PARTICIPATION LIMITATIONS: shopping, community activity, occupation, and yard work  PERSONAL FACTORS: Time since onset of injury/illness/exacerbation are also affecting patient's functional outcome.   REHAB POTENTIAL: Good  CLINICAL DECISION MAKING: Stable/uncomplicated  EVALUATION COMPLEXITY: Low   GOALS: Goals reviewed with patient? Yes  SHORT TERM GOALS: Target date: 10/13/2022   Pt to be IND with initial HEP for therapeutic progression Baseline: no previous HEP Goal status: MET  2.  Pt to be able to verbalize/ demo efficient posture and lifting mechanics to reduce and prevent low back pain Baseline:  limited knowledge of posture Goal status: ONGOING  LONG TERM GOALS: Target date: 12/24/2022, updated 11/12/22   Maintain functional trunk mobility reporting </= 2/10 max pain at end ranges for improvement in function. Baseline:  max pain is 7/10 Goal status: MET 11/12/22  2.  Pt to be able to stand/ walk for >/= 1 hour for endurance required for ADLs and work related tasks with </= 2/10 pain Baseline: pain started with standing for 3-5 min and brisk walking 11/12/22: unable to stand >5-10 minutes without medication Goal status: ONGOING  3.  Pt will be able to go up/ down >/= 2 flights of steps reporting </= min difficulty for improvement in mobility and condition.  Baseline:  Extreme difficulty at eval 11/12/22: Moderate difficulty  Goal status: ONGOING  4.  Pt to improve FOTO score to >/= 54% to demo improvement in function Baseline: initial score 40 11/12/22: 36% Goal status: ONGOING  5.  Pt to be IND with all HEP and is able to maintain and progress their current LOF IND. Baseline: No previous HEP Goal status: MET  PLAN:  PT FREQUENCY: 1-2x/week  PT DURATION: 6  additional weeks, as of 11/12/22  PLANNED INTERVENTIONS: Therapeutic exercises, Therapeutic activity, Neuromuscular re-education, Balance training, Gait training, Patient/Family education, Self Care, Joint mobilization, Joint manipulation, Aquatic Therapy, Dry Needling, Cryotherapy, Moist heat, Taping, Manual therapy, and Re-evaluation.  PLAN FOR NEXT SESSION: Continue with core strengthening, lumbar spine mobility, pain modulation/neuro regulation activities, progression towards functional strengthening  Mauri Reading, PT, DPT  11/26/22  6:32 PM    Check all possible CPT codes: 16109 - PT Re-evaluation, 97110- Therapeutic Exercise, [phone]- Neuro Re-education, 97140 - Manual Therapy, 97530 - Therapeutic Activities, 97535 - Self Care, [phone] - Mechanical traction, and 97014 - Electrical stimulation (unattended)    Check all conditions that are expected to impact treatment: {Conditions expected to impact treatment:None of these apply   If treatment provided at initial evaluation, no treatment charged due to lack of authorization.

## 2022-12-03 ENCOUNTER — Ambulatory Visit: Payer: Medicaid Other

## 2022-12-03 DIAGNOSIS — M5459 Other low back pain: Secondary | ICD-10-CM

## 2022-12-03 DIAGNOSIS — M62830 Muscle spasm of back: Secondary | ICD-10-CM

## 2022-12-03 NOTE — Therapy (Signed)
OUTPATIENT PHYSICAL THERAPY TREATMENT NOTE   Patient Name: Faith [name] MRN: [id number] DOB:1990/05/28, 32 y.o., female Today's Date: 12/03/2022  END OF SESSION:  PT End of Session - 12/03/22 1749     Visit Number 11    Number of Visits 15    Date for PT Re-Evaluation 11/10/22    Authorization Type [employer]    Authorization Time Period 11/26/22-01/03/23    Authorization - Visit Number 2    Authorization - Number of Visits 5    Progress Note Due on Visit 15    PT Start Time 1748    PT Stop Time 1828    PT Time Calculation (min) 40 min    Activity Tolerance Patient tolerated treatment well;Patient limited by pain    Behavior During Therapy WFL for tasks assessed/performed                      Past Medical History:  Diagnosis Date   Chlamydia    Hypertension    Infection    UTI   Morbid obesity (HCC)    Pulmonary embolism (HCC)    Urinary tract infection    Vaginal Pap smear, abnormal    f/u was ok   Past Surgical History:  Procedure Laterality Date   CESAREAN SECTION     COLPOSCOPY     DILATION AND EVACUATION N/A 10/04/2012   Procedure: DILATATION AND EVACUATION;  Surgeon: Lesly Dukes, MD;  Location: [facility];  Service: Gynecology;  Laterality: N/A;   LEEP  11/2016   Patient Active Problem List   Diagnosis Date Noted   Chest pain 08/16/2020   Pneumonia due to COVID-19 virus 04/23/2020   History of COVID-19 04/23/2020   Tachycardia 04/23/2020   COVID-19 virus infection 04/02/2020   Pulmonary embolism (HCC) 04/02/2020   Morbid obesity (HCC)    Abnormal Pap smear of vagina 01/31/2016    PCP: Barbette Merino, NP  REFERRING PROVIDER: Low back pain  REFERRING DIAG: Marcene Corning, MD   Rationale for Evaluation and Treatment: Rehabilitation  THERAPY DIAG:  Other low back pain  Muscle spasm of back  ONSET DATE: October 2023  SUBJECTIVE:                                                                                                                                                                                            SUBJECTIVE STATEMENT:  Patient is reporting 4/10 pain at start of session. 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   PERTINENT HISTORY:  Obesity  PAIN:  Are you having pain? Yes: NPRS scale: 5/10, At worst 7/10, at best 1/10 Pain location: cramp, heavy tightness, constant   Pain description: low back with referred to the L/R Aggravating factors: prolonged standing, prolong walking, sitting up straight Relieving factors: muscle relaxer, ibuprofen, heating  PRECAUTIONS: None  WEIGHT BEARING RESTRICTIONS: No  FALLS:  Has patient fallen in last 6 months? 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   OBJECTIVE:   DIAGNOSTIC FINDINGS:  X-ray 06/26/22 Lumbar spine  IMPRESSION: Negative.  PATIENT SURVEYS:  FOTO 40% and predicted 54%  SCREENING FOR RED FLAGS: Bowel or bladder incontinence: No Cauda equina syndrome: No  COGNITION: Overall cognitive status: Within functional limits for tasks assessed     SENSATION: WFL   POSTURE: rounded shoulders and forward head  PALPATION: TTP along bil lumbar paraspinals and QL with L>R, and pain with palpation at the L SIJ/ fortins area.  LUMBAR ROM:   AROM eval 11/12/22  Flexion 70 ERP 85  Extension 20 ERP 40  Right lateral flexion 20 50  Left lateral flexion 20 50  Right rotation    Left rotation     (Blank rows = not  tested)  LOWER EXTREMITY ROM:     Active  Right eval Left eval  Hip flexion    Hip extension    Hip abduction    Hip adduction    Hip internal rotation    Hip external rotation    Knee flexion    Knee extension    Ankle dorsiflexion    Ankle plantarflexion    Ankle inversion    Ankle eversion     (Blank rows = not tested)  LOWER EXTREMITY MMT:    MMT Right eval Left eval Right 11/12/22 Left 11/12/22  Hip flexion 4+ 4+ 4+ 5  Hip extension 4+ 4+    Hip abduction 4+ 4 * 5 5  Hip adduction 4+ 4+ 5 5  Hip internal rotation      Hip external rotation      Knee flexion 5 4+ * 5 5  Knee extension 5 4+ 5 5  Ankle dorsiflexion      Ankle plantarflexion      Ankle inversion      Ankle eversion       (Blank rows = not tested) (* = concordant pain)  LUMBAR SPECIAL TESTS:  Quadrant test: Positive with trunk lean with ext/ L sidebend, SI Compression/distraction test: Postive, and forward flexion assessment reducted L SIJ superior movement  GAIT: Distance walked: 80 ft to treatment room Assistive device utilized: None Level of assistance: Complete Independence Comments: unremarkable.    TODAY'S TREATMENT:          [facility] Adult PT Treatment:                                                DATE: 12/03/2022  Therapeutic Exercise: NuStep, level 4 x 5  minutes  Supine ball squeeze, 5 sec hold x 20  SLR with CL pilates ring press down, x 15 each  Lt. LTR, 3 sec hold  x 15  DKTC with small pball x 20 Marches with green TB, x 20 SL Clamshells with green TB, x 20 each LE Standing Rows with green TB, 2 x 10  Standing pull downs with green TB, 2 x 10    PATIENT EDUCATION:  Education details: evaluation findings, POC, goals, HEP with proper form.  Person educated: Patient Education method: Explanation, Verbal cues, and Handouts Education comprehension: verbalized understanding  HOME EXERCISE PROGRAM: Access Code: [email or web address] URL: [email or web address] Date:  09/15/2022 Prepared by: Lulu Riding  Exercises - Supine Posterior Pelvic Tilt  - 1 x daily - 7 x weekly - 2 sets - 10 reps - Lower Trunk Rotation  - 1 x daily - 7 x weekly - 2 sets - 10 reps - Seated Flexion Stretch  - 3 x daily - 7 x weekly - 1-2 sets - 1-2 reps - 30 - 60 sec hold - Child's Pose Stretch  - 3 x daily - 7 x weekly - 1-2 sets - 1-2 reps - 30- 60 sec hold - Seated Hamstring Stretch  - 3 x daily - 7 x weekly - 2 sets - 2 reps - 30 hold - Supine Hamstring Stretch with Strap  - 1 x daily - 7 x weekly - 2 sets - 2 reps - 30 hold - SLR  - 1 x daily - 7 x weekly - 3 sets - 10-12 reps - 1 hold  ASSESSMENT:  CLINICAL IMPRESSION:  Sajada was able to tolerate progression of core strengthening activities today, including initial standing rows and pull downs. She was able to complete today's session without use of modalities, and without increased time on nustep for additional pain modulation effects. She did have muscle fatigue at end of session. She will benefit from ongoing progression of core strengthening exercises. Plan is to begin incorporation of lifting mechanics education and exercise progression.      OBJECTIVE IMPAIRMENTS: decreased activity tolerance, decreased endurance, decreased ROM, improper body mechanics, postural dysfunction, obesity, and pain.   ACTIVITY LIMITATIONS: standing, squatting, stairs, and locomotion level  PARTICIPATION LIMITATIONS: shopping, community activity, occupation, and yard work  PERSONAL FACTORS: Time since onset of injury/illness/exacerbation are also affecting patient's functional outcome.   REHAB POTENTIAL: Good  CLINICAL DECISION MAKING: Stable/uncomplicated  EVALUATION COMPLEXITY: Low   GOALS: Goals reviewed with patient? Yes  SHORT TERM GOALS: Target date: 10/13/2022  Pt to be IND with initial HEP for therapeutic progression Baseline: no previous HEP Goal status: MET  2.  Pt to be able to verbalize/ demo efficient  posture and lifting mechanics to reduce and prevent low back pain Baseline:  limited knowledge of posture Goal status: ONGOING   LONG TERM GOALS: Target date: 12/24/2022, updated 11/12/22   Maintain functional trunk mobility reporting </= 2/10 max pain at end ranges for improvement in function. Baseline:  max pain is 7/10 Goal status: MET 11/12/22  2.  Pt to be able to stand/ walk for >/= 1 hour for endurance required for ADLs and work related tasks with </= 2/10 pain Baseline: pain started with standing for 3-5 min and brisk walking 11/12/22: unable to stand >5-10 minutes without medication Goal status: ONGOING  3.  Pt will be able to go up/ down >/= 2 flights of steps reporting </= min difficulty for improvement in mobility and condition.  Baseline:  Extreme difficulty at eval 11/12/22: Moderate difficulty  Goal status: ONGOING  4.  Pt to improve FOTO score to >/= 54% to demo improvement in function Baseline: initial score 40 11/12/22: 36% Goal status: ONGOING  5.  Pt to be IND with all HEP and is able to maintain and progress their current LOF IND. Baseline: No previous HEP Goal status: MET  PLAN:  PT FREQUENCY: 1-2x/week  PT DURATION: 6 additional weeks, as of 11/12/22  PLANNED INTERVENTIONS: Therapeutic exercises, Therapeutic activity, Neuromuscular re-education, Balance training, Gait training, Patient/Family education, Self Care, Joint mobilization, Joint manipulation, Aquatic Therapy, Dry Needling, Cryotherapy, Moist heat, Taping, Manual therapy, and Re-evaluation.  PLAN FOR NEXT SESSION: Continue with core strengthening, lumbar spine mobility, pain modulation/neuro regulation activities, progression towards functional strengthening. Address lifting mechanics, consider resisted walking.   Mauri Reading, PT, DPT  12/03/22  6:38 PM    Check all possible CPT codes: 82956 - PT Re-evaluation, 97110- Therapeutic Exercise, [phone]- Neuro Re-education, 97140 - Manual Therapy,  97530 - Therapeutic Activities, 97535 - Self Care, [phone] - Mechanical traction, and 97014 - Electrical stimulation (unattended)    Check all conditions that are expected to impact treatment: {Conditions expected to impact treatment:None of these apply   If treatment provided at initial evaluation, no treatment charged due to lack of authorization.

## 2022-12-09 ENCOUNTER — Encounter: Payer: Self-pay | Admitting: Physical Therapy

## 2022-12-09 ENCOUNTER — Ambulatory Visit: Payer: Medicaid Other | Attending: Orthopaedic Surgery | Admitting: Physical Therapy

## 2022-12-09 DIAGNOSIS — M5459 Other low back pain: Secondary | ICD-10-CM | POA: Insufficient documentation

## 2022-12-09 DIAGNOSIS — M62830 Muscle spasm of back: Secondary | ICD-10-CM | POA: Diagnosis present

## 2022-12-09 DIAGNOSIS — R293 Abnormal posture: Secondary | ICD-10-CM | POA: Diagnosis present

## 2022-12-09 NOTE — Therapy (Unsigned)
OUTPATIENT PHYSICAL THERAPY TREATMENT NOTE   Patient Name: Faith [name] MRN: [id number] DOB:07-21-1990, 32 y.o., female Today's Date: 12/10/2022  END OF SESSION:  PT End of Session - 12/09/22 1745     Visit Number 12    Number of Visits 15    Date for PT Re-Evaluation 12/24/22    Authorization Type [employer]    Authorization Time Period 11/26/22-01/03/23    Authorization - Visit Number 3    Authorization - Number of Visits 5    Progress Note Due on Visit 15    PT Start Time 1745    PT Stop Time 1826    PT Time Calculation (min) 41 min    Activity Tolerance Patient tolerated treatment well;Patient limited by pain    Behavior During Therapy WFL for tasks assessed/performed                      Past Medical History:  Diagnosis Date   Chlamydia    Hypertension    Infection    UTI   Morbid obesity (HCC)    Pulmonary embolism (HCC)    Urinary tract infection    Vaginal Pap smear, abnormal    f/u was ok   Past Surgical History:  Procedure Laterality Date   CESAREAN SECTION     COLPOSCOPY     DILATION AND EVACUATION N/A 10/04/2012   Procedure: DILATATION AND EVACUATION;  Surgeon: Lesly Dukes, MD;  Location: [facility];  Service: Gynecology;  Laterality: N/A;   LEEP  11/2016   Patient Active Problem List   Diagnosis Date Noted   Chest pain 08/16/2020   Pneumonia due to COVID-19 virus 04/23/2020   History of COVID-19 04/23/2020   Tachycardia 04/23/2020   COVID-19 virus infection 04/02/2020   Pulmonary embolism (HCC) 04/02/2020   Morbid obesity (HCC)    Abnormal Pap smear of vagina 01/31/2016    PCP: Barbette Merino, NP  REFERRING PROVIDER: Low back pain  REFERRING DIAG: Marcene Corning, MD   Rationale for Evaluation and Treatment: Rehabilitation  THERAPY DIAG:  Other low back pain  Muscle spasm of back  Abnormal posture  ONSET DATE: October 2023  SUBJECTIVE:                                                                                                                                                                                            SUBJECTIVE STATEMENT:  Pt reports that she is doing well overall.  She has 0/10 pain today.  PERTINENT HISTORY:  Obesity  PAIN:  Are you having pain? Yes: NPRS scale: 5/10, At worst  7/10, at best 1/10 Pain location: cramp, heavy tightness, constant   Pain description: low back with referred to the L/R Aggravating factors: prolonged standing, prolong walking, sitting up straight Relieving factors: muscle relaxer, ibuprofen, heating  PRECAUTIONS: None  WEIGHT BEARING RESTRICTIONS: No  FALLS:  Has patient fallen in last 6 months? 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   OBJECTIVE:   DIAGNOSTIC FINDINGS:  X-ray 06/26/22 Lumbar spine  IMPRESSION: Negative.  PATIENT SURVEYS:  FOTO 40% and predicted 54%  SCREENING FOR RED FLAGS: Bowel or bladder incontinence: No Cauda equina syndrome: No  COGNITION: Overall cognitive status: Within functional limits for tasks assessed     SENSATION: WFL   POSTURE: rounded shoulders and forward head  PALPATION: TTP along bil lumbar paraspinals and QL with L>R, and pain with palpation at the L SIJ/ fortins area.  LUMBAR ROM:   AROM eval 11/12/22  Flexion 70 ERP 85  Extension 20 ERP 40  Right lateral flexion 20 50  Left lateral flexion 20 50  Right rotation    Left rotation     (Blank rows = not tested)  LOWER EXTREMITY ROM:     Active  Right eval Left eval  Hip flexion    Hip extension    Hip abduction    Hip adduction    Hip internal rotation    Hip external rotation     Knee flexion    Knee extension    Ankle dorsiflexion    Ankle plantarflexion    Ankle inversion    Ankle eversion     (Blank rows = not tested)  LOWER EXTREMITY MMT:    MMT Right eval Left eval Right 11/12/22 Left 11/12/22  Hip flexion 4+ 4+ 4+ 5  Hip extension 4+ 4+    Hip abduction 4+ 4 * 5 5  Hip adduction 4+ 4+ 5 5  Hip internal rotation      Hip external rotation      Knee flexion 5 4+ * 5 5  Knee extension 5 4+ 5 5  Ankle dorsiflexion      Ankle plantarflexion      Ankle inversion      Ankle eversion       (Blank rows = not tested) (* = concordant pain)  LUMBAR SPECIAL TESTS:  Quadrant test: Positive with trunk lean with ext/ L sidebend, SI Compression/distraction test: Postive, and forward flexion assessment reducted L SIJ superior movement  GAIT: Distance walked: 80 ft to treatment room Assistive device utilized: None Level of assistance: Complete Independence Comments: unremarkable.    TODAY'S TREATMENT:          [facility] Adult PT Treatment:                                                DATE: 12/09/2022  Therapeutic Exercise: NuStep, level 4 x 5 minutes  Supine ball squeeze, 5 sec hold x 20  SLR with CL pilates ring press down, x 15 each  Pball isometric squeeze - 2x10 Lt. LTR, 3 sec  hold  x 15  DKTC with small pball x 20 Marches with blue TB, x 20 SL Clamshells with green TB, x 20 each LE Bridge 2x10 Bil shoulder ext with abdominal contraction - 2x10   PATIENT EDUCATION:  Education details: evaluation findings, POC, goals, HEP with proper form.  Person educated: Patient Education method: Explanation, Verbal cues, and Handouts Education comprehension: verbalized understanding  HOME EXERCISE PROGRAM: Access Code: [email or web address] URL: [email or web address] Date: 09/15/2022 Prepared by: Lulu Riding  Exercises - Supine Posterior Pelvic Tilt  - 1 x daily - 7 x weekly - 2 sets - 10 reps - Lower Trunk Rotation  - 1 x daily - 7 x weekly  - 2 sets - 10 reps - Seated Flexion Stretch  - 3 x daily - 7 x weekly - 1-2 sets - 1-2 reps - 30 - 60 sec hold - Child's Pose Stretch  - 3 x daily - 7 x weekly - 1-2 sets - 1-2 reps - 30- 60 sec hold - Seated Hamstring Stretch  - 3 x daily - 7 x weekly - 2 sets - 2 reps - 30 hold - Supine Hamstring Stretch with Strap  - 1 x daily - 7 x weekly - 2 sets - 2 reps - 30 hold - SLR  - 1 x daily - 7 x weekly - 3 sets - 10-12 reps - 1 hold  ASSESSMENT:  CLINICAL IMPRESSION: Pt presents to clinic with very low pain today but reports that her pain can come on suddenly.  She was able to complete listed exercises, including some progressions including supine bridge and shoulder ext with abdominal contraction.  She has several transient increases in pain, but is able to manage these with brief rest and stretching.     OBJECTIVE IMPAIRMENTS: decreased activity tolerance, decreased endurance, decreased ROM, improper body mechanics, postural dysfunction, obesity, and pain.   ACTIVITY LIMITATIONS: standing, squatting, stairs, and locomotion level  PARTICIPATION LIMITATIONS: shopping, community activity, occupation, and yard work  PERSONAL FACTORS: Time since onset of injury/illness/exacerbation are also affecting patient's functional outcome.   REHAB POTENTIAL: Good  CLINICAL DECISION MAKING: Stable/uncomplicated  EVALUATION COMPLEXITY: Low   GOALS: Goals reviewed with patient? Yes  SHORT TERM GOALS: Target date: 10/13/2022  Pt to be IND with initial HEP for therapeutic progression Baseline: no previous HEP Goal status: MET  2.  Pt to be able to verbalize/ demo efficient posture and lifting mechanics to reduce and prevent low back pain Baseline:  limited knowledge of posture Goal status: ONGOING   LONG TERM GOALS: Target date: 12/24/2022, updated 11/12/22   Maintain functional trunk mobility reporting </= 2/10 max pain at end ranges for improvement in function. Baseline:  max pain is  7/10 Goal status: MET 11/12/22  2.  Pt to be able to stand/ walk for >/= 1 hour for endurance required for ADLs and work related tasks with </= 2/10 pain Baseline: pain started with standing for 3-5 min and brisk walking 11/12/22: unable to stand >5-10 minutes without medication Goal status: ONGOING  3.  Pt will be able to go up/ down >/= 2 flights of steps reporting </= min difficulty for improvement in mobility and condition.  Baseline:  Extreme difficulty at eval 11/12/22: Moderate difficulty  Goal status: ONGOING  4.  Pt to improve FOTO score to >/= 54% to demo improvement in function Baseline: initial score 40 11/12/22: 36% Goal status: ONGOING  5.  Pt to be IND with all HEP and is able to  maintain and progress their current LOF IND. Baseline: No previous HEP Goal status: MET  PLAN:  PT FREQUENCY: 1-2x/week  PT DURATION: 6 additional weeks, as of 11/12/22  PLANNED INTERVENTIONS: Therapeutic exercises, Therapeutic activity, Neuromuscular re-education, Balance training, Gait training, Patient/Family education, Self Care, Joint mobilization, Joint manipulation, Aquatic Therapy, Dry Needling, Cryotherapy, Moist heat, Taping, Manual therapy, and Re-evaluation.  PLAN FOR NEXT SESSION: Continue with core strengthening, lumbar spine mobility, pain modulation/neuro regulation activities, progression towards functional strengthening. Address lifting mechanics, consider resisted walking.   Kimberlee Nearing  PT 12/10/22  8:35 AM    Check all possible CPT codes: 40981 - PT Re-evaluation, 97110- Therapeutic Exercise, [phone]- Neuro Re-education, 97140 - Manual Therapy, 97530 - Therapeutic Activities, 97535 - Self Care, [phone] - Mechanical traction, and 97014 - Electrical stimulation (unattended)    Check all conditions that are expected to impact treatment: {Conditions expected to impact treatment:None of these apply   If treatment provided at initial evaluation, no treatment charged due to  lack of authorization.

## 2022-12-10 ENCOUNTER — Ambulatory Visit: Payer: Medicaid Other

## 2022-12-10 DIAGNOSIS — M62830 Muscle spasm of back: Secondary | ICD-10-CM

## 2022-12-10 DIAGNOSIS — M5459 Other low back pain: Secondary | ICD-10-CM

## 2022-12-10 DIAGNOSIS — R293 Abnormal posture: Secondary | ICD-10-CM

## 2022-12-10 NOTE — Therapy (Signed)
OUTPATIENT PHYSICAL THERAPY TREATMENT NOTE   Patient Name: Faith [name] MRN: [id number] DOB:1990/12/28, 32 y.o., female Today's Date: 12/10/2022  END OF SESSION:  PT End of Session - 12/10/22 1753     Visit Number 13    Number of Visits 15    Date for PT Re-Evaluation 12/24/22    Authorization Type [employer]    Authorization Time Period 11/26/22-01/03/23    Authorization - Visit Number 4    Authorization - Number of Visits 5    Progress Note Due on Visit 15    PT Start Time 1752    PT Stop Time 1830    PT Time Calculation (min) 38 min    Activity Tolerance Patient tolerated treatment well;Patient limited by pain    Behavior During Therapy WFL for tasks assessed/performed                       Past Medical History:  Diagnosis Date   Chlamydia    Hypertension    Infection    UTI   Morbid obesity (HCC)    Pulmonary embolism (HCC)    Urinary tract infection    Vaginal Pap smear, abnormal    f/u was ok   Past Surgical History:  Procedure Laterality Date   CESAREAN SECTION     COLPOSCOPY     DILATION AND EVACUATION N/A 10/04/2012   Procedure: DILATATION AND EVACUATION;  Surgeon: Lesly Dukes, MD;  Location: [facility];  Service: Gynecology;  Laterality: N/A;   LEEP  11/2016   Patient Active Problem List   Diagnosis Date Noted   Chest pain 08/16/2020   Pneumonia due to COVID-19 virus 04/23/2020   History of COVID-19 04/23/2020   Tachycardia 04/23/2020   COVID-19 virus infection 04/02/2020   Pulmonary embolism (HCC) 04/02/2020   Morbid obesity (HCC)    Abnormal Pap smear of vagina 01/31/2016    PCP: Barbette Merino, NP  REFERRING PROVIDER: Low back pain  REFERRING DIAG: Marcene Corning, MD   Rationale for Evaluation and Treatment: Rehabilitation  THERAPY DIAG:  Other low back pain  Muscle spasm of back  Abnormal posture  ONSET DATE: October 2023  SUBJECTIVE:                                                                                                                                                                                            SUBJECTIVE STATEMENT:  Patient reporting increased pain today to 6/10.  Reports increased pain after yesterday session.  PERTINENT HISTORY:  Obesity  PAIN:  Are you having pain? Yes: NPRS scale: 5/10, At  worst 7/10, at best 1/10 Pain location: cramp, heavy tightness, constant   Pain description: low back with referred to the L/R Aggravating factors: prolonged standing, prolong walking, sitting up straight Relieving factors: muscle relaxer, ibuprofen, heating  PRECAUTIONS: None  WEIGHT BEARING RESTRICTIONS: No  FALLS:  Has patient fallen in last 6 months? 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   OBJECTIVE:   DIAGNOSTIC FINDINGS:  X-ray 06/26/22 Lumbar spine  IMPRESSION: Negative.  PATIENT SURVEYS:  FOTO 40% and predicted 54%  SCREENING FOR RED FLAGS: Bowel or bladder incontinence: No Cauda equina syndrome: No  COGNITION: Overall cognitive status: Within functional limits for tasks assessed     SENSATION: WFL   POSTURE: rounded shoulders and forward head  PALPATION: TTP along bil lumbar paraspinals and QL with L>R, and pain with palpation at the L SIJ/ fortins area.  LUMBAR ROM:   AROM eval 11/12/22  Flexion 70 ERP 85  Extension 20 ERP 40  Right lateral flexion 20 50  Left lateral flexion 20 50  Right rotation    Left rotation     (Blank rows = not tested)  LOWER EXTREMITY ROM:     Active  Right eval Left eval  Hip flexion    Hip extension    Hip abduction    Hip adduction    Hip internal  rotation    Hip external rotation    Knee flexion    Knee extension    Ankle dorsiflexion    Ankle plantarflexion    Ankle inversion    Ankle eversion     (Blank rows = not tested)  LOWER EXTREMITY MMT:    MMT Right eval Left eval Right 11/12/22 Left 11/12/22  Hip flexion 4+ 4+ 4+ 5  Hip extension 4+ 4+    Hip abduction 4+ 4 * 5 5  Hip adduction 4+ 4+ 5 5  Hip internal rotation      Hip external rotation      Knee flexion 5 4+ * 5 5  Knee extension 5 4+ 5 5  Ankle dorsiflexion      Ankle plantarflexion      Ankle inversion      Ankle eversion       (Blank rows = not tested) (* = concordant pain)  LUMBAR SPECIAL TESTS:  Quadrant test: Positive with trunk lean with ext/ L sidebend, SI Compression/distraction test: Postive, and forward flexion assessment reducted L SIJ superior movement  GAIT: Distance walked: 80 ft to treatment room Assistive device utilized: None Level of assistance: Complete Independence Comments: unremarkable.     TODAY'S TREATMENT:          [facility] Adult PT Treatment:                                                DATE: 12/10/2022   Therapeutic Exercise: NuStep, level 4 x 5 minutes  Seated ball squeeze, 5 sec hold x 20  SLR with CL pilates ring press down, x 15 each  Marches with blue TB, x 1  min SL Clamshells with blue TB, x 1 min Bridge 2x15 Supine Bil shoulder ext with abdominal contraction, blue TB- 2x10   PATIENT EDUCATION:  Education details: evaluation findings, POC, goals, HEP with proper form.  Person educated: Patient Education method: Explanation, Verbal cues, and Handouts Education comprehension: verbalized understanding  HOME EXERCISE PROGRAM: Access Code: [email or web address] URL: [email or web address] Date: 09/15/2022 Prepared by: Lulu Riding  Exercises - Supine Posterior Pelvic Tilt  - 1 x daily - 7 x weekly - 2 sets - 10 reps - Lower Trunk Rotation  - 1 x daily - 7 x weekly - 2 sets - 10 reps - Seated  Flexion Stretch  - 3 x daily - 7 x weekly - 1-2 sets - 1-2 reps - 30 - 60 sec hold - Child's Pose Stretch  - 3 x daily - 7 x weekly - 1-2 sets - 1-2 reps - 30- 60 sec hold - Seated Hamstring Stretch  - 3 x daily - 7 x weekly - 2 sets - 2 reps - 30 hold - Supine Hamstring Stretch with Strap  - 1 x daily - 7 x weekly - 2 sets - 2 reps - 30 hold - SLR  - 1 x daily - 7 x weekly - 3 sets - 10-12 reps - 1 hold  ASSESSMENT:  CLINICAL IMPRESSION: Patient started today session with increased pain compared to yesterday.  She was able to perform recumbent stepper with concurrent moist heat pack.  Modified marches and clamshells to seated position for concurrent use of modality.  She was otherwise able to perform exercises without any increase of pain, or report of muscle spasm.  Plan is to reassess progress towards established goals at next visit and request additional visits.     OBJECTIVE IMPAIRMENTS: decreased activity tolerance, decreased endurance, decreased ROM, improper body mechanics, postural dysfunction, obesity, and pain.   ACTIVITY LIMITATIONS: standing, squatting, stairs, and locomotion level  PARTICIPATION LIMITATIONS: shopping, community activity, occupation, and yard work  PERSONAL FACTORS: Time since onset of injury/illness/exacerbation are also affecting patient's functional outcome.   REHAB POTENTIAL: Good  CLINICAL DECISION MAKING: Stable/uncomplicated  EVALUATION COMPLEXITY: Low   GOALS: Goals reviewed with patient? Yes  SHORT TERM GOALS: Target date: 10/13/2022  Pt to be IND with initial HEP for therapeutic progression Baseline: no previous HEP Goal status: MET  2.  Pt to be able to verbalize/ demo efficient posture and lifting mechanics to reduce and prevent low back pain Baseline:  limited knowledge of posture Goal status: ONGOING   LONG TERM GOALS: Target date: 12/24/2022, updated 11/12/22   Maintain functional trunk mobility reporting </= 2/10 max pain at end  ranges for improvement in function. Baseline:  max pain is 7/10 Goal status: MET 11/12/22  2.  Pt to be able to stand/ walk for >/= 1 hour for endurance required for ADLs and work related tasks with </= 2/10 pain Baseline: pain started with standing for 3-5 min and brisk walking 11/12/22: unable to stand >5-10 minutes without medication Goal status: ONGOING  3.  Pt will be able to go up/ down >/= 2 flights of steps reporting </= min difficulty for improvement in mobility and condition.  Baseline:  Extreme difficulty at eval 11/12/22: Moderate difficulty  Goal status: ONGOING  4.  Pt to improve FOTO score to >/= 54% to demo improvement in function Baseline: initial score 40 11/12/22: 36% Goal status: ONGOING  5.  Pt to be IND with all HEP and is able to maintain and  progress their current LOF IND. Baseline: No previous HEP Goal status: MET  PLAN:  PT FREQUENCY: 1-2x/week  PT DURATION: 6 additional weeks, as of 11/12/22  PLANNED INTERVENTIONS: Therapeutic exercises, Therapeutic activity, Neuromuscular re-education, Balance training, Gait training, Patient/Family education, Self Care, Joint mobilization, Joint manipulation, Aquatic Therapy, Dry Needling, Cryotherapy, Moist heat, Taping, Manual therapy, and Re-evaluation.  PLAN FOR NEXT SESSION: Continue with core strengthening, lumbar spine mobility, pain modulation/neuro regulation activities, progression towards functional strengthening. Address lifting mechanics, consider resisted walking.   Mauri Reading PT, DPT 12/10/22  6:38 PM    Check all possible CPT codes: 16109 - PT Re-evaluation, 97110- Therapeutic Exercise, [phone]- Neuro Re-education, 97140 - Manual Therapy, 97530 - Therapeutic Activities, 97535 - Self Care, [phone] - Mechanical traction, and 97014 - Electrical stimulation (unattended)    Check all conditions that are expected to impact treatment: {Conditions expected to impact treatment:None of these apply   If treatment  provided at initial evaluation, no treatment charged due to lack of authorization.

## 2022-12-16 ENCOUNTER — Ambulatory Visit: Payer: Medicaid Other | Admitting: Physical Therapy

## 2022-12-16 ENCOUNTER — Encounter: Payer: Self-pay | Admitting: Physical Therapy

## 2022-12-16 DIAGNOSIS — M5459 Other low back pain: Secondary | ICD-10-CM | POA: Diagnosis not present

## 2022-12-16 DIAGNOSIS — R293 Abnormal posture: Secondary | ICD-10-CM

## 2022-12-16 DIAGNOSIS — M62830 Muscle spasm of back: Secondary | ICD-10-CM

## 2022-12-16 NOTE — Therapy (Unsigned)
OUTPATIENT PHYSICAL THERAPY TREATMENT NOTE   Patient Name: Faith [name] MRN: [id number] DOB:Jun 28, 1990, 32 y.o., female Today's Date: 12/17/2022  END OF SESSION:  PT End of Session - 12/16/22 1749     Visit Number 14    Number of Visits 15    Date for PT Re-Evaluation 12/24/22    Authorization Type [employer]    Authorization Time Period 11/26/22-01/03/23    Authorization - Number of Visits 5    Progress Note Due on Visit 15    PT Start Time 0550    PT Stop Time 0630    PT Time Calculation (min) 40 min    Activity Tolerance Patient tolerated treatment well;Patient limited by pain    Behavior During Therapy WFL for tasks assessed/performed                       Past Medical History:  Diagnosis Date   Chlamydia    Hypertension    Infection    UTI   Morbid obesity (HCC)    Pulmonary embolism (HCC)    Urinary tract infection    Vaginal Pap smear, abnormal    f/u was ok   Past Surgical History:  Procedure Laterality Date   CESAREAN SECTION     COLPOSCOPY     DILATION AND EVACUATION N/A 10/04/2012   Procedure: DILATATION AND EVACUATION;  Surgeon: Lesly Dukes, MD;  Location: [facility];  Service: Gynecology;  Laterality: N/A;   LEEP  11/2016   Patient Active Problem List   Diagnosis Date Noted   Chest pain 08/16/2020   Pneumonia due to COVID-19 virus 04/23/2020   History of COVID-19 04/23/2020   Tachycardia 04/23/2020   COVID-19 virus infection 04/02/2020   Pulmonary embolism (HCC) 04/02/2020   Morbid obesity (HCC)    Abnormal Pap smear of vagina 01/31/2016    PCP: Barbette Merino, NP  REFERRING PROVIDER: Low back pain  REFERRING DIAG: Marcene Corning, MD   Rationale for Evaluation and Treatment: Rehabilitation  THERAPY DIAG:  Other low back pain  Muscle spasm of back  Abnormal posture  ONSET DATE: October 2023  SUBJECTIVE:                                                                                                                                                                                            SUBJECTIVE STATEMENT:  Pt reports 7/10 pain after being 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  PERTINENT HISTORY:  Obesity  PAIN:  Are you having pain? Yes: NPRS scale: 5/10, At worst 7/10, at best 1/10 Pain location:  cramp, heavy tightness, constant   Pain description: low back with referred to the L/R Aggravating factors: prolonged standing, prolong walking, sitting up straight Relieving factors: muscle relaxer, ibuprofen, heating  PRECAUTIONS: None  WEIGHT BEARING RESTRICTIONS: No  FALLS:  Has patient fallen in last 6 months? 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   OBJECTIVE:   DIAGNOSTIC FINDINGS:  X-ray 06/26/22 Lumbar spine  IMPRESSION: Negative.  PATIENT SURVEYS:  FOTO 40% and predicted 54%  SCREENING FOR RED FLAGS: Bowel or bladder incontinence: No Cauda equina syndrome: No  COGNITION: Overall cognitive status: Within functional limits for tasks assessed     SENSATION: WFL   POSTURE: rounded shoulders and forward head  PALPATION: TTP along bil lumbar paraspinals and QL with L>R, and pain with palpation at the L SIJ/ fortins area.  LUMBAR ROM:   AROM eval 11/12/22  Flexion 70 ERP 85  Extension 20 ERP 40  Right lateral flexion 20 50  Left lateral flexion 20 50  Right rotation    Left rotation     (Blank rows = not tested)  LOWER EXTREMITY ROM:     Active  Right eval Left eval  Hip flexion    Hip extension    Hip abduction    Hip adduction    Hip internal rotation    Hip external rotation    Knee flexion     Knee extension    Ankle dorsiflexion    Ankle plantarflexion    Ankle inversion    Ankle eversion     (Blank rows = not tested)  LOWER EXTREMITY MMT:    MMT Right eval Left eval Right 11/12/22 Left 11/12/22  Hip flexion 4+ 4+ 4+ 5  Hip extension 4+ 4+    Hip abduction 4+ 4 * 5 5  Hip adduction 4+ 4+ 5 5  Hip internal rotation      Hip external rotation      Knee flexion 5 4+ * 5 5  Knee extension 5 4+ 5 5  Ankle dorsiflexion      Ankle plantarflexion      Ankle inversion      Ankle eversion       (Blank rows = not tested) (* = concordant pain)  LUMBAR SPECIAL TESTS:  Quadrant test: Positive with trunk lean with ext/ L sidebend, SI Compression/distraction test: Postive, and forward flexion assessment reducted L SIJ superior movement  GAIT: Distance walked: 80 ft to treatment room Assistive device utilized: None Level of assistance: Complete Independence Comments: unremarkable.     TODAY'S TREATMENT:         [facility] Adult PT Treatment:                                                DATE: 12/16/2022   Therapeutic Exercise: NuStep, level 4 x 5 minutes  LTR - 20x Seated ball squeeze, 5 sec hold x 20 SLR with CL pilates ring press down, x 15 each  Marches with blue TB, x 1 min SL Clamshells with blue TB,  x 1 min Bridge 2x15 Isometric abdominal ball squeeze - 5'' 2x10  [facility] Adult PT Treatment:                                                DATE: 12/10/2022   Therapeutic Exercise: NuStep, level 4 x 5 minutes  Seated ball squeeze, 5 sec hold x 20  SLR with CL pilates ring press down, x 15 each  Marches with blue TB, x 1 min SL Clamshells with blue TB, x 1 min Bridge 2x15 Supine Bil shoulder ext with abdominal contraction, blue TB- 2x10   PATIENT EDUCATION:  Education details: evaluation findings, POC, goals, HEP with proper form.  Person educated: Patient Education method: Explanation, Verbal cues, and Handouts Education comprehension: verbalized  understanding  HOME EXERCISE PROGRAM: Access Code: [email or web address] URL: [email or web address] Date: 09/15/2022 Prepared by: Lulu Riding  Exercises - Supine Posterior Pelvic Tilt  - 1 x daily - 7 x weekly - 2 sets - 10 reps - Lower Trunk Rotation  - 1 x daily - 7 x weekly - 2 sets - 10 reps - Seated Flexion Stretch  - 3 x daily - 7 x weekly - 1-2 sets - 1-2 reps - 30 - 60 sec hold - Child's Pose Stretch  - 3 x daily - 7 x weekly - 1-2 sets - 1-2 reps - 30- 60 sec hold - Seated Hamstring Stretch  - 3 x daily - 7 x weekly - 2 sets - 2 reps - 30 hold - Supine Hamstring Stretch with Strap  - 1 x daily - 7 x weekly - 2 sets - 2 reps - 30 hold - SLR  - 1 x daily - 7 x weekly - 3 sets - 10-12 reps - 1 hold  ASSESSMENT:  CLINICAL IMPRESSION: Pt arrives to clinic with higher pain today after working with children at work today.  Regressed exercises as appropriate to avoid increase in pain.  Pt able to complete listed exercises with mild transient increase in baseline pain.       OBJECTIVE IMPAIRMENTS: decreased activity tolerance, decreased endurance, decreased ROM, improper body mechanics, postural dysfunction, obesity, and pain.   ACTIVITY LIMITATIONS: standing, squatting, stairs, and locomotion level  PARTICIPATION LIMITATIONS: shopping, community activity, occupation, and yard work  PERSONAL FACTORS: Time since onset of injury/illness/exacerbation are also affecting patient's functional outcome.   REHAB POTENTIAL: Good  CLINICAL DECISION MAKING: Stable/uncomplicated  EVALUATION COMPLEXITY: Low   GOALS: Goals reviewed with patient? Yes  SHORT TERM GOALS: Target date: 10/13/2022  Pt to be IND with initial HEP for therapeutic progression Baseline: no previous HEP Goal status: MET  2.  Pt to be able to verbalize/ demo efficient posture and lifting mechanics to reduce and prevent low back pain Baseline:  limited knowledge of posture Goal status: ONGOING   LONG  TERM GOALS: Target date: 12/24/2022, updated 11/12/22   Maintain functional trunk mobility reporting </= 2/10 max pain at end ranges for improvement in function. Baseline:  max pain is 7/10 Goal status: MET 11/12/22  2.  Pt to be able to stand/ walk for >/= 1 hour for endurance required for ADLs and work related tasks with </= 2/10 pain Baseline: pain started with standing for 3-5 min and brisk walking 11/12/22: unable to stand >5-10 minutes without medication Goal status: ONGOING  3.  Pt will be able  to go up/ down >/= 2 flights of steps reporting </= min difficulty for improvement in mobility and condition.  Baseline:  Extreme difficulty at eval 11/12/22: Moderate difficulty  Goal status: ONGOING  4.  Pt to improve FOTO score to >/= 54% to demo improvement in function Baseline: initial score 40 11/12/22: 36% Goal status: ONGOING  5.  Pt to be IND with all HEP and is able to maintain and progress their current LOF IND. Baseline: No previous HEP Goal status: MET  PLAN:  PT FREQUENCY: 1-2x/week  PT DURATION: 6 additional weeks, as of 11/12/22  PLANNED INTERVENTIONS: Therapeutic exercises, Therapeutic activity, Neuromuscular re-education, Balance training, Gait training, Patient/Family education, Self Care, Joint mobilization, Joint manipulation, Aquatic Therapy, Dry Needling, Cryotherapy, Moist heat, Taping, Manual therapy, and Re-evaluation.  PLAN FOR NEXT SESSION: Continue with core strengthening, lumbar spine mobility, pain modulation/neuro regulation activities, progression towards functional strengthening. Address lifting mechanics, consider resisted walking.   Kimberlee Nearing  PT, DPT 12/17/22  10:08 AM    Check all possible CPT codes: 40981 - PT Re-evaluation, 97110- Therapeutic Exercise, [phone]- Neuro Re-education, 97140 - Manual Therapy, 97530 - Therapeutic Activities, 97535 - Self Care, [phone] - Mechanical traction, and 97014 - Electrical stimulation (unattended)    Check  all conditions that are expected to impact treatment: {Conditions expected to impact treatment:None of these apply   If treatment provided at initial evaluation, no treatment charged due to lack of authorization.

## 2022-12-17 ENCOUNTER — Ambulatory Visit: Payer: Medicaid Other

## 2022-12-17 DIAGNOSIS — M5459 Other low back pain: Secondary | ICD-10-CM | POA: Diagnosis not present

## 2022-12-17 DIAGNOSIS — M62830 Muscle spasm of back: Secondary | ICD-10-CM

## 2022-12-17 DIAGNOSIS — R293 Abnormal posture: Secondary | ICD-10-CM

## 2022-12-17 NOTE — Therapy (Signed)
OUTPATIENT PHYSICAL THERAPY TREATMENT NOTE   Patient Name: Faith [name] MRN: [id number] DOB:[birth year], 32 y.o., female Today's Date: 12/17/2022  END OF SESSION:  PT End of Session - 12/17/22 1751     Visit Number 15    Number of Visits 15    Date for PT Re-Evaluation 12/24/22    Authorization Type [employer]    Authorization Time Period 11/26/22-01/03/23                        Past Medical History:  Diagnosis Date   Chlamydia    Hypertension    Infection    UTI   Morbid obesity (HCC)    Pulmonary embolism (HCC)    Urinary tract infection    Vaginal Pap smear, abnormal    f/u was ok   Past Surgical History:  Procedure Laterality Date   CESAREAN SECTION     COLPOSCOPY     DILATION AND EVACUATION N/A 10/04/2012   Procedure: DILATATION AND EVACUATION;  Surgeon: Lesly Dukes, MD;  Location: [facility];  Service: Gynecology;  Laterality: N/A;   LEEP  11/2016   Patient Active Problem List   Diagnosis Date Noted   Chest pain 08/16/2020   Pneumonia due to COVID-19 virus 04/23/2020   History of COVID-19 04/23/2020   Tachycardia 04/23/2020   COVID-19 virus infection 04/02/2020   Pulmonary embolism (HCC) 04/02/2020   Morbid obesity (HCC)    Abnormal Pap smear of vagina 01/31/2016    PCP: Barbette Merino, NP  REFERRING PROVIDER: Low back pain  REFERRING DIAG: Marcene Corning, MD  Rationale for Evaluation and Treatment: Rehabilitation  THERAPY DIAG:  No diagnosis found.  ONSET DATE: October 2023  SUBJECTIVE:                                                                                                                                                                                           SUBJECTIVE STATEMENT:  *** RECERT? Schedule?   She states that her symptoms have become less frequent. She states that has become easier to position her sleeping. She has improved sleep quality.   PERTINENT HISTORY:  Obesity  PAIN:  Are you  having pain? Yes: NPRS scale: 5/10, At worst 7/10, at best 0/10 Pain location: cramp, heavy tightness, constant   Pain description: low back with referred to the L/R Aggravating factors: prolonged standing, prolong walking, sitting up straight Relieving factors: muscle relaxer, ibuprofen, heating  PRECAUTIONS: None  WEIGHT BEARING RESTRICTIONS: No  FALLS:  Has patient fallen in last 6 months? 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   OBJECTIVE:   DIAGNOSTIC FINDINGS:  X-ray 06/26/22 Lumbar spine  IMPRESSION: Negative.  PATIENT SURVEYS:  FOTO 40% and predicted 54%  12/17/22: 37%  SCREENING FOR RED FLAGS: Bowel or bladder incontinence: No Cauda equina syndrome: No  COGNITION: Overall cognitive status: Within functional limits for tasks assessed     SENSATION: WFL   POSTURE: rounded shoulders and forward head  PALPATION: TTP along bil lumbar paraspinals and QL with L>R, and pain with palpation at the L SIJ/ fortins area.  LUMBAR ROM:   AROM eval 11/12/22  Flexion 70 ERP 85  Extension 20 ERP 40  Right lateral flexion 20 50  Left lateral flexion 20 50  Right rotation    Left rotation     (Blank rows = not tested)  LOWER EXTREMITY ROM:     Active  Right eval Left eval  Hip flexion    Hip extension    Hip abduction    Hip adduction    Hip internal rotation    Hip external rotation    Knee flexion    Knee extension    Ankle dorsiflexion    Ankle plantarflexion    Ankle inversion    Ankle eversion     (Blank rows = not tested)  LOWER EXTREMITY MMT:    MMT Right eval Left eval Right 11/12/22 Left 11/12/22  Hip  flexion 4+ 4+ 4+ 5  Hip extension 4+ 4+    Hip abduction 4+ 4 * 5 5  Hip adduction 4+ 4+ 5 5  Hip internal rotation      Hip external rotation      Knee flexion 5 4+ * 5 5  Knee extension 5 4+ 5 5  Ankle dorsiflexion      Ankle plantarflexion      Ankle inversion      Ankle eversion       (Blank rows = not tested) (* = concordant pain)  LUMBAR SPECIAL TESTS:  Quadrant test: Positive with trunk lean with ext/ L sidebend, SI Compression/distraction test: Postive, and forward flexion assessment reducted L SIJ superior movement  GAIT: Distance walked: 80 ft to treatment room Assistive device utilized: None Level of assistance: Complete Independence Comments: unremarkable.     TODAY'S TREATMENT:        [facility] Adult PT Treatment:                                                DATE: 12/17/2022  Therapeutic Exercise: NuStep, level 4 x 5 minutes  LTR - 20x Seated ball squeeze, 5 sec hold x 20 SLR with CL pilates ring press down, x 15 each  Marches with blue TB, x 1 min SL Clamshells with blue TB, x 1 min Bridge 2x15 Isometric abdominal ball squeeze - 5'' 2x10  Manual Therapy: *** Neuromuscular re-ed: *** Therapeutic Activity: *** Modalities: *** Self Care: ***    [facility] Adult [facility] Treatment:  DATE: 12/16/2022   Therapeutic Exercise: NuStep, level 4 x 5 minutes  LTR - 20x Seated ball squeeze, 5 sec hold x 20 SLR with CL pilates ring press down, x 15 each  Marches with blue TB, x 1 min SL Clamshells with blue TB, x 1 min Bridge 2x15 Isometric abdominal ball squeeze - 5'' 2x10  [facility] Adult PT Treatment:                                                DATE: 12/10/2022   Therapeutic Exercise: NuStep, level 4 x 5 minutes  Seated ball squeeze, 5 sec hold x 20  SLR with CL pilates ring press down, x 15 each  Marches with blue TB, x 1 min SL Clamshells with blue TB, x 1 min Bridge 2x15 Supine Bil shoulder ext with abdominal  contraction, blue TB- 2x10   PATIENT EDUCATION:  Education details: evaluation findings, POC, goals, HEP with proper form.  Person educated: Patient Education method: Explanation, Verbal cues, and Handouts Education comprehension: verbalized understanding  HOME EXERCISE PROGRAM: Access Code: [email or web address] URL: [email or web address] Date: 09/15/2022 Prepared by: Lulu Riding  Exercises - Supine Posterior Pelvic Tilt  - 1 x daily - 7 x weekly - 2 sets - 10 reps - Lower Trunk Rotation  - 1 x daily - 7 x weekly - 2 sets - 10 reps - Seated Flexion Stretch  - 3 x daily - 7 x weekly - 1-2 sets - 1-2 reps - 30 - 60 sec hold - Child's Pose Stretch  - 3 x daily - 7 x weekly - 1-2 sets - 1-2 reps - 30- 60 sec hold - Seated Hamstring Stretch  - 3 x daily - 7 x weekly - 2 sets - 2 reps - 30 hold - Supine Hamstring Stretch with Strap  - 1 x daily - 7 x weekly - 2 sets - 2 reps - 30 hold - SLR  - 1 x daily - 7 x weekly - 3 sets - 10-12 reps - 1 hold  ASSESSMENT:  CLINICAL IMPRESSION: ***  Pt arrives to clinic with higher pain today after working with children at work today.  Regressed exercises as appropriate to avoid increase in pain.  Pt able to complete listed exercises with mild transient increase in baseline pain.       OBJECTIVE IMPAIRMENTS: decreased activity tolerance, decreased endurance, decreased ROM, improper body mechanics, postural dysfunction, obesity, and pain.   ACTIVITY LIMITATIONS: standing, squatting, stairs, and locomotion level  PARTICIPATION LIMITATIONS: shopping, community activity, occupation, and yard work  PERSONAL FACTORS: Time since onset of injury/illness/exacerbation are also affecting patient's functional outcome.   REHAB POTENTIAL: Good  CLINICAL DECISION MAKING: Stable/uncomplicated  EVALUATION COMPLEXITY: Low   GOALS: Goals reviewed with patient? Yes  SHORT TERM GOALS: Target date: 10/13/2022  Pt to be IND with initial HEP for  therapeutic progression Baseline: no previous HEP Goal status: MET  2.  Pt to be able to verbalize/ demo efficient posture and lifting mechanics to reduce and prevent low back pain Baseline:  limited knowledge of posture Goal status: ONGOING   LONG TERM GOALS: Target date: 12/24/2022, updated 11/12/22 1-2x/week for 6 weeks   Maintain functional trunk mobility reporting </= 2/10 max pain at end ranges for improvement in function. Baseline:  max pain is 7/10 Goal status: MET 11/12/22  2.  Pt to be able to stand/ walk for >/= 1 hour for endurance required for ADLs and work related tasks with </= 2/10 pain Baseline: pain started with standing for 3-5 min and brisk walking 11/12/22: unable to stand >5-10 minutes without medication 12/17/22: unable to stand >5-10 minutes without medication Goal status: ONGOING  3.  Pt will be able to go up/ down >/= 2 flights of steps reporting </= min difficulty for improvement in mobility and condition.  Baseline:  Extreme difficulty at eval 11/12/22: Moderate difficulty  12/17/22: minimal difficulty, requires some breaks to complete  Goal status: MET   4.  Pt to improve FOTO score to >/= 54% to demo improvement in function Baseline: initial score 40 11/12/22: 36% 12/17/22: 37% Goal status: ONGOING  5.  Pt to be IND with all HEP and is able to maintain and progress their current LOF IND. Baseline: No previous HEP Goal status: MET  PLAN:  PT FREQUENCY: 1-2x/week  PT DURATION: 6 additional weeks, as of 11/12/22  PLANNED INTERVENTIONS: Therapeutic exercises, Therapeutic activity, Neuromuscular re-education, Balance training, Gait training, Patient/Family education, Self Care, Joint mobilization, Joint manipulation, Aquatic Therapy, Dry Needling, Cryotherapy, Moist heat, Taping, Manual therapy, and Re-evaluation.  PLAN FOR NEXT SESSION: Continue with core strengthening, lumbar spine mobility, pain modulation/neuro regulation activities, progression  towards functional strengthening. Address lifting mechanics, consider resisted walking.   Mauri Reading PT, DPT 12/17/22  5:52 PM    Check all possible CPT codes: 93235 - PT Re-evaluation, 97110- Therapeutic Exercise, [phone]- Neuro Re-education, 97140 - Manual Therapy, 97530 - Therapeutic Activities, 97535 - Self Care, [phone] - Mechanical traction, and 97014 - Electrical stimulation (unattended)    Check all conditions that are expected to impact treatment: {Conditions expected to impact treatment:None of these apply   If treatment provided at initial evaluation, no treatment charged due to lack of authorization.

## 2022-12-24 ENCOUNTER — Ambulatory Visit: Payer: Medicaid Other

## 2022-12-24 DIAGNOSIS — M62830 Muscle spasm of back: Secondary | ICD-10-CM

## 2022-12-24 DIAGNOSIS — M5459 Other low back pain: Secondary | ICD-10-CM

## 2022-12-24 NOTE — Therapy (Unsigned)
OUTPATIENT PHYSICAL THERAPY TREATMENT NOTE   Patient Name: Faith [name] MRN: [id number] DOB:Sep 13, 1990, 32 y.o., female Today's Date: 12/24/2022   END OF SESSION:  PT End of Session - 12/24/22 1718     Visit Number 16    Number of Visits 27    Date for PT Re-Evaluation 01/31/23    Authorization Type [employer]    PT Start Time 1718    PT Stop Time 1800    PT Time Calculation (min) 42 min    Activity Tolerance Patient limited by pain    Behavior During Therapy WFL for tasks assessed/performed                         Past Medical History:  Diagnosis Date   Chlamydia    Hypertension    Infection    UTI   Morbid obesity (HCC)    Pulmonary embolism (HCC)    Urinary tract infection    Vaginal Pap smear, abnormal    f/u was ok   Past Surgical History:  Procedure Laterality Date   CESAREAN SECTION     COLPOSCOPY     DILATION AND EVACUATION N/A 10/04/2012   Procedure: DILATATION AND EVACUATION;  Surgeon: Faith Dukes, MD;  Location: [facility];  Service: Gynecology;  Laterality: N/A;   LEEP  11/2016   Patient Active Problem List   Diagnosis Date Noted   Chest pain 08/16/2020   Pneumonia due to COVID-19 virus 04/23/2020   History of COVID-19 04/23/2020   Tachycardia 04/23/2020   COVID-19 virus infection 04/02/2020   Pulmonary embolism (HCC) 04/02/2020   Morbid obesity (HCC)    Abnormal Pap smear of vagina 01/31/2016    PCP: Faith Merino, NP  REFERRING PROVIDER: Low back pain  REFERRING DIAG: Faith Corning, MD  Rationale for Evaluation and Treatment: Rehabilitation  THERAPY DIAG:  Other low back pain  Muscle spasm of back  ONSET DATE: October 2023  SUBJECTIVE:                                                                                                                                                                                           SUBJECTIVE STATEMENT:  Faith [name] is reporting to skilled PT with increased lumbar  pain and recent radiating numbness in BIL glutes. She did end up walking a lot over the weekend in short bouts and has been working 12 hour shifts this week.    PERTINENT HISTORY:  Obesity  PAIN:  Are you having pain? Yes: NPRS scale: 5/10, At worst 7/10, at best 0/10 Pain location: cramp, heavy tightness,  constant   Pain description: low back with referred to the L/R Aggravating factors: prolonged standing, prolong walking, sitting up straight Relieving factors: muscle relaxer, ibuprofen, heating  PRECAUTIONS: None  WEIGHT BEARING RESTRICTIONS: No  FALLS:  Has patient fallen in last 6 months? 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   OBJECTIVE:   DIAGNOSTIC FINDINGS:  X-ray 06/26/22 Lumbar spine  IMPRESSION: Negative.  PATIENT SURVEYS:  FOTO 40% and predicted 54%  12/17/22: 37%  SCREENING FOR RED FLAGS: Bowel or bladder incontinence: No Cauda equina syndrome: No  COGNITION: Overall cognitive status: Within functional limits for tasks assessed     SENSATION: WFL   POSTURE: rounded shoulders and forward head  PALPATION: TTP along bil lumbar paraspinals and QL with L>R, and pain with palpation at the L SIJ/ fortins area.  LUMBAR ROM:   AROM eval 11/12/22  Flexion 70 ERP 85  Extension 20 ERP 40  Right lateral flexion 20 50  Left lateral flexion 20 50  Right rotation    Left rotation      Not assessed 12/17/22 d/t increased pain severity today    LOWER EXTREMITY MMT:    MMT Right eval Left eval Right 11/12/22 Left 11/12/22  Hip flexion 4+ 4+ 4+ 5  Hip extension 4+ 4+    Hip abduction 4+ 4 * 5 5  Hip adduction 4+ 4+ 5 5   Hip internal rotation      Hip external rotation      Knee flexion 5 4+ * 5 5  Knee extension 5 4+ 5 5  Ankle dorsiflexion      Ankle plantarflexion      Ankle inversion      Ankle eversion       (Blank rows = not tested) (* = concordant pain)  LUMBAR SPECIAL TESTS:  Quadrant test: Positive with trunk lean with ext/ L sidebend, SI Compression/distraction test: Postive, and forward flexion assessment reducted L SIJ superior movement  GAIT: Distance walked: 80 ft to treatment room Assistive device utilized: None Level of assistance: Complete Independence Comments: unremarkable.     TODAY'S TREATMENT:        [facility] Adult PT Treatment:                                                DATE: 12/24/2022  Therapeutic Exercise: Nustep, level 3 x 4 minutes -- reports peripheralization of symptoms (numbness)  Prone lying x several minutes following nustep and between exercises --- centralized and decreased symptoms Prone press ups, 3x 10  Prone knee bend, 3 x 10  Pt education regarding purpose of new sustained and repeated lumbar extension; symptoms centralization, and use of exercises for independent pain modulation throughout the day   [facility] Adult PT Treatment:                                                DATE: 12/17/2022  Therapeutic Exercise: NuStep, level 3 x 8 minutes concurrent with moist heat pack  Therapeutic Activity:  Reassessment of objective measures and subjective assessment regarding progress towards established goals, discuss plan for incorporating aquatic therapy, updated POC.  Modalities:  Moist heat pack as noted above Consider use of e-stim at next treatment session, to assess for possible benefit of home TENS unit  Plan to provide aquatic HEP if well tolerated    [facility] Adult PT Treatment:                                                DATE: 12/16/2022   Therapeutic Exercise: NuStep, level 4 x 5 minutes  LTR - 20x Seated ball squeeze, 5 sec hold x 20 SLR with  CL pilates ring press down, x 15 each  Marches with blue TB, x 1 min SL Clamshells with blue TB, x 1 min Bridge 2x15 Isometric abdominal ball squeeze - 5'' 2x10    PATIENT EDUCATION:  Education details: evaluation findings, POC, goals, HEP with proper form.  Person educated: Patient Education method: Explanation, Verbal cues, and Handouts Education comprehension: verbalized understanding  HOME EXERCISE PROGRAM: Access Code: [email or web address] URL: [email or web address] Date: 09/15/2022 Prepared by: Lulu Riding  Exercises - Supine Posterior Pelvic Tilt  - 1 x daily - 7 x weekly - 2 sets - 10 reps - Lower Trunk Rotation  - 1 x daily - 7 x weekly - 2 sets - 10 reps - Seated Flexion Stretch  - 3 x daily - 7 x weekly - 1-2 sets - 1-2 reps - 30 - 60 sec hold - Child's Pose Stretch  - 3 x daily - 7 x weekly - 1-2 sets - 1-2 reps - 30- 60 sec hold - Seated Hamstring Stretch  - 3 x daily - 7 x weekly - 2 sets - 2 reps - 30 hold - Supine Hamstring Stretch with Strap  - 1 x daily - 7 x weekly - 2 sets - 2 reps - 30 hold - SLR  - 1 x daily - 7 x weekly - 3 sets - 10-12 reps - 1 hold  ASSESSMENT:  CLINICAL IMPRESSION: Getzemani reported to PT with radicular pain in BIL glutes, that further radiated to anterior thigh with nustep at start of session. However, she responded very well to sustained lumbar extension and repeated lumbar extension in lying. She reported centralization of symptoms with new exercises and reduced painful area to lumbar spine by end of session. She is expected to begin aquatic therapy at next session. We will plan for updating home exercise program with both aquatic and land exercises over next 2 visits.      OBJECTIVE IMPAIRMENTS: decreased activity tolerance, decreased endurance, decreased ROM, improper body mechanics, postural dysfunction, obesity, and pain.   ACTIVITY LIMITATIONS: standing, squatting, stairs, and locomotion level  PARTICIPATION  LIMITATIONS: shopping, community activity, occupation, and yard work  PERSONAL FACTORS: Time since onset of injury/illness/exacerbation are also affecting patient's functional outcome.   REHAB POTENTIAL: Good  CLINICAL DECISION MAKING: Stable/uncomplicated  EVALUATION COMPLEXITY: Low   GOALS: Goals reviewed with patient? Yes  SHORT TERM GOALS: Target date: 10/13/2022  Pt to be IND with initial HEP for therapeutic progression Baseline: no previous HEP Goal status: MET  2.  Pt to be able to verbalize/ demo efficient posture and lifting mechanics to reduce and prevent  low back pain Baseline:  limited knowledge of posture Goal status: ONGOING   LONG TERM GOALS: Target date: 01/29/2023, updated 12/17/22   Maintain functional trunk mobility reporting </= 2/10 max pain at end ranges for improvement in function. Baseline:  max pain is 7/10 Goal status: MET 11/12/22  2.  Pt to be able to stand/ walk for >/= 1 hour for endurance required for ADLs and work related tasks with </= 2/10 pain Baseline: pain started with standing for 3-5 min and brisk walking 11/12/22: unable to stand >5-10 minutes without medication 12/17/22: unable to stand >5-10 minutes without medication Goal status: ONGOING  3.  Pt will be able to go up/ down >/= 2 flights of steps reporting </= min difficulty for improvement in mobility and condition.  Baseline:  Extreme difficulty at eval 11/12/22: Moderate difficulty  12/17/22: minimal difficulty, requires some breaks to complete  Goal status: MET   4.  Pt to improve FOTO score to >/= 54% to demo improvement in function Baseline: initial score 40 11/12/22: 36% 12/17/22: 37% Goal status: ONGOING  5.  Pt to be IND with all HEP and is able to maintain and progress their current LOF IND. Baseline: No previous HEP Goal status: MET  PLAN:  PT FREQUENCY: 1-2x/week  PT DURATION: 6 additional weeks, as of 12/17/22  PLANNED INTERVENTIONS: Therapeutic exercises,  Therapeutic activity, Neuromuscular re-education, Balance training, Gait training, Patient/Family education, Self Care, Joint mobilization, Joint manipulation, Aquatic Therapy, Dry Needling, Cryotherapy, Moist heat, Taping, Manual therapy, and Re-evaluation.  PLAN FOR NEXT SESSION: Continue with core strengthening, lumbar spine mobility, pain modulation/neuro regulation activities, progression towards functional strengthening. Address lifting mechanics, consider resisted walking. Add aquatic PT 1x/week, trial e-stim to lumbar spine  Mauri Reading PT, DPT 12/24/22  6:19 PM    Check all possible CPT codes: 13086 - PT Re-evaluation, 97110- Therapeutic Exercise, [phone]- Neuro Re-education, 97140 - Manual Therapy, 97530 - Therapeutic Activities, 97535 - Self Care, [phone] - Mechanical traction, and 97014 - Electrical stimulation (unattended) 28413 Aquatic Therapy   Check all conditions that are expected to impact treatment: {Conditions expected to impact treatment:None of these apply   If treatment provided at initial evaluation, no treatment charged due to lack of authorization.

## 2022-12-25 ENCOUNTER — Ambulatory Visit: Payer: Medicaid Other | Admitting: Physical Therapy

## 2023-01-01 ENCOUNTER — Ambulatory Visit: Payer: Medicaid Other

## 2023-01-01 DIAGNOSIS — M5459 Other low back pain: Secondary | ICD-10-CM | POA: Diagnosis not present

## 2023-01-01 DIAGNOSIS — M62830 Muscle spasm of back: Secondary | ICD-10-CM

## 2023-01-01 DIAGNOSIS — R293 Abnormal posture: Secondary | ICD-10-CM

## 2023-01-01 NOTE — Therapy (Addendum)
OUTPATIENT PHYSICAL THERAPY TREATMENT NOTE  PHYSICAL THERAPY DISCHARGE SUMMARY  Visits from Start of Care: 17  Current functional level related to goals / functional outcomes: See goals   Remaining deficits: Current status unknown, see assessment.   Education / Equipment: HEP, theraband, posture.    Patient agrees to discharge. Patient goals were partially met. Patient is being discharged due to not returning since the last visit.  Faith [name] PT, DPT, LAT, ATC  03/30/23  9:17 AM        Patient Name: Faith [name] MRN: [id number] DOB:[birth year], 32 y.o., female Today's Date: 01/01/2023   END OF SESSION:  PT End of Session - 01/01/23 1751     Visit Number 17    Number of Visits 27    Authorization Type [employer]    Authorization Time Period 11/26/22-01/03/23    PT Start Time 1750    PT Stop Time 1820    PT Time Calculation (min) 30 min    Activity Tolerance Patient limited by pain    Behavior During Therapy WFL for tasks assessed/performed              Past Medical History:  Diagnosis Date   Chlamydia    Hypertension    Infection    UTI   Morbid obesity (HCC)    Pulmonary embolism (HCC)    Urinary tract infection    Vaginal Pap smear, abnormal    f/u was ok   Past Surgical History:  Procedure Laterality Date   CESAREAN SECTION     COLPOSCOPY     DILATION AND EVACUATION N/A 10/04/2012   Procedure: DILATATION AND EVACUATION;  Surgeon: Lesly Dukes, MD;  Location: [facility];  Service: Gynecology;  Laterality: N/A;   LEEP  11/2016   Patient Active Problem List   Diagnosis Date Noted   Chest pain 08/16/2020   Pneumonia due to COVID-19 virus 04/23/2020   History of COVID-19 04/23/2020   Tachycardia 04/23/2020   COVID-19 virus infection 04/02/2020   Pulmonary embolism (HCC) 04/02/2020   Morbid obesity (HCC)    Abnormal Pap smear of vagina 01/31/2016    PCP: Barbette Merino, NP  REFERRING PROVIDER: Low back  pain  REFERRING DIAG: Marcene Corning, MD  Rationale for Evaluation and Treatment: Rehabilitation  THERAPY DIAG:  Other low back pain  Muscle spasm of back  Abnormal posture  ONSET DATE: October 2023  SUBJECTIVE:  SUBJECTIVE STATEMENT:  Faith [name] reports to PT with increased pain related to work activities over the past couple of days. She states that she continues to have exacerbation of symptoms whenever she has to work in the classroom or lift the younger kids. She has been working extra hours and been unable to perform update home exercise program since her last visit.   PERTINENT HISTORY:  Obesity  PAIN:  Are you having pain? Yes: NPRS scale: 5/10, At worst 7/10, at best 0/10 Pain location: cramp, heavy tightness, constant   Pain description: low back with referred to the L/R Aggravating factors: prolonged standing, prolong walking, sitting up straight Relieving factors: muscle relaxer, ibuprofen, heating  PRECAUTIONS: None  WEIGHT BEARING RESTRICTIONS: No  FALLS:  Has patient fallen in last 6 months? 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   OBJECTIVE:   DIAGNOSTIC FINDINGS:  X-ray 06/26/22 Lumbar spine  IMPRESSION: Negative.  PATIENT SURVEYS:  FOTO 40% and predicted 54%  12/17/22: 37%  SCREENING FOR RED FLAGS: Bowel or bladder incontinence: No Cauda equina syndrome: No  COGNITION: Overall cognitive status: Within functional limits for  tasks assessed     SENSATION: WFL   POSTURE: rounded shoulders and forward head  PALPATION: TTP along bil lumbar paraspinals and QL with L>R, and pain with palpation at the L SIJ/ fortins area.  LUMBAR ROM:   AROM eval 11/12/22  Flexion 70 ERP 85  Extension 20 ERP 40  Right lateral flexion 20 50  Left lateral flexion 20 50  Right rotation    Left rotation      Not assessed 12/17/22 d/t increased pain severity today    LOWER EXTREMITY MMT:    MMT Right eval Left eval Right 11/12/22 Left 11/12/22  Hip flexion 4+ 4+ 4+ 5  Hip extension 4+ 4+    Hip abduction 4+ 4 * 5 5  Hip adduction 4+ 4+ 5 5  Hip internal rotation      Hip external rotation      Knee flexion 5 4+ * 5 5  Knee extension 5 4+ 5 5  Ankle dorsiflexion      Ankle plantarflexion      Ankle inversion      Ankle eversion       (Blank rows = not tested) (* = concordant pain)  LUMBAR SPECIAL TESTS:  Quadrant test: Positive with trunk lean with ext/ L sidebend, SI Compression/distraction test: Postive, and forward flexion assessment reducted L SIJ superior movement  GAIT: Distance walked: 80 ft to treatment room Assistive device utilized: None Level of assistance: Complete Independence Comments: unremarkable.     TODAY'S TREATMENT:        [facility] Adult PT Treatment:                                                DATE: 01/01/2023  Therapeutic Exercise: Prone lying x several minutes following nustep and between exercises --- centralized and decreased symptoms Prone press ups, 3x 10  Prone knee bend, 3 x 10  Pt education  regarding purpose of new sustained and repeated lumbar extension; symptoms centralization, and use of exercises for independent pain modulation throughout the day      [facility] Adult PT Treatment:                                                DATE: 12/24/2022  Therapeutic Exercise: Nustep, level 3 x 4 minutes -- reports peripheralization of symptoms (numbness)  Prone lying x several  minutes following nustep and between exercises --- centralized and decreased symptoms Prone press ups, 3x 10  Prone knee bend, 3 x 10  Pt education regarding purpose of new sustained and repeated lumbar extension; symptoms centralization, and use of exercises for independent pain modulation throughout the day   [facility] Adult PT Treatment:                                                DATE: 12/17/2022  Therapeutic Exercise: NuStep, level 3 x 8 minutes concurrent with moist heat pack  Therapeutic Activity:  Reassessment of objective measures and subjective assessment regarding progress towards established goals, discuss plan for incorporating aquatic therapy, updated POC.  Modalities:  Moist heat pack as noted above Consider use of e-stim at next treatment session, to assess for possible benefit of home TENS unit  Plan to provide aquatic HEP if well tolerated     PATIENT EDUCATION:  Education details: evaluation findings, POC, goals, HEP with proper form.  Person educated: Patient Education method: Explanation, Verbal cues, and Handouts Education comprehension: verbalized understanding  HOME EXERCISE PROGRAM: Initial program:  Access Code: [email or web address] URL: [email or web address] Date: 09/15/2022 Prepared by: Faith [name]  Exercises - Supine Posterior Pelvic Tilt  - 1 x daily - 7 x weekly - 2 sets - 10 reps - Lower Trunk Rotation  - 1 x daily - 7 x weekly - 2 sets - 10 reps - Seated Flexion Stretch  - 3 x daily - 7 x weekly - 1-2 sets - 1-2 reps - 30 - 60 sec hold - Child's Pose Stretch  - 3 x daily - 7 x weekly - 1-2 sets - 1-2 reps - 30- 60 sec hold - Seated Hamstring Stretch  - 3 x daily - 7 x weekly - 2 sets - 2 reps - 30 hold - Supine Hamstring Stretch with Strap  - 1 x daily - 7 x weekly - 2 sets - 2 reps - 30 hold - SLR  - 1 x daily - 7 x weekly - 3 sets - 10-12 reps - 1 hold  Extension program:  Access Code: [email or web address] URL:  [email or web address] Date: 01/01/2023 Prepared by: Mauri Reading  Exercises - Prone Press Up On Elbows  - 2 x daily - 2 sets - 10 reps - 3 sec hold - Prone Hip Extension  - 2 x daily - 2 sets - 10 reps - 3 sec hold - Prone Knee Flexion  - 2 x daily - 2 sets - 10 reps - 3 sec hold - Standing Lumbar Extension with Counter  - 2 x daily - 2 sets - 10 reps - 3 sec hold - Seated Thoracic Lumbar Extension  - 2 x daily - 2 sets - 10 reps - 3 sec  hold  ASSESSMENT:  CLINICAL IMPRESSION:  Lauralynn continues to have improved therapeutic benefit with lumbar extension activities utilizing Mckenzie-based treatment approach. She continues to indicate need for skilled PT services, however d/t insurance authorization denial, patient plans to undergo appeal process in order to continue PT. She is also hoping to appeal and receive injection prior to returning to PT. Updated home exercise program to include these activities for independent self-management of symptoms. Provided patient with information for aquatic centers/pools in the local area that she can begin independent, low intensity activities for pain modulation effects while on hold form skilled PT.       OBJECTIVE IMPAIRMENTS: decreased activity tolerance, decreased endurance, decreased ROM, improper body mechanics, postural dysfunction, obesity, and pain.   ACTIVITY LIMITATIONS: standing, squatting, stairs, and locomotion level  PARTICIPATION LIMITATIONS: shopping, community activity, occupation, and yard work  PERSONAL FACTORS: Time since onset of injury/illness/exacerbation are also affecting patient's functional outcome.   REHAB POTENTIAL: Good  CLINICAL DECISION MAKING: Stable/uncomplicated  EVALUATION COMPLEXITY: Low   GOALS: Goals reviewed with patient? Yes  SHORT TERM GOALS: Target date: 10/13/2022  Pt to be IND with initial HEP for therapeutic progression Baseline: no previous HEP Goal status: MET  2.  Pt to  be able to verbalize/ demo efficient posture and lifting mechanics to reduce and prevent low back pain Baseline:  limited knowledge of posture Goal status: ONGOING   LONG TERM GOALS: Target date: 01/29/2023, updated 12/17/22   Maintain functional trunk mobility reporting </= 2/10 max pain at end ranges for improvement in function. Baseline:  max pain is 7/10 Goal status: MET 11/12/22  2.  Pt to be able to stand/ walk for >/= 1 hour for endurance required for ADLs and work related tasks with </= 2/10 pain Baseline: pain started with standing for 3-5 min and brisk walking 11/12/22: unable to stand >5-10 minutes without medication 12/17/22: unable to stand >5-10 minutes without medication Goal status: ONGOING  3.  Pt will be able to go up/ down >/= 2 flights of steps reporting </= min difficulty for improvement in mobility and condition.  Baseline:  Extreme difficulty at eval 11/12/22: Moderate difficulty  12/17/22: minimal difficulty, requires some breaks to complete  Goal status: MET   4.  Pt to improve FOTO score to >/= 54% to demo improvement in function Baseline: initial score 40 11/12/22: 36% 12/17/22: 37% Goal status: ONGOING  5.  Pt to be IND with all HEP and is able to maintain and progress their current LOF IND. Baseline: No previous HEP Goal status: MET  PLAN:  PT FREQUENCY: 1-2x/week  PT DURATION: 6 additional weeks, as of 12/17/22  PLANNED INTERVENTIONS: Therapeutic exercises, Therapeutic activity, Neuromuscular re-education, Balance training, Gait training, Patient/Family education, Self Care, Joint mobilization, Joint manipulation, Aquatic Therapy, Dry Needling, Cryotherapy, Moist heat, Taping, Manual therapy, and Re-evaluation.  PLAN FOR NEXT SESSION: Continue with core strengthening, lumbar spine mobility, pain modulation/neuro regulation activities, progression towards functional strengthening. Address lifting mechanics, consider resisted walking. Add aquatic PT  1x/week, trial e-stim to lumbar spine  Mauri Reading PT, DPT 01/02/23  1:36 PM    Check all possible CPT codes: 16109 - PT Re-evaluation, 97110- Therapeutic Exercise, [phone]- Neuro Re-education, 97140 - Manual Therapy, 97530 - Therapeutic Activities, 97535 - Self Care, [phone] - Mechanical traction, and 97014 - Electrical stimulation (unattended) 91478 Aquatic Therapy   Check all conditions that are expected to impact treatment: {Conditions expected to impact treatment:None of these apply   If treatment provided at  initial evaluation, no treatment charged due to lack of authorization.

## 2023-01-06 ENCOUNTER — Telehealth: Payer: Self-pay

## 2023-01-06 NOTE — Telephone Encounter (Signed)
Patient states she was returning a call from [place] said if you can call her back she will step away from work.

## 2023-01-14 ENCOUNTER — Ambulatory Visit: Payer: Medicaid Other

## 2023-01-15 ENCOUNTER — Ambulatory Visit: Payer: Medicaid Other

## 2023-01-20 ENCOUNTER — Ambulatory Visit: Payer: Medicaid Other

## 2023-01-31 LAB — AMB RESULTS CONSOLE CBG: Glucose: 108

## 2023-01-31 NOTE — Progress Notes (Signed)
Pt has utility SDOH insecurity.

## 2023-02-26 ENCOUNTER — Encounter: Payer: Self-pay | Admitting: *Deleted

## 2023-02-26 NOTE — Progress Notes (Signed)
Pt attended 01/31/2023 screening event where her b/p was 115/78 and her blood sugar was 108. At the event, the pt noted her PCP was John Giovanni at [facility] on [facility] and she identified a utilities insecurity. Chart review confirms pt has seen Orion Modest  (or a PCP partner provider) at [facility] at least 3 times this year, including April, July, and, most recently 01/08/23. [facility] does not document future appt in [facility] to be able to report on future appt dates. Pt was mailed her event results and a Teacher, adult education. No additional health equity team support indicated at this time.

## 2023-05-07 ENCOUNTER — Ambulatory Visit: Payer: Medicaid Other | Admitting: Radiology

## 2023-05-21 ENCOUNTER — Other Ambulatory Visit: Payer: Self-pay

## 2023-05-21 ENCOUNTER — Ambulatory Visit: Payer: Medicaid Other | Attending: Orthopaedic Surgery

## 2023-05-21 DIAGNOSIS — M5459 Other low back pain: Secondary | ICD-10-CM | POA: Diagnosis present

## 2023-05-21 NOTE — Therapy (Addendum)
OUTPATIENT PHYSICAL THERAPY THORACOLUMBAR EVALUATION   Patient Name: Faith [name] MRN: [id number] DOB:1990-11-28, 33 y.o., female Today's Date: 05/21/2023  END OF SESSION:  Visit Number 1 Number of Visits 13 Date for PT re-eval 07/03/2023  Authorization Type MCD [employer]   PT start time 1747 PT stop time 1825 PT time calculation (min) 38 min   Behavior During Therapy  WFL for tasks assessed/performed  Past Medical History:  Diagnosis Date   Chlamydia    Hypertension    Infection    UTI   Morbid obesity (HCC)    Pulmonary embolism (HCC)    Urinary tract infection    Vaginal Pap smear, abnormal    f/u was ok   Past Surgical History:  Procedure Laterality Date   CESAREAN SECTION     COLPOSCOPY     DILATION AND EVACUATION N/A 10/04/2012   Procedure: DILATATION AND EVACUATION;  Surgeon: Lesly Dukes, MD;  Location: [facility];  Service: Gynecology;  Laterality: N/A;   LEEP  11/2016   Patient Active Problem List   Diagnosis Date Noted   Chest pain 08/16/2020   Pneumonia due to COVID-19 virus 04/23/2020   History of COVID-19 04/23/2020   Tachycardia 04/23/2020   COVID-19 virus infection 04/02/2020   Pulmonary embolism (HCC) 04/02/2020   Morbid obesity (HCC)    Abnormal Pap smear of vagina 01/31/2016    PCP: Remus Loffler, PA-C  REFERRING PROVIDER: Marcene Corning, MD   REFERRING DIAG: Lumbar Spondylosis   Rationale for Evaluation and Treatment: Rehabilitation  THERAPY DIAG:  Other low back pain  ONSET DATE: 6+ months   SUBJECTIVE:                                                                                                                                                                                           SUBJECTIVE STATEMENT: Patient reports back to PT today after more than 4 months d/t difficulty with insurance authorization. She continues to have moderate-to-severe LBP with occasional radicular symptoms to proximal LE. She states that  she has not noticed any improvement in her symptoms since she was last seen in our office. 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   PERTINENT HISTORY:  Relevant PMHx including HTN, obesity, pulmonary embolism, Tachycardia  PAIN:  Are you having pain? Yes: NPRS scale: 6/10 Pain location: BIL lower back  Pain description: aching Aggravating factors: standing or walking for a long time, bending and lifting Relieving factors: OTC pain medicine, hot pack, sitting  PRECAUTIONS: None  RED FLAGS: None   WEIGHT BEARING RESTRICTIONS: No  FALLS:  Has patient fallen in last 6 months? 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   NEXT MD VISIT: unsure   OBJECTIVE:  Note: Objective measures were completed at Evaluation unless otherwise noted.  DIAGNOSTIC FINDINGS:  06/27/22: Lumbar Spine X-Ray   FINDINGS: There is no evidence of lumbar spine fracture. Alignment is normal. Intervertebral disc spaces are maintained.  PATIENT SURVEYS:  FOTO 41 current, 51 predicted  COGNITION: Overall cognitive status: Within functional limits for tasks assessed     SENSATION: Not tested  LUMBAR ROM:   AROM eval  Flexion 80%  Extension 20%  Right lateral flexion 50%  Left lateral flexion 50%  Right rotation   Left rotation    (Blank rows = not tested)  % of full AROM   LOWER EXTREMITY ROM:  grossly WFL bilaterally   LOWER EXTREMITY MMT:    MMT Right eval Left eval  Hip flexion 4+ 4+  Hip extension    Hip abduction 5 5  Hip adduction 5 5  Hip internal rotation    Hip external rotation    Knee flexion 5 5  Knee extension 5 5  Ankle dorsiflexion 5 5   Ankle plantarflexion    Ankle inversion    Ankle eversion     (Blank rows = not tested)  Trunk Flexion Strength:  2/5 MMT  TREATMENT DATE:   [facility] Adult PT Treatment:                                                DATE: 05/21/2023   Initial evaluation: see patient education and home exercise program as noted below                                                                                                                                   PATIENT EDUCATION:  Education details: reviewed initial home exercise program; discussion of POC, prognosis and goals for skilled PT   Person educated: Patient Education method: Explanation, Demonstration, and Handouts Education comprehension: verbalized understanding, returned demonstration, and needs further education  HOME EXERCISE PROGRAM: Verbal instruction - standing rows, standing pull downs, standing pallof press   ASSESSMENT:  CLINICAL IMPRESSION: [name] is a 33 y.o. female who was seen today for physical therapy evaluation and treatment for Persistent Low Back Pain with radicular symptoms. She is demonstrating diminished LS AROM, and diminished endurance of core musculature. She has related pain and difficulty with bending, lifting, prolonged standing and walking to perform ADLs, household chores, grocery  shopping and occupational duties. She requires skilled PT services at this time to address relevant deficits and improve overall function.     OBJECTIVE IMPAIRMENTS: decreased activity tolerance, decreased mobility, decreased strength, and pain.   ACTIVITY LIMITATIONS: carrying, lifting, bending, standing, bed mobility, and caring for others  PARTICIPATION LIMITATIONS: meal prep, cleaning, laundry, driving, shopping, community activity, and occupation  PERSONAL FACTORS: Time since onset of injury/illness/exacerbation and 1-2 comorbidities: Relevant PMHx including HTN, obesity, pulmonary embolism, Tachycardia  are also affecting  patient's functional outcome.   REHAB POTENTIAL: Fair    CLINICAL DECISION MAKING: Stable/uncomplicated  EVALUATION COMPLEXITY: Low   GOALS: Goals reviewed with patient? Yes  SHORT TERM GOALS: Target date: 06/11/2023   Patient will be independent with initial home program for core strengthening with neutral spine.  Baseline: written copy to be provided at f/u visit  Goal status: INITIAL  2.  Patient will demonstrate improved hip flexion strength to 5/5 MMT bilaterally   Baseline: see objective measures  Goal status: INITIAL     LONG TERM GOALS: Target date: 07/02/2023    Patient will report improved overall functional ability with FOTO score of 57 or greater  Baseline: 47 Goal status: INITIAL  2.  Patient will demonstrate ability to perform floor to waist lifting of at least 20# using appropriate body mechanics and with no more than minimal pain in order to safely perform normal daily/occupational tasks.   Baseline: unable to tolerate d/t pain severity  Goal status: INITIAL  3.  Patient will demonstrate ability to tolerate at least 20 minutes of standing or walking activities with no more than 4/10 NPRS.  Baseline: up to 7-8/10 pain  Goal status: INITIAL  4.  Patient will report perceived improvement with daily activities, by at least 50-60%.  Goal status: INITIAL   PLAN:  PT FREQUENCY: 1-2x/week  PT DURATION: 6 weeks  PLANNED INTERVENTIONS: 97164- PT Re-evaluation, 97110-Therapeutic exercises, 97530- Therapeutic activity, [id number]- Neuromuscular re-education, 97535- Self Care, 86578- Manual therapy, [id number]- Aquatic Therapy, [phone]- Electrical stimulation (manual), Patient/Family education, Taping, Dry Needling, Spinal manipulation, Spinal mobilization, Cryotherapy, and Moist heat.  PLAN FOR NEXT SESSION: core strengthening with neutral-to-extended spine, sustained or repeated LS extension exercises for pain centralization/decrease if appropriate; provide written  HEP   Mauri Reading, PT, DPT  05/27/2023 6:25 PM  For all possible CPT codes, reference the Planned Interventions line above.     Check all conditions that are expected to impact treatment: {Conditions expected to impact treatment:Social determinants of health   If treatment provided at initial evaluation, no treatment charged due to lack of authorization.

## 2023-06-10 ENCOUNTER — Ambulatory Visit: Payer: Medicaid Other | Attending: Orthopaedic Surgery

## 2023-06-10 DIAGNOSIS — M5459 Other low back pain: Secondary | ICD-10-CM | POA: Diagnosis present

## 2023-06-10 DIAGNOSIS — R293 Abnormal posture: Secondary | ICD-10-CM | POA: Insufficient documentation

## 2023-06-10 DIAGNOSIS — M62830 Muscle spasm of back: Secondary | ICD-10-CM | POA: Insufficient documentation
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
## 2023-06-13 ENCOUNTER — Ambulatory Visit: Payer: Medicaid Other

## 2023-06-13 DIAGNOSIS — R293 Abnormal posture: Secondary | ICD-10-CM

## 2023-06-13 DIAGNOSIS — M5459 Other low back pain: Secondary | ICD-10-CM

## 2023-06-13 DIAGNOSIS — M62830 Muscle spasm of back: Secondary | ICD-10-CM

## 2023-06-13 NOTE — Therapy (Signed)
 OUTPATIENT PHYSICAL THERAPY NOTE  Patient Name: Faith [name] MRN: [id number] DOB:April 05, 1991, 33 y.o., female Today's Date: 06/13/2023  END OF SESSION:  PT End of Session - 06/13/23 1018     Visit Number 3    Number of Visits 13    Date for PT Re-Evaluation 07/03/23    Authorization Type [employer]    PT Start Time [phone]    PT Stop Time 1027    PT Time Calculation (min) 35 min    Activity Tolerance Patient tolerated treatment well    Behavior During Therapy WFL for tasks assessed/performed              Past Medical History:  Diagnosis Date   Chlamydia    Hypertension    Infection    UTI   Morbid obesity (HCC)    Pulmonary embolism (HCC)    Urinary tract infection    Vaginal Pap smear, abnormal    f/u was ok   Past Surgical History:  Procedure Laterality Date   CESAREAN SECTION     COLPOSCOPY     DILATION AND EVACUATION N/A 10/04/2012   Procedure: DILATATION AND EVACUATION;  Surgeon: Burnard VEAR Pate, MD;  Location: [facility];  Service: Gynecology;  Laterality: N/A;   LEEP  11/2016   Patient Active Problem List   Diagnosis Date Noted   Chest pain 08/16/2020   Pneumonia due to COVID-19 virus 04/23/2020   History of COVID-19 04/23/2020   Tachycardia 04/23/2020   COVID-19 virus infection 04/02/2020   Pulmonary embolism (HCC) 04/02/2020   Morbid obesity (HCC)    Abnormal Pap smear of vagina 01/31/2016    PCP: Joshua Clayborne RAMAN, PA-C  REFERRING PROVIDER: Sheril Coy, MD   REFERRING DIAG: Lumbar Spondylosis   Rationale for Evaluation and Treatment: Rehabilitation  THERAPY DIAG:  Other low back pain  Muscle spasm of back  Abnormal posture  ONSET DATE: 6+ months   SUBJECTIVE:                                                                                                                                                                                           SUBJECTIVE STATEMENT: 06/13/2023 Patient reports to PT with some increased pain.  However, she states that she has been paying more attention to how she is walking and feels that it has improved.    EVAL: Patient reports back to PT today after more than 4 months d/t difficulty with insurance authorization. She continues to have moderate-to-severe LBP with occasional radicular symptoms to proximal LE. She states that she has not noticed any improvement in her symptoms since she was  last seen in our office. 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   PERTINENT HISTORY:  Relevant PMHx including HTN, obesity, pulmonary embolism, Tachycardia  PAIN:  Are you having pain? Yes: NPRS scale: 6/10 Pain location: BIL lower back  Pain description: aching Aggravating factors: standing or walking for a long time, bending and lifting Relieving factors: OTC pain medicine, hot pack, sitting  PRECAUTIONS: None  RED FLAGS: None   WEIGHT BEARING RESTRICTIONS: No  FALLS:  Has patient fallen in last 6 months? 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   NEXT MD VISIT: unsure   OBJECTIVE:  Note: Objective measures were completed at Evaluation unless otherwise noted.  DIAGNOSTIC FINDINGS:  06/27/22: Lumbar Spine X-Ray   FINDINGS: There is no evidence of lumbar spine fracture. Alignment is normal. Intervertebral disc spaces are maintained.  PATIENT SURVEYS:  FOTO 41 current, 35 predicted  COGNITION: Overall cognitive status: Within functional limits for tasks assessed     SENSATION: Not tested  LUMBAR ROM:   AROM eval  Flexion 80%  Extension 20%  Right lateral flexion 50%  Left lateral flexion 50%  Right rotation   Left rotation    (Blank  rows = not tested)  % of full AROM   LOWER EXTREMITY ROM:  grossly WFL bilaterally   LOWER EXTREMITY MMT:    MMT Right eval Left eval  Hip flexion 4+ 4+  Hip extension    Hip abduction 5 5  Hip adduction 5 5  Hip internal rotation    Hip external rotation    Knee flexion 5 5  Knee extension 5 5  Ankle dorsiflexion 5 5  Ankle plantarflexion    Ankle inversion    Ankle eversion     (Blank rows = not tested)  Trunk Flexion Strength:  2/5 MMT   TREATMENT DATE:   [facility] Adult PT Treatment:                                                DATE: 06/13/2023  Therapeutic Exercise: Core strengthening in neutral spine, concurrent with MHP  Seated pball press down for isometric core strengthening, 3 x 30 sec, 5 hold each  Seated pallof press with GTB, 2 x 10 each  Seated isometric pallof hold with varied perturbations via blue TB, 3 x 30 sec  Isometric abd walkouts with GTB, x 15 each  Standing Pallof x 10 each  Updated and reviewed HEP  Nustep x 5 minutes  [facility] Adult PT Treatment:                                                DATE: 06/10/2023  Therapeutic Exercise: Core strengthening in neutral spine, concurrent with MHP  Seated pball press down for isometric core strengthening, 3 x 30 sec, 5 hold each  Seated pallof press with GTB, 2 x 10 each  Isometric abd  walkouts with GTB, x 15 each  Updated and reviewed HEP   Therapeutic Activity: Patient education for awareness of lumbar mm guarding with walking and improved awareness of gait mechanics, addressing expectations for rehab in conjunction with recent injection.    [facility] Adult PT Treatment:                                                DATE: 05/21/2023   Initial evaluation: see patient education and home exercise program as noted below                                                                                                                                   PATIENT EDUCATION:  Education details: reviewed initial  home exercise program; discussion of POC, prognosis and goals for skilled PT   Person educated: Patient Education method: Explanation, Demonstration, and Handouts Education comprehension: verbalized understanding, returned demonstration, and needs further education  HOME EXERCISE PROGRAM: Access Code: [email or web address] URL: [email or web address] Date: 06/10/2023 Prepared by: Marko Molt  Exercises - Anti-Rotation Sidestepping with Resistance  - 1 x daily - 7 x weekly - 2 sets - 10 reps - Standing Anti-Rotation Press with Anchored Resistance  - 1 x daily - 7 x weekly - 2 sets - 10 reps - Standing March with Counter Support  - 1 x daily - 7 x weekly - 2 sets - 10 reps  ASSESSMENT:  CLINICAL IMPRESSION: 06/13/2023 Faith [name] had improved tolerance of exercises today. We were able to progress core strengthening activities with neutral spine, without exacerbation of symptoms. We will continue to progress as tolerated in order to improve core stability and mm endurance.   EVAL: Faith [name] is a 33 y.o. female who was seen today for physical therapy evaluation and treatment for Persistent Low Back Pain with radicular symptoms. She is demonstrating diminished LS AROM, and diminished endurance of core musculature. She has related pain and difficulty with bending, lifting, prolonged standing and walking to perform ADLs, household chores, grocery shopping and occupational duties. She requires skilled PT services at this time to address relevant deficits and improve overall function.     OBJECTIVE IMPAIRMENTS: decreased activity tolerance, decreased mobility, decreased strength, and pain.   ACTIVITY LIMITATIONS: carrying, lifting, bending, standing, bed mobility, and caring for others  PARTICIPATION LIMITATIONS: meal prep, cleaning, laundry, driving, shopping, community activity, and occupation  PERSONAL FACTORS: Time since onset of injury/illness/exacerbation and 1-2 comorbidities: Relevant PMHx  including HTN, obesity, pulmonary embolism, Tachycardia  are also affecting patient's functional outcome.   REHAB POTENTIAL: Fair    CLINICAL DECISION MAKING: Stable/uncomplicated  EVALUATION COMPLEXITY: Low   GOALS: Goals reviewed with patient? Yes  SHORT TERM GOALS: Target date: 06/11/2023   Patient will be independent with initial home program for core strengthening with neutral spine.  Baseline: written copy to be provided at f/u visit  Goal status: ONGOING  2.  Patient will demonstrate improved hip flexion strength to 5/5 MMT bilaterally   Baseline: see objective measures  Goal status: ONGOING    LONG TERM GOALS: Target date: 07/02/2023    Patient will report improved overall functional ability with FOTO score of 57 or greater  Baseline: 47 Goal status: INITIAL  2.  Patient will demonstrate ability to perform floor to waist lifting of at least 20# using appropriate body mechanics and with no more than minimal pain in order to safely perform normal daily/occupational tasks.   Baseline: unable to tolerate d/t pain severity  Goal status: INITIAL  3.  Patient will demonstrate ability to tolerate at least 20 minutes of standing or walking activities with no more than 4/10 NPRS.  Baseline: up to 7-8/10 pain  Goal status: INITIAL  4.  Patient will report perceived improvement with daily activities, by at least 50-60%.  Goal status: INITIAL   PLAN:  PT FREQUENCY: 1-2x/week  PT DURATION: 6 weeks  PLANNED INTERVENTIONS: 97164- PT Re-evaluation, 97110-Therapeutic exercises, 97530- Therapeutic activity, [id number]- Neuromuscular re-education, 97535- Self Care, 02859- Manual therapy, [id number]- Aquatic Therapy, [phone]- Electrical stimulation (manual), Patient/Family education, Taping, Dry Needling, Spinal manipulation, Spinal mobilization, Cryotherapy, and Moist heat.  PLAN FOR NEXT SESSION: core strengthening with neutral-to-extended spine, sustained or repeated LS extension  exercises for pain centralization/decrease if appropriate; provide written HEP   Marko Molt, PT, DPT  06/16/2023 8:43 AM  For all possible CPT codes, reference the Planned Interventions line above.     Check all conditions that are expected to impact treatment: {Conditions expected to impact treatment:Social determinants of health   If treatment provided at initial evaluation, no treatment charged due to lack of authorization.

## 2023-06-17 ENCOUNTER — Ambulatory Visit: Payer: Medicaid Other

## 2023-06-17 ENCOUNTER — Telehealth: Payer: Self-pay
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
## 2023-06-20 ENCOUNTER — Ambulatory Visit: Payer: Medicaid Other

## 2023-06-20 DIAGNOSIS — M62830 Muscle spasm of back: Secondary | ICD-10-CM

## 2023-06-20 DIAGNOSIS — R293 Abnormal posture: Secondary | ICD-10-CM

## 2023-06-20 DIAGNOSIS — M5459 Other low back pain: Secondary | ICD-10-CM

## 2023-06-20 NOTE — Therapy (Signed)
OUTPATIENT PHYSICAL THERAPY NOTE  Patient Name: Faith [name] MRN: [id number] DOB:14-Nov-1990, 33 y.o., female Today's Date: 06/20/2023  END OF SESSION:  PT End of Session - 06/20/23 0951     Visit Number 4    Number of Visits 13    Date for PT Re-Evaluation 07/03/23    Authorization Type [employer]    PT Start Time [phone]    PT Stop Time 1029    PT Time Calculation (min) 38 min    Activity Tolerance Patient tolerated treatment well    Behavior During Therapy WFL for tasks assessed/performed               Past Medical History:  Diagnosis Date   Chlamydia    Hypertension    Infection    UTI   Morbid obesity (HCC)    Pulmonary embolism (HCC)    Urinary tract infection    Vaginal Pap smear, abnormal    f/u was ok   Past Surgical History:  Procedure Laterality Date   CESAREAN SECTION     COLPOSCOPY     DILATION AND EVACUATION N/A 10/04/2012   Procedure: DILATATION AND EVACUATION;  Surgeon: Lesly Dukes, MD;  Location: [facility];  Service: Gynecology;  Laterality: N/A;   LEEP  11/2016   Patient Active Problem List   Diagnosis Date Noted   Chest pain 08/16/2020   Pneumonia due to COVID-19 virus 04/23/2020   History of COVID-19 04/23/2020   Tachycardia 04/23/2020   COVID-19 virus infection 04/02/2020   Pulmonary embolism (HCC) 04/02/2020   Morbid obesity (HCC)    Abnormal Pap smear of vagina 01/31/2016    PCP: Remus Loffler, PA-C  REFERRING PROVIDER: Marcene Corning, MD   REFERRING DIAG: Lumbar Spondylosis   Rationale for Evaluation and Treatment: Rehabilitation  THERAPY DIAG:  Other low back pain  Muscle spasm of back  Abnormal posture  ONSET DATE: 6+ months   SUBJECTIVE:                                                                                                                                                                                           SUBJECTIVE STATEMENT: 06/20/2023 Patient reports to PT with 4/10 pain this  morning.   EVAL: Patient reports back to PT today after more than 4 months d/t difficulty with insurance authorization. She continues to have moderate-to-severe LBP with occasional radicular symptoms to proximal LE. She states that she has not noticed any improvement in her symptoms since she was last seen in our office. 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   PERTINENT HISTORY:  Relevant PMHx including HTN, obesity, pulmonary embolism, Tachycardia  PAIN:  Are you having pain? Yes: NPRS scale: 6/10 Pain location: BIL lower back  Pain description: aching Aggravating factors: standing or walking for a long time, bending and lifting Relieving factors: OTC pain medicine, hot pack, sitting  PRECAUTIONS: None  RED FLAGS: None   WEIGHT BEARING RESTRICTIONS: No  FALLS:  Has patient fallen in last 6 months? 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   NEXT MD VISIT: unsure   OBJECTIVE:  Note: Objective measures were completed at Evaluation unless otherwise noted.  DIAGNOSTIC FINDINGS:  06/27/22: Lumbar Spine X-Ray   FINDINGS: There is no evidence of lumbar spine fracture. Alignment is normal. Intervertebral disc spaces are maintained.  PATIENT SURVEYS:  FOTO 41 current, 12 predicted  COGNITION: Overall cognitive status: Within functional limits for tasks assessed     SENSATION: Not tested  LUMBAR ROM:   AROM eval  Flexion 80%  Extension 20%  Right lateral flexion 50%  Left lateral flexion 50%  Right rotation   Left rotation    (Blank rows = not tested)  % of full AROM   LOWER EXTREMITY ROM:  grossly WFL bilaterally   LOWER EXTREMITY  MMT:    MMT Right eval Left eval  Hip flexion 4+ 4+  Hip extension    Hip abduction 5 5  Hip adduction 5 5  Hip internal rotation    Hip external rotation    Knee flexion 5 5  Knee extension 5 5  Ankle dorsiflexion 5 5  Ankle plantarflexion    Ankle inversion    Ankle eversion     (Blank rows = not tested)  Trunk Flexion Strength:  2/5 MMT   TREATMENT DATE:   [facility] Adult PT Treatment:                                                DATE: 06/20/2023  Therapeutic Exercise: Nustep level 4 x 6 minutes Seated pball press down for isometric core strengthening, 5" hold x 1 minute  Anti-rotation walkouts with GTB, x 15 each  Standing Pallof 2 x 10 each with GTB  Standing pull downs with red TB, 2 x10  Standing chops with GTB x 15 each side  Step Ups from airex bad to 6" step with 2.5# ankle weights  Updated and reviewed HEP   [facility] Adult PT Treatment:                                                DATE: 06/13/2023  Therapeutic Exercise: Core strengthening in neutral spine, concurrent with MHP  Seated pball press down for isometric core strengthening, 3 x 30 sec, 5" hold each  Seated pallof press with GTB, 2 x 10 each  Seated isometric pallof hold with varied perturbations via blue TB, 3 x 30 sec  Isometric abd walkouts with GTB, x 15 each  Standing Pallof x 10 each  Updated and reviewed HEP  Nustep x 5 minutes  [facility] Adult PT Treatment:                                                DATE: 06/10/2023  Therapeutic Exercise: Core strengthening in neutral spine, concurrent with MHP  Seated pball press down for isometric core strengthening, 3 x 30 sec, 5" hold each  Seated pallof press with GTB, 2 x 10 each  Isometric abd walkouts with GTB, x 15 each  Updated and reviewed HEP   Therapeutic Activity: Patient education for awareness of lumbar mm guarding with walking and improved awareness of gait mechanics, addressing expectations for rehab in conjunction with recent injection.     [facility] Adult PT Treatment:                                                DATE: 05/21/2023   Initial evaluation: see patient education and home exercise program as noted below                                                                                                                                   PATIENT EDUCATION:  Education details: reviewed initial home exercise program; discussion of POC, prognosis and goals for skilled PT   Person educated: Patient Education method: Explanation, Demonstration, and Handouts Education comprehension: verbalized understanding, returned demonstration, and needs further education  HOME EXERCISE PROGRAM: Access Code: [email or web address] URL: [email or web address] Date: 06/10/2023 Prepared by: Mauri Reading  Exercises - Anti-Rotation Sidestepping with Resistance  - 1 x daily - 7 x weekly - 2 sets - 10 reps - Standing Anti-Rotation Press with Anchored Resistance  - 1 x daily - 7 x weekly - 2 sets - 10 reps - Standing March with Counter Support  - 1 x daily - 7 x weekly - 2 sets - 10 reps  ASSESSMENT:  CLINICAL IMPRESSION: 06/20/2023 Patient has been tolerating progression of core strengthening exercises well. She had most difficulty and pain with increased weight bearing activities today. We will continue to progress towards established goals as tolerate.    EVAL: Shallen is a 33 y.o. female who was seen today for physical therapy evaluation and treatment for Persistent Low Back Pain with radicular symptoms. She is demonstrating diminished LS AROM, and diminished endurance of core musculature. She has related pain and difficulty with bending, lifting, prolonged standing and walking to perform ADLs, household chores, grocery shopping and occupational duties. She requires skilled PT services at this time to address relevant deficits and improve overall function.     OBJECTIVE IMPAIRMENTS: decreased activity tolerance, decreased mobility,  decreased  strength, and pain.   ACTIVITY LIMITATIONS: carrying, lifting, bending, standing, bed mobility, and caring for others  PARTICIPATION LIMITATIONS: meal prep, cleaning, laundry, driving, shopping, community activity, and occupation  PERSONAL FACTORS: Time since onset of injury/illness/exacerbation and 1-2 comorbidities: Relevant PMHx including HTN, obesity, pulmonary embolism, Tachycardia  are also affecting patient's functional outcome.   REHAB POTENTIAL: Fair    CLINICAL DECISION MAKING: Stable/uncomplicated  EVALUATION COMPLEXITY: Low   GOALS: Goals reviewed with patient? Yes  SHORT TERM GOALS: Target date: 06/11/2023   Patient will be independent with initial home program for core strengthening with neutral spine.  Baseline: written copy to be provided at f/u visit  Goal status: ONGOING  2.  Patient will demonstrate improved hip flexion strength to 5/5 MMT bilaterally   Baseline: see objective measures  Goal status: ONGOING    LONG TERM GOALS: Target date: 07/02/2023    Patient will report improved overall functional ability with FOTO score of 57 or greater  Baseline: 47 Goal status: INITIAL  2.  Patient will demonstrate ability to perform floor to waist lifting of at least 20# using appropriate body mechanics and with no more than minimal pain in order to safely perform normal daily/occupational tasks.   Baseline: unable to tolerate d/t pain severity  Goal status: INITIAL  3.  Patient will demonstrate ability to tolerate at least 20 minutes of standing or walking activities with no more than 4/10 NPRS.  Baseline: up to 7-8/10 pain  Goal status: INITIAL  4.  Patient will report perceived improvement with daily activities, by at least 50-60%.  Goal status: INITIAL   PLAN:  PT FREQUENCY: 1-2x/week  PT DURATION: 6 weeks  PLANNED INTERVENTIONS: 97164- PT Re-evaluation, 97110-Therapeutic exercises, 97530- Therapeutic activity, [id number]- Neuromuscular  re-education, 97535- Self Care, 16109- Manual therapy, [id number]- Aquatic Therapy, [phone]- Electrical stimulation (manual), Patient/Family education, Taping, Dry Needling, Spinal manipulation, Spinal mobilization, Cryotherapy, and Moist heat.  PLAN FOR NEXT SESSION: core strengthening with neutral-to-extended spine, sustained or repeated LS extension exercises for pain centralization/decrease if appropriate; provide written HEP   Mauri Reading, PT, DPT  06/20/2023 10:32 AM  For all possible CPT codes, reference the Planned Interventions line above.     Check all conditions that are expected to impact treatment: {Conditions expected to impact treatment:Social determinants of health   If treatment provided at initial evaluation, no treatment charged due to lack of authorization.

## 2023-06-24 ENCOUNTER — Ambulatory Visit: Payer: Medicaid Other

## 2023-06-27 ENCOUNTER — Ambulatory Visit: Payer: Medicaid Other

## 2023-07-01 ENCOUNTER — Ambulatory Visit: Payer: Medicaid Other

## 2023-07-01 DIAGNOSIS — M62830 Muscle spasm of back: Secondary | ICD-10-CM

## 2023-07-01 DIAGNOSIS — R293 Abnormal posture: Secondary | ICD-10-CM

## 2023-07-01 DIAGNOSIS — M5459 Other low back pain: Secondary | ICD-10-CM
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
## 2024-05-18 ENCOUNTER — Other Ambulatory Visit: Payer: Self-pay

## 2024-05-18 ENCOUNTER — Emergency Department (HOSPITAL_BASED_OUTPATIENT_CLINIC_OR_DEPARTMENT_OTHER): Admitting: Radiology

## 2024-05-18 ENCOUNTER — Encounter (HOSPITAL_BASED_OUTPATIENT_CLINIC_OR_DEPARTMENT_OTHER): Payer: Self-pay | Admitting: Emergency Medicine

## 2024-05-18 ENCOUNTER — Ambulatory Visit (HOSPITAL_COMMUNITY): Admission: EM | Admit: 2024-05-18 | Discharge: 2024-05-18 | Disposition: A | Discharge status: Home / Self Care

## 2024-05-18 ENCOUNTER — Emergency Department (HOSPITAL_BASED_OUTPATIENT_CLINIC_OR_DEPARTMENT_OTHER)
Admission: EM | Admit: 2024-05-18 | Discharge: 2024-05-19 | Disposition: A | Discharge status: Home / Self Care | Attending: Emergency Medicine | Admitting: Emergency Medicine

## 2024-05-18 ENCOUNTER — Encounter (HOSPITAL_COMMUNITY): Payer: Self-pay

## 2024-05-18 DIAGNOSIS — L0291 Cutaneous abscess, unspecified: Secondary | ICD-10-CM

## 2024-05-18 DIAGNOSIS — R0789 Other chest pain: Secondary | ICD-10-CM | POA: Diagnosis not present

## 2024-05-18 DIAGNOSIS — L02412 Cutaneous abscess of left axilla: Secondary | ICD-10-CM | POA: Insufficient documentation

## 2024-05-18 DIAGNOSIS — R079 Chest pain, unspecified: Secondary | ICD-10-CM | POA: Diagnosis not present

## 2024-05-18 LAB — BASIC METABOLIC PANEL WITH GFR
Anion gap: 12 (ref 5–15)
BUN: 10 mg/dL (ref 6–20)
CO2: 24 mmol/L (ref 22–32)
Calcium: 9.7 mg/dL (ref 8.9–10.3)
Chloride: 104 mmol/L (ref 98–111)
Creatinine, Ser: 0.67 mg/dL (ref 0.44–1.00)
GFR, Estimated: >60 mL/min
Glucose, Bld: 110 mg/dL — ABNORMAL HIGH (ref 70–99)
Potassium: 3.7 mmol/L (ref 3.5–5.1)
Sodium: 141 mmol/L (ref 135–145)

## 2024-05-18 LAB — CBC
HCT: 34.4 % — ABNORMAL LOW (ref 36.0–46.0)
Hemoglobin: 11.4 g/dL — ABNORMAL LOW (ref 12.0–15.0)
MCH: 26.9 pg (ref 26.0–34.0)
MCHC: 33.1 g/dL (ref 30.0–36.0)
MCV: 81.1 fL (ref 80.0–100.0)
Platelets: 370 K/uL (ref 150–400)
RBC: 4.24 MIL/uL (ref 3.87–5.11)
RDW: 14 % (ref 11.5–15.5)
WBC: 10 K/uL (ref 4.0–10.5)
nRBC: 0 % (ref 0.0–0.2)

## 2024-05-18 LAB — TROPONIN T, HIGH SENSITIVITY: Troponin T High Sensitivity: <15 ng/L (ref 0–19)

## 2024-05-18 NOTE — ED Triage Notes (Signed)
 Patient reports abscess under left arm x 6 days, chest pain that started yesterday.  Patient denies drainage, is on doxycycline  for chronic abscesses.

## 2024-05-18 NOTE — ED Provider Notes (Signed)
 " [facility]    CSN: [id number] Arrival date & time: 05/18/24  1934      History   Chief Complaint Chief Complaint  Patient presents with   Abscess    HPI Faith [name] is a 34 y.o. female.   This 34 year old female is being seen for complaints of abscess in left axilla.  She reports she has had a small abscess to the left axilla for approximately 6 days and it has rapidly progressed in the last 24 hours.  She reports abscess has grown and has now spread to the lateral side of her left upper arm.  She also complains of chest pain onset last night.  She complains of dizziness, nausea.  She denies headache, fever, chills.  She denies shortness of breath, abdominal pain, vomiting, diarrhea.   Abscess Associated symptoms: nausea   Associated symptoms: no fever, no headaches and no vomiting     Past Medical History:  Diagnosis Date   Chlamydia    Hypertension    Infection    UTI   Morbid obesity (HCC)    Pulmonary embolism (HCC)    Urinary tract infection    Vaginal Pap smear, abnormal    f/u was ok    Patient Active Problem List   Diagnosis Date Noted   Chest pain 08/16/2020   Pneumonia due to COVID-19 virus 04/23/2020   History of COVID-19 04/23/2020   Tachycardia 04/23/2020   COVID-19 virus infection 04/02/2020   Pulmonary embolism (HCC) 04/02/2020   Morbid obesity (HCC)    Abnormal Pap smear of vagina 01/31/2016    Past Surgical History:  Procedure Laterality Date   CESAREAN SECTION     COLPOSCOPY     DILATION AND EVACUATION N/A 10/04/2012   Procedure: DILATATION AND EVACUATION;  Surgeon: Burnard VEAR Pate, MD;  Location: [facility];  Service: Gynecology;  Laterality: N/A;   LEEP  11/2016    OB History     Gravida  4   Para  3   Term  3   Preterm  0   AB  1   Living  3      SAB  0   IAB  1   Ectopic  0   Multiple  0   Live Births  3            Home Medications    Prior to Admission medications  Medication Sig  Start Date End Date Taking? Authorizing Provider  acetaminophen  (TYLENOL ) 500 MG tablet Take 500 mg by mouth every 6 (six) hours as needed for moderate pain or headache.    [provider]  doxycycline  (VIBRA -TABS) 100 MG tablet Take by mouth.    [provider]  ibuprofen  (ADVIL ) 200 MG tablet Take 200 mg by mouth every 6 (six) hours as needed.    [provider]  metFORMIN  (GLUCOPHAGE ) 500 MG tablet Take 1 tablet (500 mg total) by mouth 2 (two) times daily with a meal. Patient not taking: Reported on 07/13/2020 04/11/20 07/13/20  Dennise Lavada POUR, MD    Family History Family History  Problem Relation Age of Onset   Diabetes Sister    Hypertension Paternal Grandmother    Cancer Paternal Grandfather    Anesthesia problems Neg Hx    Hearing loss Neg Hx     Social History Social History[1]   Allergies   Patient has no known allergies.   Review of Systems Review of Systems  Constitutional:  Negative for activity  change, chills and fever.  Respiratory:  Negative for shortness of breath.   Cardiovascular:  Positive for chest pain and palpitations.  Gastrointestinal:  Positive for nausea. Negative for abdominal pain, diarrhea and vomiting.  Genitourinary:  Negative for difficulty urinating.  Skin:  Positive for wound. Negative for color change.  Neurological:  Positive for dizziness. Negative for headaches.  All other systems reviewed and are negative.    Physical Exam Triage Vital Signs ED Triage Vitals  Encounter Vitals Group     BP 05/18/24 2014 122/80     Girls Systolic BP Percentile --      Girls Diastolic BP Percentile --      Boys Systolic BP Percentile --      Boys Diastolic BP Percentile --      Pulse Rate 05/18/24 2014 92     Resp 05/18/24 2014 16     Temp 05/18/24 2014 98.2 F (36.8 C)     Temp src --      SpO2 05/18/24 2014 95 %     Weight --      Height --      Head Circumference --      Peak Flow --      Pain Score 05/18/24  2012 9     Pain Loc --      Pain Education --      Exclude from Growth Chart --    No data found.  Updated Vital Signs BP 122/80 (BP Location: Right Arm)   Pulse 92   Temp 98.2 F (36.8 C)   Resp 16   LMP 05/13/2024 (Exact Date)   SpO2 95%   Visual Acuity Right Eye Distance:   Left Eye Distance:   Bilateral Distance:    Right Eye Near:   Left Eye Near:    Bilateral Near:     Physical Exam Vitals and nursing note reviewed.  Constitutional:      General: She is not in acute distress.    Appearance: She is well-developed. She is not toxic-appearing.     Comments: Pleasant female appearing stated age found sitting in chair in no acute distress.  Cardiovascular:     Rate and Rhythm: Normal rate and regular rhythm.     Heart sounds: Normal heart sounds. No murmur heard. Pulmonary:     Effort: Pulmonary effort is normal. No respiratory distress.     Breath sounds: Normal breath sounds.  Abdominal:     General: Bowel sounds are normal.     Palpations: Abdomen is soft.     Tenderness: There is no abdominal tenderness.  Skin:    General: Skin is warm and dry.     Capillary Refill: Capillary refill takes less than 2 seconds.     Findings: Abscess present.     Comments: Left axilla with induration across to lateral left upper arm.  No drainage or bleeding noted.  Neurological:     Mental Status: She is alert.  Psychiatric:        Mood and Affect: Mood normal.      UC Treatments / Results  Labs (all labs ordered are listed, but only abnormal results are displayed) Labs Reviewed - No data to display  EKG   Radiology No results found.  Procedures Procedures (including critical care time)  Medications Ordered in UC Medications - No data to display  Initial Impression / Assessment and Plan / UC Course  I have reviewed the triage vital signs and the nursing notes.  Pertinent labs & imaging results that were available during my care of the patient were reviewed  by me and considered in my medical decision making (see chart for details).     Vitals and triage reviewed, patient is hemodynamically stable.  She is advised to go to the emergency department for further evaluation of chest pain with dizziness as well as rapidly spreading abscess to left axilla.  She is agreeable with this plan.  She is stable for private transport. Final Clinical Impressions(s) / UC Diagnoses   Final diagnoses:  None     Discharge Instructions      Requires higher level of care with resources not available at urgent care.    ED Prescriptions   None    PDMP not reviewed this encounter.    [1]  Social History Tobacco Use   Smoking status: Former    Current packs/day: 0.00    Average packs/day: 0.3 packs/day    Types: Cigarettes    Quit date: 04/05/2015    Years since quitting: 9.1    Passive exposure: Never   Smokeless tobacco: Never  Vaping Use   Vaping status: Never Used  Substance Use Topics   Alcohol use: Yes    Comment: occasionally   Drug use: Never     Lennice Jon BROCKS, FNP 05/18/24 2047  "

## 2024-05-18 NOTE — ED Notes (Signed)
 Patient is being discharged from the Urgent Care and sent to the Emergency Department via POV . Per Angela,NP, patient is in need of higher level of care due to Chest pain. Patient is aware and verbalizes understanding of plan of care.  Vitals:   05/18/24 2014  BP: 122/80  Pulse: 92  Resp: 16  Temp: 98.2 F (36.8 C)  SpO2: 95%

## 2024-05-18 NOTE — Discharge Instructions (Signed)
 Requires higher level of care with resources not available at urgent care.

## 2024-05-18 NOTE — ED Triage Notes (Signed)
 Pt reports that she is has an abscess under left arm x 6 days and chest pain that yesterday. Pt reports abscess has gotten bigger over the last 24 hours. 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 Pt took her antibiotic this morning.

## 2024-05-19 MED ORDER — OXYCODONE-ACETAMINOPHEN 5-325 MG PO TABS
1.0000 | ORAL_TABLET | Freq: Once | ORAL | Status: AC
  Administered 2024-05-19: 1 via ORAL
  Filled 2024-05-19: qty 1

## 2024-05-19 MED ORDER — LIDOCAINE-EPINEPHRINE (PF) 2 %-1:200000 IJ SOLN
10.0000 mL | Freq: Once | INTRAMUSCULAR | Status: AC
  Administered 2024-05-19: 10 mL
  Filled 2024-05-19: qty 20

## 2024-05-19 MED ORDER — LIDOCAINE-EPINEPHRINE (PF) 2 %-1:200000 IJ SOLN
2.0000 mL | INTRAMUSCULAR | Status: DC
  Administered 2024-05-19: 2 mL

## 2024-05-19 NOTE — ED Provider Notes (Signed)
 "  [place] EMERGENCY DEPARTMENT AT [facility]  Provider Note  CSN: [id number] Arrival date & time: 05/18/24 2151  History Chief Complaint  Patient presents with   Abscess   Chest Pain    Faith [name] is a 34 y.o. female with history of recurrent cutaneous abscesses has had one prior abscess drained on her chest but otherwise typically manages with doxycycline  (qday for prevention and BID when she has a lesion pop up). She has had pain and swelling in her L axilla for the last week, not improving with the BID dosing. Also reports some chest wall pain starting last night. No SOB or fever.    Home Medications Prior to Admission medications  Medication Sig Start Date End Date Taking? Authorizing Provider  acetaminophen  (TYLENOL ) 500 MG tablet Take 500 mg by mouth every 6 (six) hours as needed for moderate pain or headache.    [provider]  doxycycline  (VIBRA -TABS) 100 MG tablet Take by mouth.    [provider]  ibuprofen  (ADVIL ) 200 MG tablet Take 200 mg by mouth every 6 (six) hours as needed.    [provider]  metFORMIN  (GLUCOPHAGE ) 500 MG tablet Take 1 tablet (500 mg total) by mouth 2 (two) times daily with a meal. Patient not taking: Reported on 07/13/2020 04/11/20 07/13/20  Singh, Prashant K, MD     Allergies    Patient has no known allergies.   Review of Systems   Review of Systems Please see HPI for pertinent positives and negatives  Physical Exam BP (!) 123/56   Pulse 92   Temp 97.9 F (36.6 C)   Resp 18   Ht 5' 1 (1.549 m)   Wt 104.3 kg   LMP 05/13/2024 (Exact Date)   SpO2 96%   BMI 43.46 kg/m   Physical Exam Vitals and nursing note reviewed.  Constitutional:      Appearance: Normal appearance.  HENT:     Head: Normocephalic and atraumatic.     Nose: Nose normal.     Mouth/Throat:     Mouth: Mucous membranes are moist.  Eyes:     Extraocular Movements: Extraocular movements intact.      Conjunctiva/sclera: Conjunctivae normal.  Cardiovascular:     Rate and Rhythm: Normal rate.  Pulmonary:     Effort: Pulmonary effort is normal.     Breath sounds: Normal breath sounds.  Abdominal:     General: Abdomen is flat.     Palpations: Abdomen is soft.     Tenderness: There is no abdominal tenderness.  Musculoskeletal:        General: No swelling. Normal range of motion.     Cervical back: Neck supple.  Skin:    General: Skin is warm and dry.     Comments: 3cm x 2cm area of induration, erythema and tenderness with central fluctuance in L axilla  Neurological:     General: No focal deficit present.     Mental Status: She is alert.  Psychiatric:        Mood and Affect: Mood normal.     ED Results / Procedures / Treatments   EKG EKG Interpretation Date/Time:  Wednesday May 18 2024 21:59:00 EST Ventricular Rate:  94 PR Interval:  152 QRS Duration:  96 QT Interval:  358 QTC Calculation: 447 R Axis:   66  Text Interpretation: Normal sinus rhythm Normal ECG When compared with ECG of 15-Jan-2022 19:11, PREVIOUS ECG IS PRESENT No significant change since last tracing  Confirmed by Roselyn Dunnings [phone]) on 05/19/2024 12:39:54 AM  Procedures .Incision and Drainage  Date/Time: 05/19/2024 1:28 AM  Performed by: Roselyn Dunnings NOVAK, MD Authorized by: Roselyn Dunnings NOVAK, MD   Consent:    Consent obtained:  Verbal   Consent given by:  Patient Location:    Type:  Abscess   Location:  Upper extremity   Upper extremity location: axilla. Pre-procedure details:    Skin preparation:  Povidone-iodine Anesthesia:    Anesthesia method:  Local infiltration   Local anesthetic:  2 mL lidocaine -EPINEPHrine  2 %-1:200000 Procedure type:    Complexity:  Simple Procedure details:    Incision types:  Single straight   Incision depth:  Dermal   Wound management:  Probed and deloculated   Drainage:  Purulent   Packing materials:  1/4 in iodoform gauze Post-procedure details:     Procedure completion:  Tolerated well, no immediate complications   Medications Ordered in the ED Medications  oxyCODONE -acetaminophen  (PERCOCET/ROXICET) 5-325 MG per tablet 1 tablet (1 tablet Oral Given 05/19/24 0054)  lidocaine -EPINEPHrine  (XYLOCAINE  W/EPI) 2 %-1:200000 (PF) injection 10 mL (10 mLs Infiltration Given 05/19/24 0055)    Initial Impression and Plan  Patient here for abscess in axilla, drained as above. Wound care instructions given. Also having 24+ hours of atypical chest pains. Low risk for CAD. Labs done in triage show unremarkable CBC, BMP and Trop. I personally viewed the images from radiology studies and agree with radiologist interpretation: CXR is clear. Will plan discharge with continued BID doxycycline , PCP follow up, RTED for any other concerns.    ED Course       MDM Rules/Calculators/A&P Medical Decision Making Problems Addressed: Abscess of left axilla: acute illness or injury Atypical chest pain: acute illness or injury  Amount and/or Complexity of Data Reviewed Labs: ordered. Decision-making details documented in ED Course. Radiology: ordered and independent interpretation performed. Decision-making details documented in ED Course. ECG/medicine tests: ordered and independent interpretation performed. Decision-making details documented in ED Course.  Risk Prescription drug management.     Final Clinical Impression(s) / ED Diagnoses Final diagnoses:  Abscess of left axilla  Atypical chest pain    Rx / DC Orders ED Discharge Orders     None        Roselyn Dunnings NOVAK, MD 05/19/24 0130  "
